# Patient Record
Sex: Male | Born: 1950 | Race: White | Hispanic: No | Smoking: Former smoker
Health system: Southern US, Community
[De-identification: ages and names within clinical notes are randomized; demographics above are authoritative.]

## PROBLEM LIST (undated history)

## (undated) DIAGNOSIS — R471 Dysarthria and anarthria: Secondary | ICD-10-CM

## (undated) DIAGNOSIS — M549 Dorsalgia, unspecified: Secondary | ICD-10-CM

## (undated) DIAGNOSIS — R55 Syncope and collapse: Secondary | ICD-10-CM

## (undated) DIAGNOSIS — R0602 Shortness of breath: Secondary | ICD-10-CM

## (undated) DIAGNOSIS — M5137 Other intervertebral disc degeneration, lumbosacral region: Secondary | ICD-10-CM

## (undated) DIAGNOSIS — G4761 Periodic limb movement disorder: Secondary | ICD-10-CM

## (undated) DIAGNOSIS — F1010 Alcohol abuse, uncomplicated: Secondary | ICD-10-CM

## (undated) DIAGNOSIS — F17200 Nicotine dependence, unspecified, uncomplicated: Secondary | ICD-10-CM

## (undated) DIAGNOSIS — IMO0002 Reserved for concepts with insufficient information to code with codable children: Secondary | ICD-10-CM

## (undated) DIAGNOSIS — M51379 Other intervertebral disc degeneration, lumbosacral region without mention of lumbar back pain or lower extremity pain: Secondary | ICD-10-CM

## (undated) DIAGNOSIS — Z950 Presence of cardiac pacemaker: Secondary | ICD-10-CM

## (undated) DIAGNOSIS — I5042 Chronic combined systolic (congestive) and diastolic (congestive) heart failure: Secondary | ICD-10-CM

## (undated) DIAGNOSIS — F411 Generalized anxiety disorder: Secondary | ICD-10-CM

## (undated) DIAGNOSIS — R270 Ataxia, unspecified: Secondary | ICD-10-CM

## (undated) DIAGNOSIS — L03116 Cellulitis of left lower limb: Secondary | ICD-10-CM

## (undated) DIAGNOSIS — I2510 Atherosclerotic heart disease of native coronary artery without angina pectoris: Secondary | ICD-10-CM

## (undated) DIAGNOSIS — I219 Acute myocardial infarction, unspecified: Secondary | ICD-10-CM

## (undated) DIAGNOSIS — I714 Abdominal aortic aneurysm, without rupture, unspecified: Secondary | ICD-10-CM

## (undated) DIAGNOSIS — E7800 Pure hypercholesterolemia, unspecified: Secondary | ICD-10-CM

## (undated) DIAGNOSIS — J309 Allergic rhinitis, unspecified: Secondary | ICD-10-CM

## (undated) DIAGNOSIS — J449 Chronic obstructive pulmonary disease, unspecified: Secondary | ICD-10-CM

## (undated) DIAGNOSIS — R011 Cardiac murmur, unspecified: Secondary | ICD-10-CM

## (undated) DIAGNOSIS — F319 Bipolar disorder, unspecified: Secondary | ICD-10-CM

## (undated) DIAGNOSIS — Z95810 Presence of automatic (implantable) cardiac defibrillator: Secondary | ICD-10-CM

## (undated) DIAGNOSIS — Z951 Presence of aortocoronary bypass graft: Secondary | ICD-10-CM

## (undated) DIAGNOSIS — I472 Ventricular tachycardia: Secondary | ICD-10-CM

## (undated) DIAGNOSIS — Z8719 Personal history of other diseases of the digestive system: Secondary | ICD-10-CM

## (undated) DIAGNOSIS — R569 Unspecified convulsions: Secondary | ICD-10-CM

## (undated) DIAGNOSIS — G8929 Other chronic pain: Secondary | ICD-10-CM

## (undated) DIAGNOSIS — I5040 Unspecified combined systolic (congestive) and diastolic (congestive) heart failure: Secondary | ICD-10-CM

## (undated) DIAGNOSIS — M5136 Other intervertebral disc degeneration, lumbar region: Secondary | ICD-10-CM

## (undated) DIAGNOSIS — K219 Gastro-esophageal reflux disease without esophagitis: Secondary | ICD-10-CM

## (undated) DIAGNOSIS — I10 Essential (primary) hypertension: Secondary | ICD-10-CM

## (undated) DIAGNOSIS — Z8679 Personal history of other diseases of the circulatory system: Secondary | ICD-10-CM

## (undated) DIAGNOSIS — G4710 Hypersomnia, unspecified: Secondary | ICD-10-CM

## (undated) DIAGNOSIS — R42 Dizziness and giddiness: Secondary | ICD-10-CM

## (undated) DIAGNOSIS — F1410 Cocaine abuse, uncomplicated: Secondary | ICD-10-CM

## (undated) DIAGNOSIS — I639 Cerebral infarction, unspecified: Secondary | ICD-10-CM

## (undated) DIAGNOSIS — M51369 Other intervertebral disc degeneration, lumbar region without mention of lumbar back pain or lower extremity pain: Secondary | ICD-10-CM

## (undated) DIAGNOSIS — I252 Old myocardial infarction: Secondary | ICD-10-CM

## (undated) HISTORY — DX: Atherosclerotic heart disease of native coronary artery without angina pectoris: I25.10

## (undated) HISTORY — DX: Nicotine dependence, unspecified, uncomplicated: F17.200

## (undated) HISTORY — DX: Dorsalgia, unspecified: M54.9

## (undated) HISTORY — DX: Personal history of other diseases of the digestive system: Z87.19

## (undated) HISTORY — DX: Ataxia, unspecified: R27.0

## (undated) HISTORY — DX: Other intervertebral disc degeneration, lumbar region without mention of lumbar back pain or lower extremity pain: M51.369

## (undated) HISTORY — DX: Essential (primary) hypertension: I10

## (undated) HISTORY — DX: Other intervertebral disc degeneration, lumbar region: M51.36

## (undated) HISTORY — DX: Unspecified convulsions: R56.9

## (undated) HISTORY — DX: Generalized anxiety disorder: F41.1

## (undated) HISTORY — DX: Acute myocardial infarction, unspecified: I21.9

## (undated) HISTORY — DX: Cerebral infarction, unspecified: I63.9

## (undated) HISTORY — PX: BI-VENTRICULAR IMPLANTABLE CARDIOVERTER DEFIBRILLATOR UPGRADE: SHX5749

## (undated) HISTORY — DX: Old myocardial infarction: I25.2

## (undated) HISTORY — DX: Allergic rhinitis, unspecified: J30.9

## (undated) HISTORY — DX: Cocaine abuse, uncomplicated: F14.10

## (undated) HISTORY — DX: Pure hypercholesterolemia, unspecified: E78.00

## (undated) HISTORY — DX: Periodic limb movement disorder: G47.61

## (undated) HISTORY — DX: Dizziness and giddiness: R42

## (undated) HISTORY — DX: Other intervertebral disc degeneration, lumbosacral region: M51.37

## (undated) HISTORY — PX: CERVICAL FUSION: SHX112

## (undated) HISTORY — DX: Ventricular tachycardia: I47.2

## (undated) HISTORY — DX: Alcohol abuse, uncomplicated: F10.10

## (undated) HISTORY — DX: Syncope and collapse: R55

## (undated) HISTORY — PX: LUMBAR FUSION: SHX111

## (undated) HISTORY — DX: Other intervertebral disc degeneration, lumbosacral region without mention of lumbar back pain or lower extremity pain: M51.379

## (undated) HISTORY — DX: Abdominal aortic aneurysm, without rupture: I71.4

## (undated) HISTORY — DX: Reserved for concepts with insufficient information to code with codable children: IMO0002

## (undated) HISTORY — PX: BACK SURGERY: SHX140

## (undated) HISTORY — PX: CARDIAC CATHETERIZATION: SHX172

## (undated) HISTORY — DX: Abdominal aortic aneurysm, without rupture, unspecified: I71.40

## (undated) HISTORY — DX: Presence of automatic (implantable) cardiac defibrillator: Z95.810

## (undated) HISTORY — DX: Hypersomnia, unspecified: G47.10

## (undated) HISTORY — DX: Presence of aortocoronary bypass graft: Z95.1

## (undated) HISTORY — DX: Bipolar disorder, unspecified: F31.9

## (undated) HISTORY — DX: Dysarthria and anarthria: R47.1

## (undated) HISTORY — DX: Other chronic pain: G89.29

## (undated) HISTORY — DX: Personal history of other diseases of the circulatory system: Z86.79

---

## 1994-10-11 DIAGNOSIS — Z951 Presence of aortocoronary bypass graft: Secondary | ICD-10-CM

## 1994-10-11 HISTORY — DX: Presence of aortocoronary bypass graft: Z95.1

## 2002-09-15 ENCOUNTER — Emergency Department (HOSPITAL_COMMUNITY): Admission: EM | Admit: 2002-09-15 | Discharge: 2002-09-15 | Payer: Self-pay | Admitting: Emergency Medicine

## 2003-08-02 ENCOUNTER — Encounter: Payer: Self-pay | Admitting: Emergency Medicine

## 2003-08-02 ENCOUNTER — Emergency Department (HOSPITAL_COMMUNITY): Admission: EM | Admit: 2003-08-02 | Discharge: 2003-08-02 | Payer: Self-pay | Admitting: Emergency Medicine

## 2005-10-11 HISTORY — PX: CORONARY ARTERY BYPASS GRAFT: SHX141

## 2006-02-25 ENCOUNTER — Encounter: Payer: Self-pay | Admitting: Vascular Surgery

## 2006-02-25 ENCOUNTER — Emergency Department (HOSPITAL_COMMUNITY): Admission: EM | Admit: 2006-02-25 | Discharge: 2006-02-25 | Payer: Self-pay | Admitting: Emergency Medicine

## 2006-03-01 ENCOUNTER — Emergency Department (HOSPITAL_COMMUNITY): Admission: EM | Admit: 2006-03-01 | Discharge: 2006-03-02 | Payer: Self-pay | Admitting: Emergency Medicine

## 2006-05-13 ENCOUNTER — Ambulatory Visit: Payer: Self-pay | Admitting: *Deleted

## 2006-05-13 ENCOUNTER — Inpatient Hospital Stay (HOSPITAL_COMMUNITY): Admission: EM | Admit: 2006-05-13 | Discharge: 2006-05-18 | Payer: Self-pay | Admitting: Emergency Medicine

## 2006-05-14 HISTORY — PX: CARDIAC CATHETERIZATION: SHX172

## 2006-05-16 ENCOUNTER — Encounter: Payer: Self-pay | Admitting: Cardiology

## 2006-05-17 ENCOUNTER — Ambulatory Visit: Payer: Self-pay | Admitting: Internal Medicine

## 2006-05-23 ENCOUNTER — Ambulatory Visit: Payer: Self-pay | Admitting: Internal Medicine

## 2006-06-07 ENCOUNTER — Ambulatory Visit: Payer: Self-pay | Admitting: Cardiology

## 2006-06-10 ENCOUNTER — Ambulatory Visit: Payer: Self-pay | Admitting: Cardiology

## 2006-06-10 ENCOUNTER — Encounter (HOSPITAL_COMMUNITY): Admission: RE | Admit: 2006-06-10 | Discharge: 2006-06-22 | Payer: Self-pay | Admitting: Cardiology

## 2006-06-13 ENCOUNTER — Emergency Department (HOSPITAL_COMMUNITY): Admission: EM | Admit: 2006-06-13 | Discharge: 2006-06-13 | Payer: Self-pay | Admitting: Emergency Medicine

## 2006-06-16 ENCOUNTER — Ambulatory Visit: Payer: Self-pay | Admitting: Cardiology

## 2006-06-16 ENCOUNTER — Ambulatory Visit: Payer: Self-pay | Admitting: Internal Medicine

## 2006-06-20 ENCOUNTER — Ambulatory Visit: Payer: Self-pay | Admitting: Internal Medicine

## 2006-06-20 ENCOUNTER — Inpatient Hospital Stay (HOSPITAL_COMMUNITY): Admission: EM | Admit: 2006-06-20 | Discharge: 2006-06-22 | Payer: Self-pay | Admitting: Emergency Medicine

## 2006-06-23 ENCOUNTER — Ambulatory Visit: Payer: Self-pay | Admitting: *Deleted

## 2006-06-30 ENCOUNTER — Ambulatory Visit: Payer: Self-pay | Admitting: Family Medicine

## 2006-07-12 ENCOUNTER — Ambulatory Visit: Payer: Self-pay | Admitting: Cardiology

## 2006-11-08 ENCOUNTER — Ambulatory Visit: Payer: Self-pay | Admitting: Family Medicine

## 2006-12-09 ENCOUNTER — Ambulatory Visit: Payer: Self-pay | Admitting: *Deleted

## 2007-01-06 ENCOUNTER — Ambulatory Visit: Payer: Self-pay | Admitting: Family Medicine

## 2007-03-15 ENCOUNTER — Ambulatory Visit: Payer: Self-pay | Admitting: Family Medicine

## 2007-03-15 LAB — CONVERTED CEMR LAB: PSA: 0.41 ng/mL

## 2007-03-16 ENCOUNTER — Encounter (INDEPENDENT_AMBULATORY_CARE_PROVIDER_SITE_OTHER): Payer: Self-pay | Admitting: Family Medicine

## 2007-03-16 LAB — CONVERTED CEMR LAB: RBC count: 5.29 10*6/uL

## 2007-03-27 ENCOUNTER — Ambulatory Visit (HOSPITAL_COMMUNITY): Admission: RE | Admit: 2007-03-27 | Discharge: 2007-03-27 | Payer: Self-pay | Admitting: Family Medicine

## 2007-05-03 ENCOUNTER — Emergency Department (HOSPITAL_COMMUNITY): Admission: EM | Admit: 2007-05-03 | Discharge: 2007-05-03 | Payer: Self-pay | Admitting: Family Medicine

## 2007-05-08 ENCOUNTER — Emergency Department (HOSPITAL_COMMUNITY): Admission: EM | Admit: 2007-05-08 | Discharge: 2007-05-08 | Payer: Self-pay | Admitting: Emergency Medicine

## 2007-05-09 ENCOUNTER — Ambulatory Visit: Payer: Self-pay | Admitting: Family Medicine

## 2007-06-08 ENCOUNTER — Encounter: Payer: Self-pay | Admitting: Family Medicine

## 2007-06-08 DIAGNOSIS — M51379 Other intervertebral disc degeneration, lumbosacral region without mention of lumbar back pain or lower extremity pain: Secondary | ICD-10-CM | POA: Insufficient documentation

## 2007-06-08 DIAGNOSIS — M5137 Other intervertebral disc degeneration, lumbosacral region: Secondary | ICD-10-CM

## 2007-06-08 DIAGNOSIS — I2510 Atherosclerotic heart disease of native coronary artery without angina pectoris: Secondary | ICD-10-CM

## 2007-06-08 DIAGNOSIS — I10 Essential (primary) hypertension: Secondary | ICD-10-CM

## 2007-06-08 DIAGNOSIS — Z8719 Personal history of other diseases of the digestive system: Secondary | ICD-10-CM

## 2007-06-08 DIAGNOSIS — I252 Old myocardial infarction: Secondary | ICD-10-CM | POA: Insufficient documentation

## 2007-06-08 HISTORY — DX: Essential (primary) hypertension: I10

## 2007-06-08 HISTORY — DX: Old myocardial infarction: I25.2

## 2007-06-08 HISTORY — DX: Atherosclerotic heart disease of native coronary artery without angina pectoris: I25.10

## 2007-06-09 ENCOUNTER — Ambulatory Visit: Payer: Self-pay | Admitting: Family Medicine

## 2007-06-15 DIAGNOSIS — F17200 Nicotine dependence, unspecified, uncomplicated: Secondary | ICD-10-CM | POA: Insufficient documentation

## 2007-06-28 ENCOUNTER — Encounter (INDEPENDENT_AMBULATORY_CARE_PROVIDER_SITE_OTHER): Payer: Self-pay | Admitting: *Deleted

## 2007-07-20 ENCOUNTER — Emergency Department (HOSPITAL_COMMUNITY): Admission: EM | Admit: 2007-07-20 | Discharge: 2007-07-20 | Payer: Self-pay | Admitting: Emergency Medicine

## 2007-08-04 ENCOUNTER — Ambulatory Visit: Payer: Self-pay | Admitting: Family Medicine

## 2007-09-04 ENCOUNTER — Ambulatory Visit: Payer: Self-pay | Admitting: Family Medicine

## 2007-09-29 ENCOUNTER — Ambulatory Visit: Payer: Self-pay | Admitting: Internal Medicine

## 2007-10-18 ENCOUNTER — Inpatient Hospital Stay (HOSPITAL_COMMUNITY): Admission: EM | Admit: 2007-10-18 | Discharge: 2007-10-21 | Payer: Self-pay | Admitting: Emergency Medicine

## 2007-10-18 ENCOUNTER — Ambulatory Visit: Payer: Self-pay | Admitting: Cardiology

## 2007-10-23 ENCOUNTER — Ambulatory Visit: Payer: Self-pay | Admitting: Internal Medicine

## 2007-10-23 ENCOUNTER — Ambulatory Visit (HOSPITAL_COMMUNITY): Admission: RE | Admit: 2007-10-23 | Discharge: 2007-10-24 | Payer: Self-pay | Admitting: Internal Medicine

## 2007-10-23 HISTORY — PX: CARDIAC DEFIBRILLATOR PLACEMENT: SHX171

## 2007-11-06 ENCOUNTER — Inpatient Hospital Stay (HOSPITAL_COMMUNITY): Admission: EM | Admit: 2007-11-06 | Discharge: 2007-11-08 | Payer: Self-pay | Admitting: Emergency Medicine

## 2007-11-06 ENCOUNTER — Ambulatory Visit: Payer: Self-pay | Admitting: Cardiology

## 2007-11-07 ENCOUNTER — Encounter: Payer: Self-pay | Admitting: Cardiology

## 2007-11-08 ENCOUNTER — Encounter: Payer: Self-pay | Admitting: Internal Medicine

## 2007-11-17 ENCOUNTER — Ambulatory Visit: Payer: Self-pay | Admitting: Cardiology

## 2007-11-23 ENCOUNTER — Ambulatory Visit: Payer: Self-pay | Admitting: Family Medicine

## 2007-12-07 ENCOUNTER — Inpatient Hospital Stay (HOSPITAL_COMMUNITY): Admission: EM | Admit: 2007-12-07 | Discharge: 2007-12-10 | Payer: Self-pay | Admitting: Emergency Medicine

## 2007-12-07 ENCOUNTER — Ambulatory Visit: Payer: Self-pay | Admitting: Cardiology

## 2007-12-13 ENCOUNTER — Ambulatory Visit: Payer: Self-pay | Admitting: Cardiology

## 2007-12-13 LAB — CONVERTED CEMR LAB
BUN: 15 mg/dL (ref 6–23)
Calcium: 9.4 mg/dL (ref 8.4–10.5)
Eosinophils Absolute: 0.4 10*3/uL (ref 0.0–0.6)
GFR calc Af Amer: 90 mL/min
GFR calc non Af Amer: 74 mL/min
Lymphocytes Relative: 29.1 % (ref 12.0–46.0)
MCV: 91.6 fL (ref 78.0–100.0)
Monocytes Relative: 6.1 % (ref 3.0–11.0)
Neutro Abs: 5.2 10*3/uL (ref 1.4–7.7)
Platelets: 232 10*3/uL (ref 150–400)
RBC: 5.08 M/uL (ref 4.22–5.81)

## 2007-12-15 ENCOUNTER — Ambulatory Visit: Payer: Self-pay | Admitting: Family Medicine

## 2008-01-11 ENCOUNTER — Ambulatory Visit: Payer: Self-pay | Admitting: Family Medicine

## 2008-01-22 ENCOUNTER — Ambulatory Visit: Payer: Self-pay | Admitting: Cardiology

## 2008-01-22 LAB — CONVERTED CEMR LAB
ALT: 14 units/L (ref 0–53)
BUN: 4 mg/dL — ABNORMAL LOW (ref 6–23)
Bilirubin, Direct: 0.1 mg/dL (ref 0.0–0.3)
CO2: 28 meq/L (ref 19–32)
Calcium: 9.2 mg/dL (ref 8.4–10.5)
Cholesterol: 171 mg/dL (ref 0–200)
Creatinine, Ser: 0.9 mg/dL (ref 0.4–1.5)
Glucose, Bld: 78 mg/dL (ref 70–99)
HDL: 35.2 mg/dL — ABNORMAL LOW (ref >39.0)
Total Protein: 6.1 g/dL (ref 6.0–8.3)
Triglycerides: 62 mg/dL (ref 0–149)

## 2008-02-01 ENCOUNTER — Ambulatory Visit: Payer: Self-pay | Admitting: Internal Medicine

## 2008-02-09 ENCOUNTER — Encounter (INDEPENDENT_AMBULATORY_CARE_PROVIDER_SITE_OTHER): Payer: Self-pay | Admitting: Family Medicine

## 2008-02-09 ENCOUNTER — Ambulatory Visit: Payer: Self-pay | Admitting: Internal Medicine

## 2008-02-09 LAB — CONVERTED CEMR LAB
Benzodiazepines.: NEGATIVE
Creatinine,U: 100.8 mg/dL
Opiate Screen, Urine: NEGATIVE
Phencyclidine (PCP): NEGATIVE
Propoxyphene: NEGATIVE

## 2008-02-15 ENCOUNTER — Ambulatory Visit: Payer: Self-pay | Admitting: Pulmonary Disease

## 2008-02-15 DIAGNOSIS — G4710 Hypersomnia, unspecified: Secondary | ICD-10-CM | POA: Insufficient documentation

## 2008-02-15 DIAGNOSIS — R55 Syncope and collapse: Secondary | ICD-10-CM

## 2008-02-22 ENCOUNTER — Ambulatory Visit: Payer: Self-pay | Admitting: Pulmonary Disease

## 2008-02-22 ENCOUNTER — Ambulatory Visit (HOSPITAL_BASED_OUTPATIENT_CLINIC_OR_DEPARTMENT_OTHER): Admission: RE | Admit: 2008-02-22 | Discharge: 2008-02-22 | Payer: Self-pay | Admitting: Pulmonary Disease

## 2008-02-28 ENCOUNTER — Ambulatory Visit: Payer: Self-pay | Admitting: Pulmonary Disease

## 2008-02-28 DIAGNOSIS — G4761 Periodic limb movement disorder: Secondary | ICD-10-CM

## 2008-02-28 LAB — CONVERTED CEMR LAB
AST: 18 units/L (ref 0–37)
Alkaline Phosphatase: 96 units/L (ref 39–117)
Bilirubin, Direct: 0.1 mg/dL (ref 0.0–0.3)
Chloride: 106 meq/L (ref 96–112)
Ferritin: 29.2 ng/mL (ref 22.0–322.0)
Folate: 5.9 ng/mL
GFR calc non Af Amer: 83 mL/min
Hemoglobin: 14.9 g/dL (ref 13.0–17.0)
Lymphocytes Relative: 17.4 % (ref 12.0–46.0)
Monocytes Relative: 3.9 % (ref 3.0–12.0)
Neutro Abs: 6.5 10*3/uL (ref 1.4–7.7)
Potassium: 3.4 meq/L — ABNORMAL LOW (ref 3.5–5.1)
RDW: 12.4 % (ref 11.5–14.6)
Sodium: 141 meq/L (ref 135–145)
TSH: 0.7 microintl units/mL (ref 0.35–5.50)
Total Bilirubin: 0.6 mg/dL (ref 0.3–1.2)
Vitamin B-12: 145 pg/mL — ABNORMAL LOW (ref 211–911)

## 2008-03-08 ENCOUNTER — Ambulatory Visit: Payer: Self-pay | Admitting: Family Medicine

## 2008-03-08 LAB — CONVERTED CEMR LAB
Marijuana Metabolite: NEGATIVE
Methadone: NEGATIVE
Opiate Screen, Urine: NEGATIVE
Propoxyphene: NEGATIVE

## 2008-03-19 ENCOUNTER — Ambulatory Visit: Payer: Self-pay | Admitting: Internal Medicine

## 2008-03-25 ENCOUNTER — Ambulatory Visit: Payer: Self-pay | Admitting: Cardiology

## 2008-03-25 ENCOUNTER — Inpatient Hospital Stay (HOSPITAL_COMMUNITY): Admission: EM | Admit: 2008-03-25 | Discharge: 2008-03-27 | Payer: Self-pay | Admitting: Emergency Medicine

## 2008-03-26 ENCOUNTER — Encounter: Payer: Self-pay | Admitting: Cardiology

## 2008-03-26 ENCOUNTER — Ambulatory Visit: Payer: Self-pay | Admitting: Vascular Surgery

## 2008-04-03 ENCOUNTER — Encounter (INDEPENDENT_AMBULATORY_CARE_PROVIDER_SITE_OTHER): Payer: Self-pay | Admitting: Family Medicine

## 2008-04-03 ENCOUNTER — Ambulatory Visit: Payer: Self-pay | Admitting: Internal Medicine

## 2008-04-03 LAB — CONVERTED CEMR LAB
Barbiturate Quant, Ur: NEGATIVE
Cocaine Metabolites: NEGATIVE
Creatinine,U: 137.3 mg/dL
Marijuana Metabolite: NEGATIVE
Opiate Screen, Urine: NEGATIVE
Propoxyphene: NEGATIVE

## 2008-05-02 ENCOUNTER — Ambulatory Visit: Payer: Self-pay | Admitting: Cardiology

## 2008-05-07 ENCOUNTER — Encounter (INDEPENDENT_AMBULATORY_CARE_PROVIDER_SITE_OTHER): Payer: Self-pay | Admitting: Family Medicine

## 2008-05-07 ENCOUNTER — Ambulatory Visit: Payer: Self-pay | Admitting: Internal Medicine

## 2008-05-07 LAB — CONVERTED CEMR LAB
Amphetamine Screen, Ur: NEGATIVE
Barbiturate Quant, Ur: NEGATIVE
Methadone: NEGATIVE
Opiate Screen, Urine: NEGATIVE

## 2008-06-14 ENCOUNTER — Emergency Department (HOSPITAL_COMMUNITY): Admission: EM | Admit: 2008-06-14 | Discharge: 2008-06-14 | Payer: Self-pay | Admitting: Emergency Medicine

## 2008-08-22 ENCOUNTER — Emergency Department (HOSPITAL_COMMUNITY): Admission: EM | Admit: 2008-08-22 | Discharge: 2008-08-22 | Payer: Self-pay | Admitting: Emergency Medicine

## 2008-08-23 ENCOUNTER — Ambulatory Visit: Payer: Self-pay | Admitting: Family Medicine

## 2008-08-23 LAB — CONVERTED CEMR LAB
Barbiturate Quant, Ur: NEGATIVE
Benzodiazepines.: NEGATIVE
Marijuana Metabolite: NEGATIVE
Methadone: NEGATIVE
Propoxyphene: NEGATIVE

## 2008-08-29 ENCOUNTER — Ambulatory Visit: Payer: Self-pay | Admitting: Cardiology

## 2008-08-29 LAB — CONVERTED CEMR LAB
Basophils Absolute: 0 10*3/uL (ref 0.0–0.1)
Calcium: 9.3 mg/dL (ref 8.4–10.5)
Eosinophils Absolute: 0.4 10*3/uL (ref 0.0–0.7)
GFR calc Af Amer: 100 mL/min
GFR calc non Af Amer: 82 mL/min
HCT: 43.5 % (ref 39.0–52.0)
INR: 0.9 (ref 0.8–1.0)
MCHC: 34.1 g/dL (ref 30.0–36.0)
MCV: 92.7 fL (ref 78.0–100.0)
Monocytes Absolute: 0.5 10*3/uL (ref 0.1–1.0)
Neutro Abs: 6.5 10*3/uL (ref 1.4–7.7)
Platelets: 236 10*3/uL (ref 150–400)
Potassium: 3.6 meq/L (ref 3.5–5.1)
RDW: 12.5 % (ref 11.5–14.6)
Sodium: 138 meq/L (ref 135–145)

## 2008-09-03 ENCOUNTER — Ambulatory Visit (HOSPITAL_COMMUNITY): Admission: RE | Admit: 2008-09-03 | Discharge: 2008-09-03 | Payer: Self-pay | Admitting: Cardiology

## 2008-09-03 ENCOUNTER — Ambulatory Visit: Payer: Self-pay | Admitting: Cardiology

## 2008-09-03 HISTORY — PX: CARDIAC CATHETERIZATION: SHX172

## 2008-10-21 ENCOUNTER — Ambulatory Visit: Payer: Self-pay | Admitting: Internal Medicine

## 2008-10-21 ENCOUNTER — Inpatient Hospital Stay (HOSPITAL_COMMUNITY): Admission: EM | Admit: 2008-10-21 | Discharge: 2008-10-23 | Payer: Self-pay | Admitting: Emergency Medicine

## 2008-10-25 ENCOUNTER — Ambulatory Visit: Payer: Self-pay | Admitting: Family Medicine

## 2009-01-12 ENCOUNTER — Emergency Department (HOSPITAL_COMMUNITY): Admission: EM | Admit: 2009-01-12 | Discharge: 2009-01-13 | Payer: Self-pay | Admitting: Emergency Medicine

## 2009-01-19 ENCOUNTER — Emergency Department (HOSPITAL_COMMUNITY): Admission: EM | Admit: 2009-01-19 | Discharge: 2009-01-20 | Payer: Self-pay | Admitting: *Deleted

## 2009-01-24 ENCOUNTER — Ambulatory Visit: Payer: Self-pay | Admitting: Family Medicine

## 2009-01-24 LAB — CONVERTED CEMR LAB
Cocaine Metabolites: POSITIVE — AB
Creatinine,U: 132.2 mg/dL
Phencyclidine (PCP): NEGATIVE
Propoxyphene: NEGATIVE

## 2009-02-04 ENCOUNTER — Inpatient Hospital Stay (HOSPITAL_COMMUNITY): Admission: EM | Admit: 2009-02-04 | Discharge: 2009-02-06 | Payer: Self-pay | Admitting: *Deleted

## 2009-02-04 ENCOUNTER — Encounter: Payer: Self-pay | Admitting: Cardiology

## 2009-02-04 ENCOUNTER — Ambulatory Visit: Payer: Self-pay | Admitting: Cardiology

## 2009-02-05 ENCOUNTER — Encounter: Payer: Self-pay | Admitting: Cardiology

## 2009-03-01 ENCOUNTER — Emergency Department (HOSPITAL_COMMUNITY): Admission: EM | Admit: 2009-03-01 | Discharge: 2009-03-01 | Payer: Self-pay | Admitting: Emergency Medicine

## 2009-03-04 ENCOUNTER — Ambulatory Visit: Payer: Self-pay | Admitting: Internal Medicine

## 2009-03-06 DIAGNOSIS — E7800 Pure hypercholesterolemia, unspecified: Secondary | ICD-10-CM

## 2009-03-06 DIAGNOSIS — M549 Dorsalgia, unspecified: Secondary | ICD-10-CM

## 2009-03-06 DIAGNOSIS — F319 Bipolar disorder, unspecified: Secondary | ICD-10-CM | POA: Insufficient documentation

## 2009-03-06 HISTORY — DX: Pure hypercholesterolemia, unspecified: E78.00

## 2009-03-19 ENCOUNTER — Ambulatory Visit: Payer: Self-pay | Admitting: Internal Medicine

## 2009-03-19 ENCOUNTER — Emergency Department (HOSPITAL_COMMUNITY): Admission: EM | Admit: 2009-03-19 | Discharge: 2009-03-19 | Payer: Self-pay | Admitting: Emergency Medicine

## 2009-04-01 ENCOUNTER — Emergency Department (HOSPITAL_COMMUNITY): Admission: EM | Admit: 2009-04-01 | Discharge: 2009-04-01 | Payer: Self-pay | Admitting: Emergency Medicine

## 2009-04-28 ENCOUNTER — Emergency Department (HOSPITAL_COMMUNITY): Admission: EM | Admit: 2009-04-28 | Discharge: 2009-04-28 | Payer: Self-pay | Admitting: Emergency Medicine

## 2009-04-30 ENCOUNTER — Encounter (INDEPENDENT_AMBULATORY_CARE_PROVIDER_SITE_OTHER): Payer: Self-pay | Admitting: *Deleted

## 2009-05-28 ENCOUNTER — Ambulatory Visit: Payer: Self-pay | Admitting: Cardiology

## 2009-05-28 ENCOUNTER — Inpatient Hospital Stay (HOSPITAL_COMMUNITY): Admission: EM | Admit: 2009-05-28 | Discharge: 2009-05-29 | Payer: Self-pay | Admitting: Emergency Medicine

## 2009-08-01 ENCOUNTER — Encounter (INDEPENDENT_AMBULATORY_CARE_PROVIDER_SITE_OTHER): Payer: Self-pay | Admitting: *Deleted

## 2009-08-12 ENCOUNTER — Encounter (INDEPENDENT_AMBULATORY_CARE_PROVIDER_SITE_OTHER): Payer: Self-pay | Admitting: *Deleted

## 2009-08-17 ENCOUNTER — Encounter: Payer: Self-pay | Admitting: Cardiology

## 2009-09-24 IMAGING — CT CT HEAD W/O CM
1 series · 16 of 30 positions shown, 20 images · IV contrast (agent unspecified)
Comparison: CT head dated 10/19/07, MRI brain dated 10/21/07.

CLINICAL DATA: Code stroke.  Left-sided pain. Slurred speech. 
HEAD CT WITHOUT CONTRAST:
TECHNIQUE: Contiguous axial images were obtained from the base of the skull through the vertex according to standard protocol without contrast.

[Series 2: brain · axial · 0.47mm/px · z∈[+97,+243]mm · 16 of 32 slices shown, 20 images]
[im 2/32  brain]
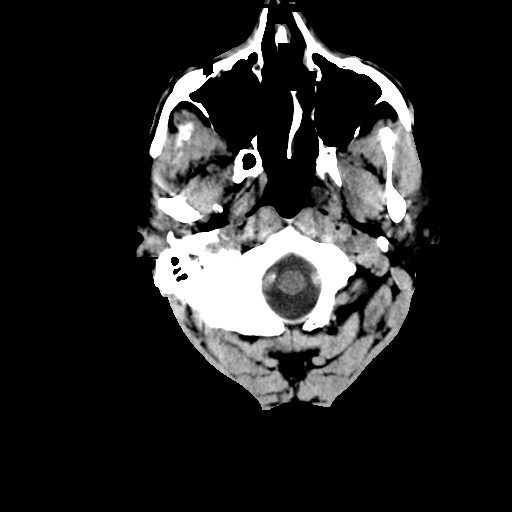
[im 2/32  bone]
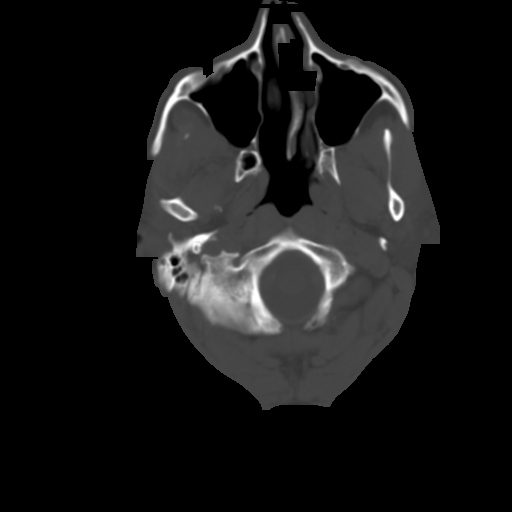
[im 4/32  brain]
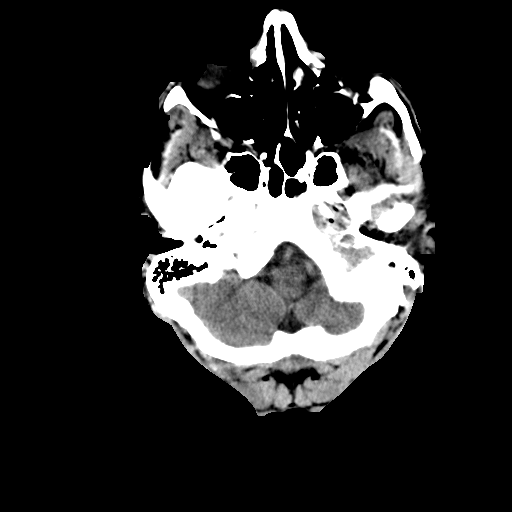
[im 6/32  brain]
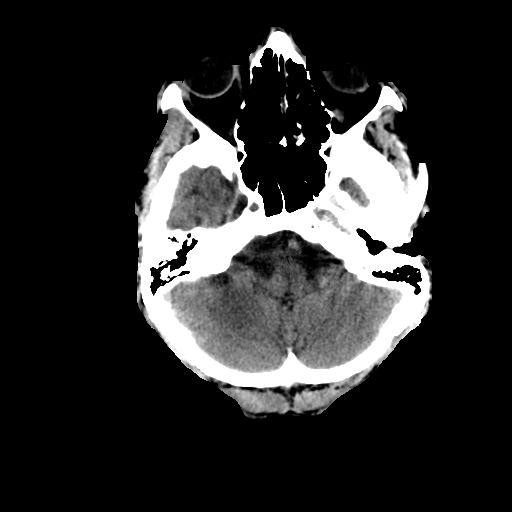
[im 8/32  brain]
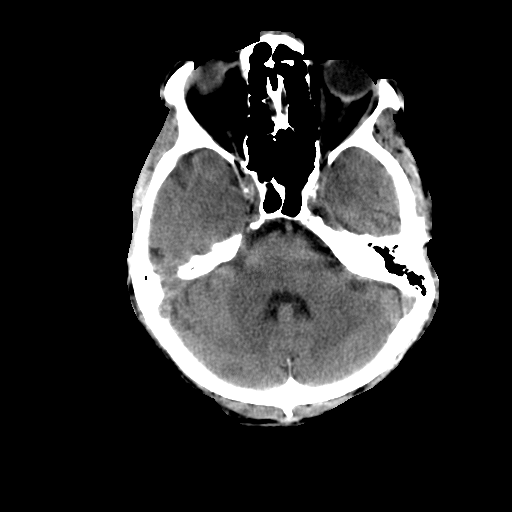
[im 9/32  brain]
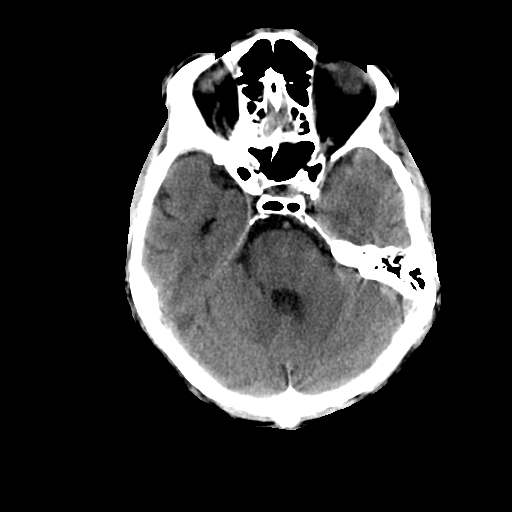
[im 9/32  bone]
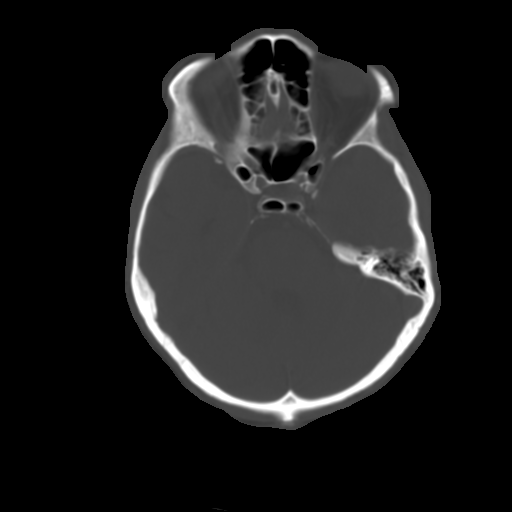
[im 11/32  brain]
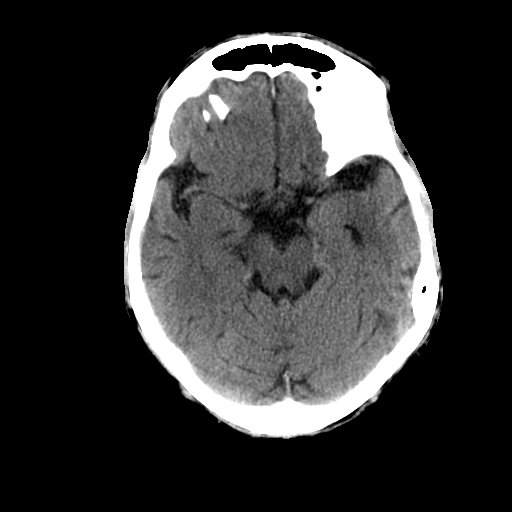
[im 13/32  brain]
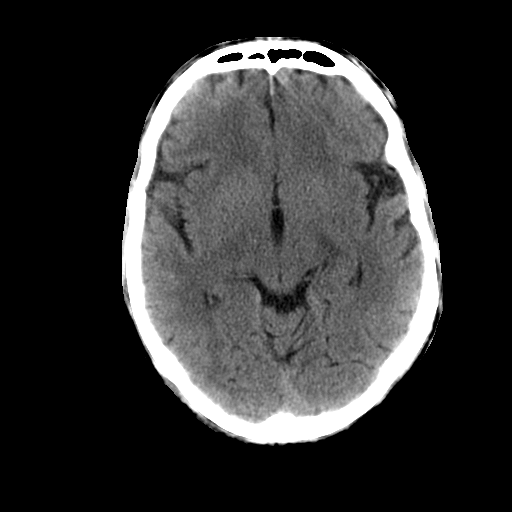
[im 15/32  brain]
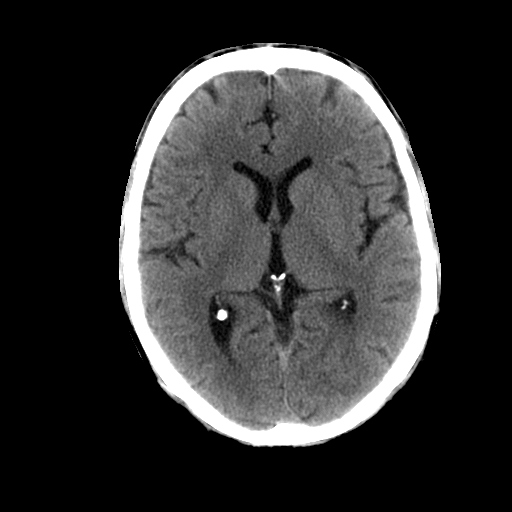
[im 17/32  brain]
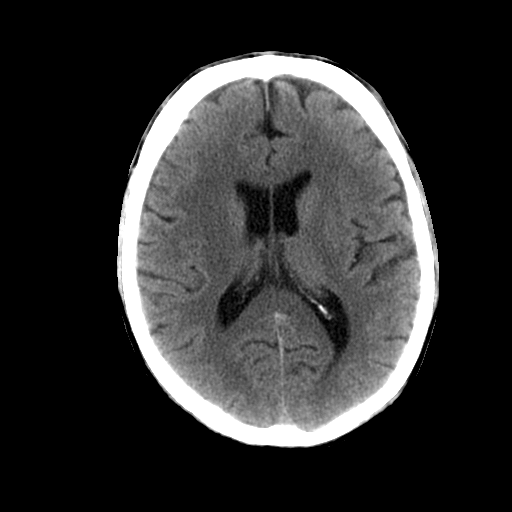
[im 17/32  bone]
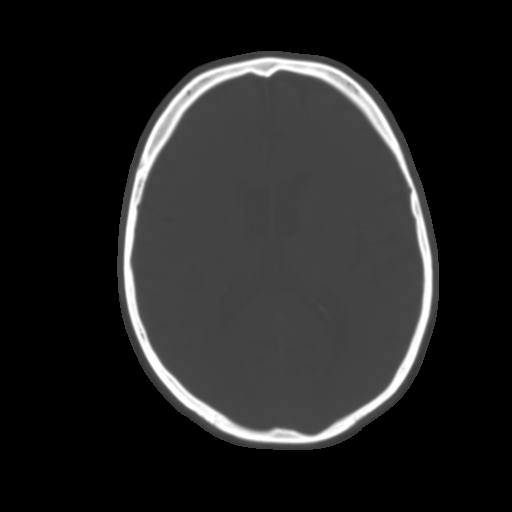
[im 19/32  brain]
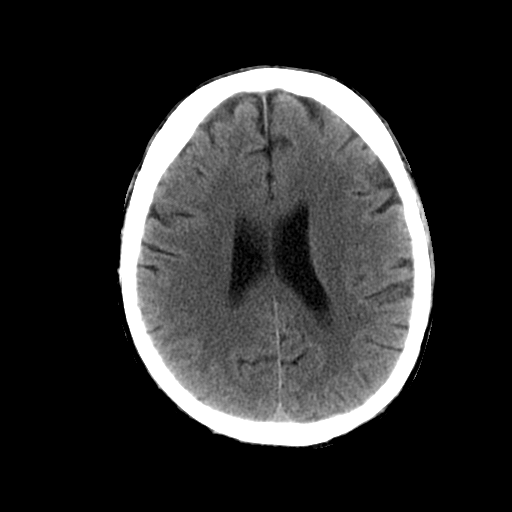
[im 21/32  brain]
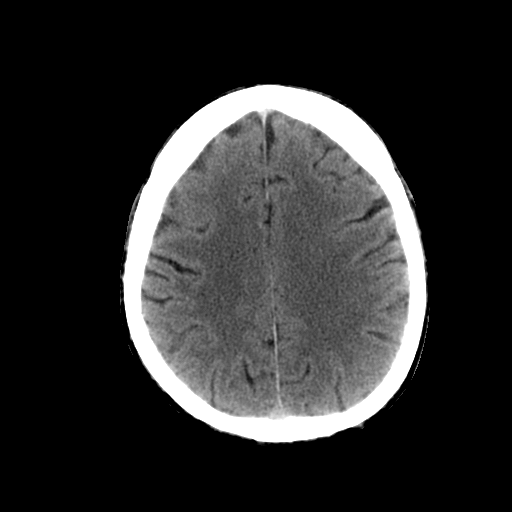
[im 23/32  brain]
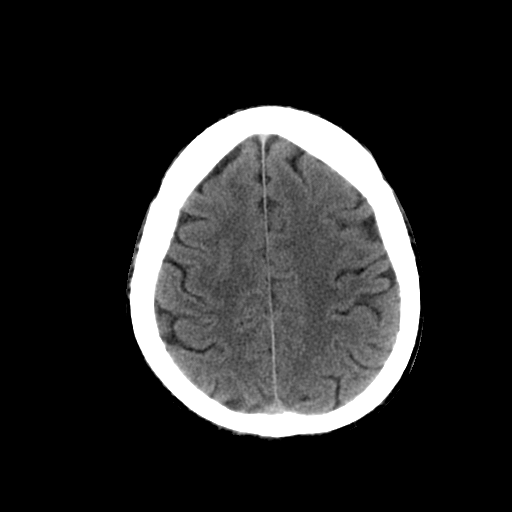
[im 24/32  brain]
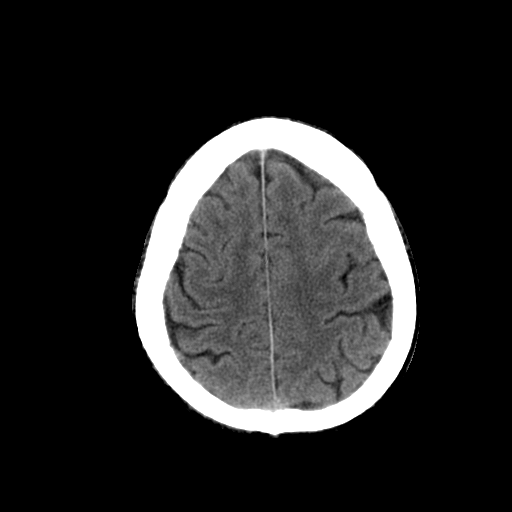
[im 24/32  bone]
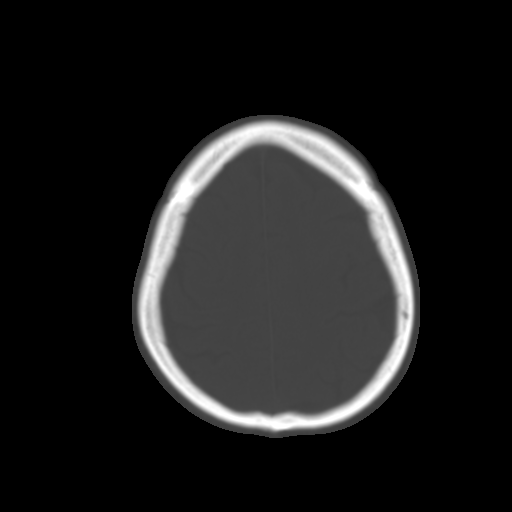
[im 26/32  brain]
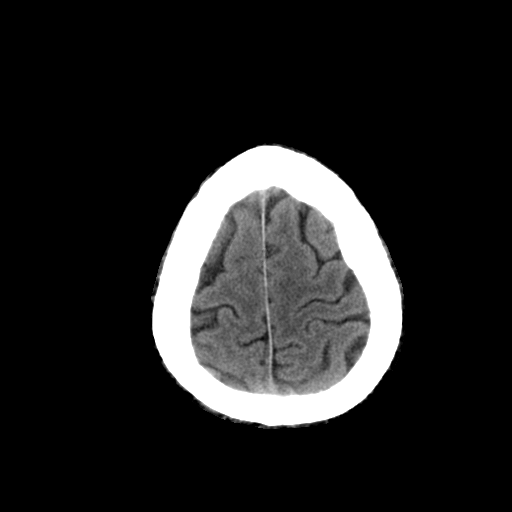
[im 28/32  brain]
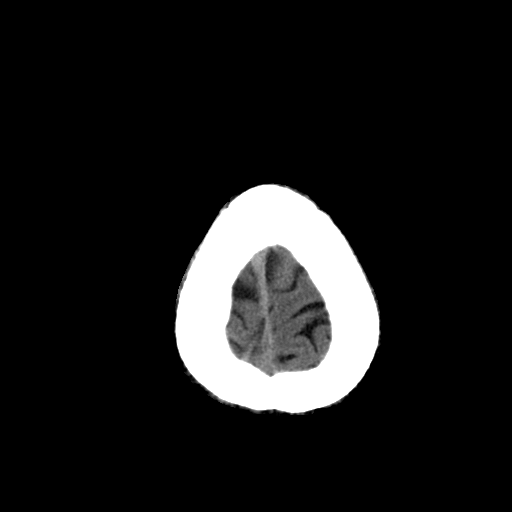
[im 30/32  brain]
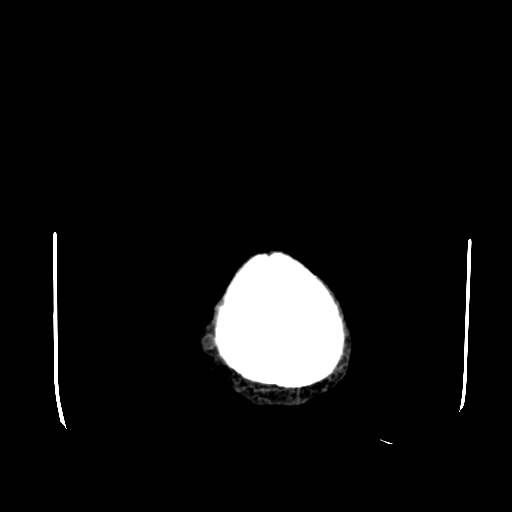

[16 of 30 positions shown; findings below may reference images not displayed]

FINDINGS: There is significant streak artifact at the skull base. Hypodensities in the mid brain and pons are felt to be due to this streak artifact/beam hardening. 
No extra-axial fluid collection or intraparenchymal hemorrhage.   The ventricles are normal in volume.  No midline shift or mass effect.  The basilar cisterns are patent. 
Gray/white differentiation is maintained.     
The paranasal sinuses and mastoid air cells are clear.  The orbits appear normal.
IMPRESSION: 1.   No clear CT evidence of acute infarction.  
2.   Low attenuation within the midbrain and pons is felt to be due artifact from skull base.   
3.  No evidence of hemorrhage.

## 2009-09-24 IMAGING — CR DG CHEST 1V PORT
1 series · 1 of 1 positions shown · non-contrast
Comparison: none

CLINICAL DATA: Code stroke, dizziness, shortness of breath.  
 PORTABLE CHEST - 1 VIEW ? 11/06/07:

[view not recorded]
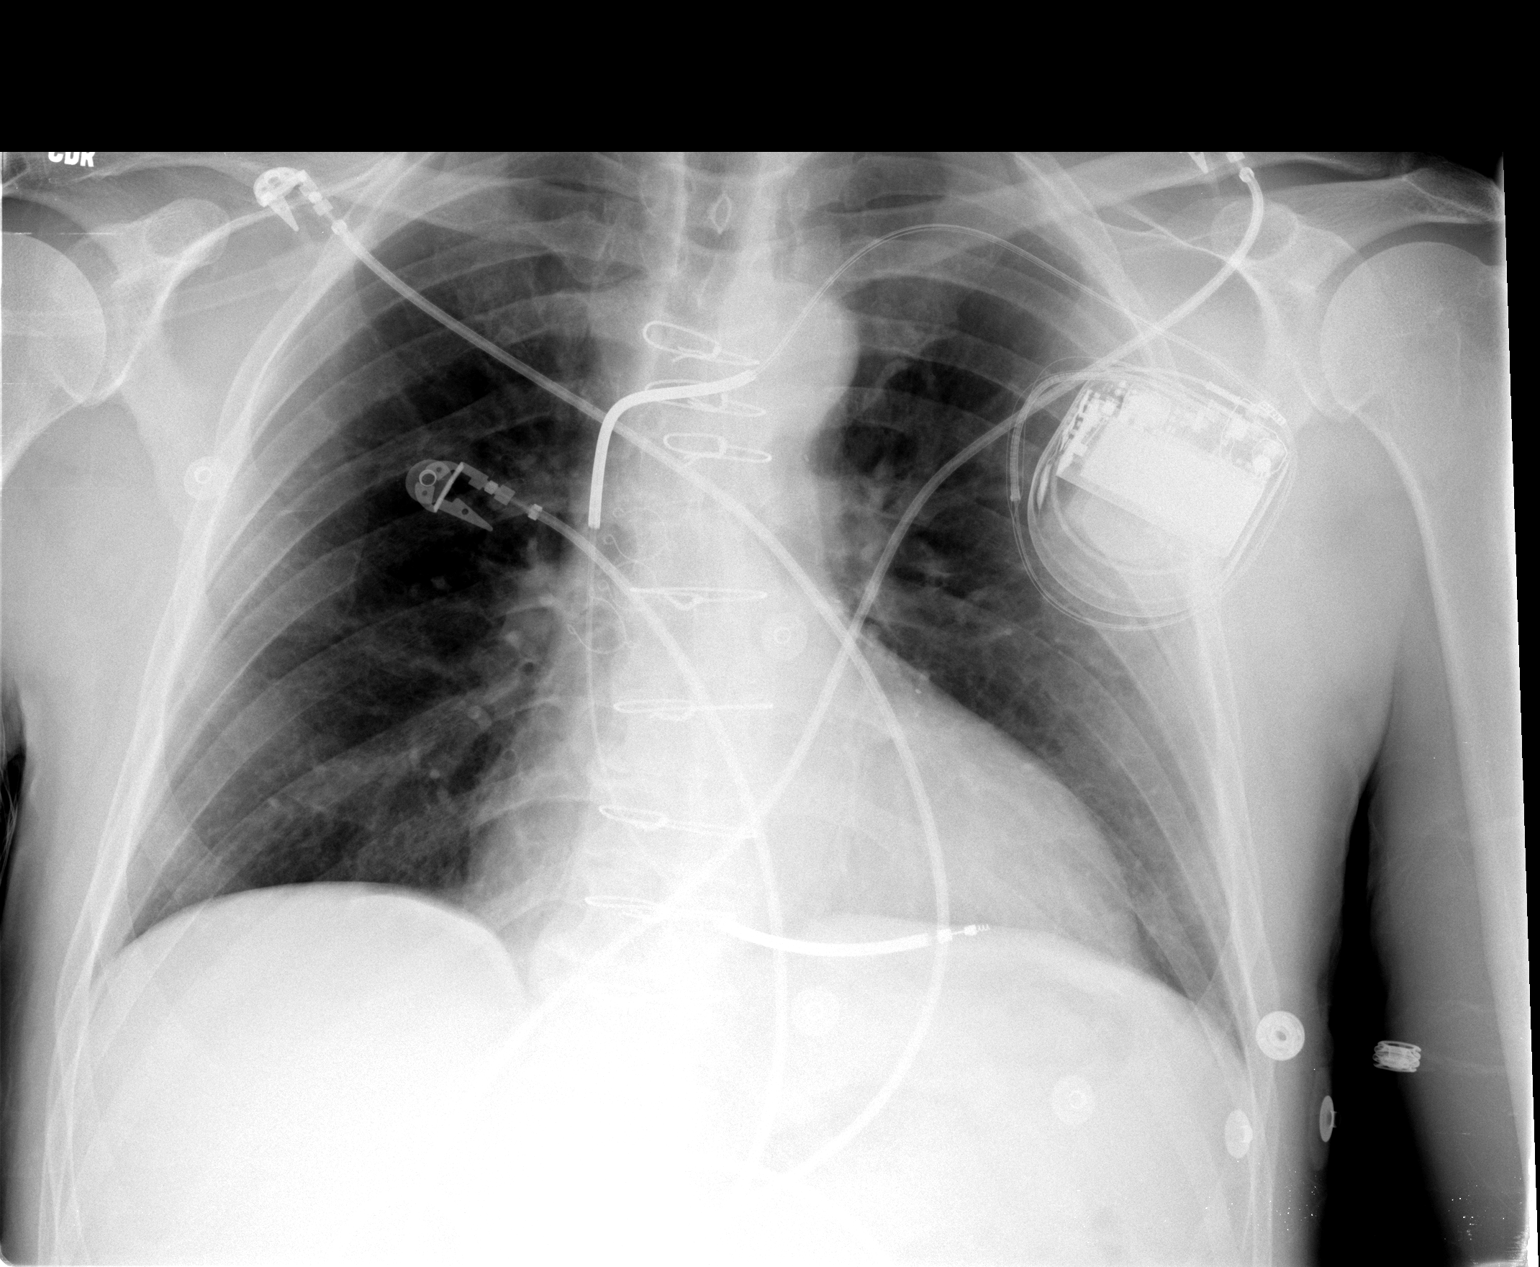

[1 of 1 positions shown; findings below may reference images not displayed]

FINDINGS: There is a single lead pacemaker overlying the left hemithorax.  Normal cardiac silhouette.  The costophrenic angles are clear.  No evidence of focal infiltrate.  Normal pulmonary vasculature.  No evidence of pneumothorax.
IMPRESSION: No acute cardiopulmonary process.

## 2010-05-22 ENCOUNTER — Encounter (INDEPENDENT_AMBULATORY_CARE_PROVIDER_SITE_OTHER): Payer: Self-pay | Admitting: *Deleted

## 2010-06-01 ENCOUNTER — Encounter: Payer: Self-pay | Admitting: Internal Medicine

## 2010-07-24 ENCOUNTER — Inpatient Hospital Stay (HOSPITAL_COMMUNITY): Admission: EM | Admit: 2010-07-24 | Discharge: 2010-07-26 | Payer: Self-pay | Admitting: Emergency Medicine

## 2010-08-07 ENCOUNTER — Telehealth: Payer: Self-pay | Admitting: Cardiology

## 2010-08-08 ENCOUNTER — Emergency Department (HOSPITAL_COMMUNITY): Admission: EM | Admit: 2010-08-08 | Discharge: 2010-08-08 | Payer: Self-pay | Admitting: Emergency Medicine

## 2010-10-09 ENCOUNTER — Encounter
Admission: RE | Admit: 2010-10-09 | Discharge: 2010-10-09 | Payer: Self-pay | Source: Home / Self Care | Attending: Orthopedic Surgery | Admitting: Orthopedic Surgery

## 2010-10-22 ENCOUNTER — Telehealth (INDEPENDENT_AMBULATORY_CARE_PROVIDER_SITE_OTHER): Payer: Self-pay | Admitting: *Deleted

## 2010-11-10 NOTE — Medication Information (Signed)
Summary: Zetia  Zetia   Imported By: Marylou Mccoy 06/12/2010 14:58:38  _____________________________________________________________________  External Attachment:    Type:   Image     Comment:   External Document

## 2010-11-10 NOTE — Progress Notes (Signed)
Summary: rn from [facility] cannot reach pt   Phone Note From Other Clinic   Caller: Nurse/cindy Request: Talk with Nurse, Talk with Provider Summary of Call: cindy tucker rn from [facility] has not been able to reach the pt. cindy has left several messages pt has not return the call. cindy# [phone]  Initial call taken by: Roe Coombs,  August 07, 2010 11:36 AM  Follow-up for Phone Call        I spoke with Arline Asp and she has spoken with the pt a few times about home care but cannot reach him now.  She said she thinks the pt blocked [facility] number.  Arline Asp said if the pt still needs home health care then we could contact them.  The pt has an upcoming appt on 08/12/10 with Dr Riley Kill. Follow-up by: Julieta Gutting, RN, BSN,  August 07, 2010 2:58 PM

## 2010-11-10 NOTE — Cardiovascular Report (Signed)
Summary: Certified Letter Delivered  EKG Report   Imported By: Debby Freiberg 08/11/2010 11:21:08  _____________________________________________________________________  External Attachment:    Type:   Image     Comment:   External Document

## 2010-11-10 NOTE — Letter (Signed)
Summary: Device-Delinquent Check  [facility], Main Office  [address]   [place], [place] [address]   Phone: [phone]  Fax: [phone]     May 22, 2010 MRN: [id number]   [name] [address] [place], [place]  [address]   Dear [name],  According to our records, you have not had your implanted device checked in the recommended period of time.  We are unable to determine appropriate device function without checking your device on a regular basis.  Please call our office to schedule an appointment, with Dr. Ladona Ridgel,  as soon as possible.  If you are having your device checked by another physician, please call us so that we may update our records.  Thank you, Altha Harm, LPN  May 22, 2010 3:25 PM   [facility]  certified mail

## 2010-11-11 ENCOUNTER — Emergency Department (HOSPITAL_COMMUNITY)
Admission: EM | Admit: 2010-11-11 | Discharge: 2010-11-12 | Disposition: A | Discharge status: Home / Self Care | Payer: Medicaid Other | Attending: Emergency Medicine | Admitting: Emergency Medicine

## 2010-11-11 ENCOUNTER — Emergency Department (HOSPITAL_COMMUNITY): Payer: Medicaid Other

## 2010-11-11 DIAGNOSIS — I2510 Atherosclerotic heart disease of native coronary artery without angina pectoris: Secondary | ICD-10-CM | POA: Insufficient documentation

## 2010-11-11 DIAGNOSIS — G40909 Epilepsy, unspecified, not intractable, without status epilepticus: Secondary | ICD-10-CM | POA: Insufficient documentation

## 2010-11-11 DIAGNOSIS — Z950 Presence of cardiac pacemaker: Secondary | ICD-10-CM | POA: Insufficient documentation

## 2010-11-11 DIAGNOSIS — IMO0002 Reserved for concepts with insufficient information to code with codable children: Secondary | ICD-10-CM | POA: Insufficient documentation

## 2010-11-11 DIAGNOSIS — M79609 Pain in unspecified limb: Secondary | ICD-10-CM | POA: Insufficient documentation

## 2010-11-11 DIAGNOSIS — F319 Bipolar disorder, unspecified: Secondary | ICD-10-CM | POA: Insufficient documentation

## 2010-11-11 DIAGNOSIS — E789 Disorder of lipoprotein metabolism, unspecified: Secondary | ICD-10-CM | POA: Insufficient documentation

## 2010-11-11 DIAGNOSIS — Z951 Presence of aortocoronary bypass graft: Secondary | ICD-10-CM | POA: Insufficient documentation

## 2010-11-11 DIAGNOSIS — G8929 Other chronic pain: Secondary | ICD-10-CM | POA: Insufficient documentation

## 2010-11-11 DIAGNOSIS — W010XXA Fall on same level from slipping, tripping and stumbling without subsequent striking against object, initial encounter: Secondary | ICD-10-CM | POA: Insufficient documentation

## 2010-11-11 DIAGNOSIS — Y929 Unspecified place or not applicable: Secondary | ICD-10-CM | POA: Insufficient documentation

## 2010-11-11 DIAGNOSIS — M545 Low back pain, unspecified: Secondary | ICD-10-CM | POA: Insufficient documentation

## 2010-11-11 DIAGNOSIS — M542 Cervicalgia: Secondary | ICD-10-CM | POA: Insufficient documentation

## 2010-11-11 DIAGNOSIS — M25559 Pain in unspecified hip: Secondary | ICD-10-CM | POA: Insufficient documentation

## 2010-11-11 DIAGNOSIS — I252 Old myocardial infarction: Secondary | ICD-10-CM | POA: Insufficient documentation

## 2010-11-11 DIAGNOSIS — J4489 Other specified chronic obstructive pulmonary disease: Secondary | ICD-10-CM | POA: Insufficient documentation

## 2010-11-11 DIAGNOSIS — J449 Chronic obstructive pulmonary disease, unspecified: Secondary | ICD-10-CM | POA: Insufficient documentation

## 2010-11-11 DIAGNOSIS — IMO0001 Reserved for inherently not codable concepts without codable children: Secondary | ICD-10-CM | POA: Insufficient documentation

## 2010-11-11 DIAGNOSIS — S79919A Unspecified injury of unspecified hip, initial encounter: Secondary | ICD-10-CM | POA: Insufficient documentation

## 2010-11-11 DIAGNOSIS — M25569 Pain in unspecified knee: Secondary | ICD-10-CM | POA: Insufficient documentation

## 2010-11-12 ENCOUNTER — Ambulatory Visit: Payer: Medicaid Other

## 2010-11-12 ENCOUNTER — Ambulatory Visit: Payer: Self-pay | Admitting: Physical Medicine & Rehabilitation

## 2010-11-12 ENCOUNTER — Ambulatory Visit: Admit: 2010-11-12 | Payer: Self-pay | Admitting: Physical Medicine & Rehabilitation

## 2010-11-12 NOTE — Progress Notes (Signed)
   [facility] Cardiac faxed to [facility] attn Astrid Divine @ [phone] [facility]  October 22, 2010 12:09 PM

## 2010-11-23 ENCOUNTER — Encounter (HOSPITAL_COMMUNITY)
Admission: RE | Admit: 2010-11-23 | Discharge: 2010-11-23 | Disposition: A | Discharge status: Home / Self Care | Payer: Medicaid Other | Source: Ambulatory Visit | Attending: Orthopedic Surgery | Admitting: Orthopedic Surgery

## 2010-11-23 ENCOUNTER — Other Ambulatory Visit (HOSPITAL_COMMUNITY): Payer: Self-pay | Admitting: Orthopedic Surgery

## 2010-11-23 ENCOUNTER — Ambulatory Visit (HOSPITAL_COMMUNITY)
Admission: RE | Admit: 2010-11-23 | Discharge: 2010-11-23 | Disposition: A | Discharge status: Home / Self Care | Payer: Medicaid Other | Source: Ambulatory Visit | Attending: Orthopedic Surgery | Admitting: Orthopedic Surgery

## 2010-11-23 DIAGNOSIS — J4489 Other specified chronic obstructive pulmonary disease: Secondary | ICD-10-CM | POA: Insufficient documentation

## 2010-11-23 DIAGNOSIS — G959 Disease of spinal cord, unspecified: Secondary | ICD-10-CM

## 2010-11-23 DIAGNOSIS — I10 Essential (primary) hypertension: Secondary | ICD-10-CM | POA: Insufficient documentation

## 2010-11-23 DIAGNOSIS — J449 Chronic obstructive pulmonary disease, unspecified: Secondary | ICD-10-CM | POA: Insufficient documentation

## 2010-11-23 DIAGNOSIS — Z01818 Encounter for other preprocedural examination: Secondary | ICD-10-CM | POA: Insufficient documentation

## 2010-11-23 DIAGNOSIS — M4712 Other spondylosis with myelopathy, cervical region: Secondary | ICD-10-CM | POA: Insufficient documentation

## 2010-11-23 DIAGNOSIS — I2510 Atherosclerotic heart disease of native coronary artery without angina pectoris: Secondary | ICD-10-CM

## 2010-11-23 DIAGNOSIS — Z01810 Encounter for preprocedural cardiovascular examination: Secondary | ICD-10-CM

## 2010-11-23 LAB — TYPE AND SCREEN: ABO/RH(D): O POS

## 2010-11-23 LAB — URINALYSIS, ROUTINE W REFLEX MICROSCOPIC
Ketones, ur: NEGATIVE mg/dL
Nitrite: NEGATIVE
Specific Gravity, Urine: 1.023 (ref 1.005–1.030)
pH: 6.5 (ref 5.0–8.0)

## 2010-11-23 LAB — COMPREHENSIVE METABOLIC PANEL
Albumin: 4 g/dL (ref 3.5–5.2)
Alkaline Phosphatase: 83 U/L (ref 39–117)
BUN: 8 mg/dL (ref 6–23)
Potassium: 3.6 mEq/L (ref 3.5–5.1)
Total Protein: 6.3 g/dL (ref 6.0–8.3)

## 2010-11-23 LAB — CBC
HCT: 46.2 % (ref 39.0–52.0)
Hemoglobin: 16.3 g/dL (ref 13.0–17.0)
MCV: 89 fL (ref 78.0–100.0)
Platelets: 231 10*3/uL (ref 150–400)
RBC: 5.19 MIL/uL (ref 4.22–5.81)
WBC: 10.6 10*3/uL — ABNORMAL HIGH (ref 4.0–10.5)

## 2010-11-23 LAB — APTT: aPTT: 26 seconds (ref 24–37)

## 2010-11-23 LAB — DIFFERENTIAL
Lymphocytes Relative: 15 % (ref 12–46)
Lymphs Abs: 1.6 10*3/uL (ref 0.7–4.0)
Neutro Abs: 8.2 10*3/uL — ABNORMAL HIGH (ref 1.7–7.7)
Neutrophils Relative %: 78 % — ABNORMAL HIGH (ref 43–77)

## 2010-11-23 LAB — SURGICAL PCR SCREEN: MRSA, PCR: NEGATIVE

## 2010-11-23 LAB — ABO/RH: ABO/RH(D): O POS

## 2010-11-25 ENCOUNTER — Inpatient Hospital Stay (HOSPITAL_COMMUNITY): Payer: Medicaid Other

## 2010-11-25 ENCOUNTER — Inpatient Hospital Stay (HOSPITAL_COMMUNITY)
Admission: RE | Admit: 2010-11-25 | Discharge: 2010-11-27 | DRG: 473 | Disposition: A | Discharge status: Home / Self Care | Payer: Medicaid Other | Source: Ambulatory Visit | Attending: Orthopedic Surgery | Admitting: Orthopedic Surgery

## 2010-11-25 DIAGNOSIS — Z8673 Personal history of transient ischemic attack (TIA), and cerebral infarction without residual deficits: Secondary | ICD-10-CM

## 2010-11-25 DIAGNOSIS — J449 Chronic obstructive pulmonary disease, unspecified: Secondary | ICD-10-CM | POA: Diagnosis present

## 2010-11-25 DIAGNOSIS — I10 Essential (primary) hypertension: Secondary | ICD-10-CM | POA: Diagnosis present

## 2010-11-25 DIAGNOSIS — F17200 Nicotine dependence, unspecified, uncomplicated: Secondary | ICD-10-CM | POA: Diagnosis present

## 2010-11-25 DIAGNOSIS — G8929 Other chronic pain: Secondary | ICD-10-CM | POA: Diagnosis present

## 2010-11-25 DIAGNOSIS — Z951 Presence of aortocoronary bypass graft: Secondary | ICD-10-CM

## 2010-11-25 DIAGNOSIS — J4489 Other specified chronic obstructive pulmonary disease: Secondary | ICD-10-CM | POA: Diagnosis present

## 2010-11-25 DIAGNOSIS — I2510 Atherosclerotic heart disease of native coronary artery without angina pectoris: Secondary | ICD-10-CM | POA: Diagnosis present

## 2010-11-25 DIAGNOSIS — M4712 Other spondylosis with myelopathy, cervical region: Principal | ICD-10-CM | POA: Diagnosis present

## 2010-11-25 DIAGNOSIS — E785 Hyperlipidemia, unspecified: Secondary | ICD-10-CM | POA: Diagnosis present

## 2010-11-26 HISTORY — PX: LUMBAR LAMINECTOMY/DECOMPRESSION MICRODISCECTOMY: SHX5026

## 2010-11-26 LAB — POCT I-STAT 4, (NA,K, GLUC, HGB,HCT): Sodium: 141 mEq/L (ref 135–145)

## 2010-11-26 LAB — BASIC METABOLIC PANEL
BUN: 9 mg/dL (ref 6–23)
Creatinine, Ser: 0.94 mg/dL (ref 0.4–1.5)
GFR calc non Af Amer: >60 mL/min (ref >60)
Potassium: 3.2 mEq/L — ABNORMAL LOW (ref 3.5–5.1)

## 2010-11-30 ENCOUNTER — Telehealth: Payer: Self-pay | Admitting: Cardiology

## 2010-12-02 ENCOUNTER — Telehealth: Payer: Self-pay | Admitting: Cardiology

## 2010-12-03 NOTE — Op Note (Signed)
NAMESAVIAN, MAZON             ACCOUNT NO.:  [id number]  MEDICAL RECORD NO.:  [id number]           PATIENT TYPE:  LOCATION:                                 FACILITY:  PHYSICIAN:  Estill Bamberg, MD      DATE OF BIRTH:  [birth year]  DATE OF PROCEDURE:  11/25/2010 DATE OF DISCHARGE:                              OPERATIVE REPORT   PREOPERATIVE DIAGNOSIS:  Myelopathy.  POSTOPERATIVE DIAGNOSIS:  Myelopathy.  PROCEDURE: 1. Laminectomy with decompression of the spinal cord C3-4, C4-5, C5-6,     C6-7. 2. Posterior laminotomy with partial facetectomy and foraminotomy,     bilateral, C6-7, C7-T1. 3. Posterior cervical fusion C3-4, C4-5, C5-6, C6-7, C7-T1. 4. Insertion of posterior segmental instrumentation C3-T1. 5. Use of local autograft. 6. Intraoperative bone marrow aspiration from the C7-T1 vertebral     bodies. 7. Intraoperative use of fluoroscopy. 8. Cranial tong application and removal.  SURGEON:  Estill Bamberg, MD  ASSISTANT:  Shirl Harris, [facility]  ANESTHESIA:  General endotracheal anesthesia.  COMPLICATIONS:  None.  ESTIMATED BLOOD LOSS:  400 mL with 100 mL of Cell Saver transfused.  DISPOSITION:  Stable.  INSTRUMENTATION USED:  Mountaineer posterior instrumentation.  INDICATIONS FOR PROCEDURE:  Briefly, [name] is a 60 year old male who I saw in my office with multiple complaints.  Of particular note, the patient did complain of profound deterioration in his balance over the course of last 3 months prior to my seeing him.  He did feel that his fine motor skills were also significantly deteriorating.  I therefore did go forward with obtaining a CT myelogram of the patient's cervical spine.  The CT myelogram was consistent with spinal cord compression at multiple levels throughout his cervical spine extending across C3-4, C4-5, C5-6, and C6-7.  I therefore had discussion with the patient regarding going forward with a posterior cervical decompression and  fusion.  Also of note, the patient was noted to have a grade 1 spondylolisthesis at the C7-T1 level and therefore the recommended procedure was decompression and fusion extending from C3-T1.  The patient did fully understand the risks and benefits of the procedure. Also of particular note, the patient did understand that the goal of the procedure was to prevent ongoing deterioration in his balance and not necessarily to reverse his current state of myelopathy.  OPERATIVE DETAILS:  On November 25, 2010, the patient was brought to surgery and general endotracheal anesthesia was administered.  A Mayfield head holder was placed in the usual fashion.  Also of note, neurologic monitoring was used throughout the entire procedure.  Motor evoked potentials were obtained prior to flipping the patient prone. The patient was then flipped prone on an OR table with a Mayfield extension.  The head was then placed in a gentle degree of flexion to help optimize my exposure.  The head was secured in place with a Mayfield head holder.  The neck was then prepped and draped in the usual sterile fashion.  Ancef was given and SCDs were placed.  A time-out procedure was performed.  I then made an incision from approximately the spinous process of C2 to approximately  spinous process of T1.  I did subperiosteally expose the lamina of C3-C4, C5-C6, C7-T1.  I did get a lateral intraoperative radiograph which did confirm the appropriate levels.  After subperiosteally exposing the lamina outlined above in the transverse process of T1, I did place lateral mass screws at C3, C4, and C5 bilaterally.  To do this, I gained access to the lateral mass using 1.7-mm bur.  A drill of 14-mm in length was then used.  I did aim the drill at approximately 30 degrees cephalad and 30 degrees laterally.  I was able to identify that no cortical violation had occurred by using a ball-tip probe.  The holes were sealed with bone wax.  I  then performed bilateral laminotomies, partial facetectomy, and foraminotomies at C6-7 and C7-T1.  This was done to decompress the nerve roots and to help locate the C7 and T1 pedicles.  I then gained access to the C7-T1 pedicles using a 1.7-mm bur followed by gearshift probe.  I then tapped the C7 and T1 screws using a 3.5-mm tap.  Of note, the lateral mass screws were also tapped using a 3.5-mm tap.  Of note, on the right side when I did gain access to the vertebral body through the pedicles at C7 and T1, I did obtain bone marrow aspirate from those vertebral bodies. This was mixed with a 5 mL aliquot of Vitoss BA and placed onto the back table.  At this point, I did use a 1.7-mm bur in order to decorticate the facet joints at the C3-4, C4-5, C5-6, C6-7, and C7-T1 levels.  I did this in order to help optimize the fusion success.  At this point, I copiously irrigated the wound.  Of note, I did copiously irrigate the wound approximately every 45 minutes.  Retractors were relaxed at about every 45 minutes as well.  At this point, prior to instrumenting the spine, I went forward with the central decompression aspect of the procedure.  I did use a 3-mm bur and I did perform a laminectomy with a trough that measured approximately 15 mm.  This was done across the C4, C5, and C6-7 levels.  Once troughs were created bilaterally, I did have an assistant holding upward retraction of the lamina using towel clips. I then delicately used a 2-mm Kerrison in addition to curved curettes in order to safely remove the lamina.  I then performed motor evoked potentials and the motor evoked potentials were consistent with baseline.  At this point, I did decorticate the posterior elements of C3, C4, C5, C6, and C7 including the transverse process of C7.  The autograft obtained from the decompression was mixed with the Vitoss BA and bone marrow aspirate combination.  This was then again placed onto the back  table.  I then placed 3.5 x 14 mm screws into the lateral masses of C3, C4, and C5.  I then placed a 4 x 22 mm screw at C7 and a 4 x 26 mm screw at T1.  I then packed the bone marrow aspirate, Vitoss, autograft combination across the posterior elements.  They were previously decorticated.  I then bent a 3.5-mm rod into the appropriate degree of lordosis.  These were placed into the screws and caps were placed.  A final tightening procedure was performed.  I made sure to extend the neck prior to placing the rods in order to ensure that the patient was in the optimal degree of lordosis.  After a final tightening was performed,  I obtained a lateral intraoperative radiograph which did confirm that the appropriate levels were instrumented.  The hardware did not appear to be adequate.  Again, motor evoked potentials were performed and they were consistent with baseline after every testing. Of note, prior to placing the autograft Vitoss mixture, I did copiously irrigate the wound again with 1 liter of normal saline.  I then closed the fascia using #1 Vicryl.  The subcutaneous layer was closed using 2-0 Vicryl and the skin was closed using 3-0 Monocryl.  The patient was then rolled supine and the Mayfield head holder was removed.  A hard collar was then placed.  The patient was awoken from general endotracheal anesthesia and transferred to recovery in stable condition.  Of note, all instrument counts were correct at the termination of the procedure.  Of note, Shirl Harris was my assistant throughout the procedure and aided in essential suctioning and retraction that was needed throughout the procedure.     Estill Bamberg, MD     MD/MEDQ  D:  11/25/2010  T:  11/26/2010  Job:  [id number]  Electronically Signed by Estill Bamberg  on 12/03/2010 02:35:08 PM

## 2010-12-08 NOTE — Progress Notes (Addendum)
Summary: question about plavix   Phone Note Call from Patient Call back at [phone]   Caller: Daughter/Brandy Summary of Call: has a questions regarding plavix Initial call taken by: Omer Jack,  December 02, 2010 1:09 PM  Follow-up for Phone Call        I called the number provided and it says to enter 10 digit number to leave a message. Cannot leave message at this number.  This pt also needs to be seen by Dr Riley Kill for CP per previous phone note.  Left message at home number for pt and Brandy to call back. Julieta Gutting, RN, BSN  December 02, 2010 1:25 PM  Pt daughter Gearldine Bienenstock returninmg call Judie Grieve  December 02, 2010 3:33 PM  I spoke with Gearldine Bienenstock and she was very rude.  I made her aware that I had left a message about having the pt come into the office today as an add-on for Dr Riley Kill to be evaluated for CP.  She said the pt was not having CP anymore and that the pt's girlfriend had not been giving the pt his medication. Brandy then said she called today to ask if her dad could restart his Plavix after having neck surgery last week. I told her that the surgeon should have given the pt instructions on when it would be safe to restart this medication.  She said that they called the surgeon and they told her to call the Cardiologist.  I told her that Dr Riley Kill would not give instructions about the safety of resuming Plavix after neck surgery and that the surgeon should give this instruction when it is felt safe to resume plavix.  Brandy then said that our office took to long to call her back so she gave the pt his plavix already and then hung up on me.    Follow-up by: Julieta Gutting, RN, BSN,  December 02, 2010 4:28 PM     Appended Document: question about plavix We discussed at that time.  See prior office visits, and no show visits.  TS

## 2010-12-08 NOTE — Progress Notes (Signed)
Summary: chest pain sob   Phone Note Call from Patient   Caller: Daughter/brandy Reason for Call: Talk to Nurse Summary of Call: pt is having chest pain and sob for three days.  Initial call taken by: Roe Coombs,  November 30, 2010 11:53 AM  Follow-up for Phone Call        pt had recent surgery on his neck (Wed).  Daughter states he has been having chest pressure with SOB for 3 days.  chest pain in center of chest with  radiation down left arm, worse with activity, no n/v.  Also complaints that his defibrillator is hurting and there is a burning feeling there.  Pt has not used any SL Ntg.  Daughter also states that pt is having pain at surgical site in his neck.  Instructed daughter to have pt use his SL Ntg and if pain is not better he will need to report to the ED.  Daughter requested an appt with Dr Riley Kill for the pt and the 1st available is 01/13/2011 at 3:30pm  appt scheduled.  Will forward to Lauren, RN and Dr Riley Kill for their review and knowledge.  Daughter would like a sooner appt if possible. Follow-up by: Charolotte Capuchin, RN,  November 30, 2010 12:20 PM     Appended Document: chest pain sob Reviewed with Dr Riley Kill on 12/01/10 and left a message for the pt to call back to arrange appt on 12/02/10 with Dr Riley Kill.

## 2010-12-18 NOTE — Consult Note (Signed)
[name], [name]             ACCOUNT NO.:  [id number]  MEDICAL RECORD NO.:  [id number]           PATIENT TYPE:  O  LOCATION:  XRAY                         FACILITY:  [facility]  PHYSICIAN:  Pricilla Riffle, MD, FACCDATE OF BIRTH:  [birth year]  DATE OF CONSULTATION:  11/23/2010 DATE OF DISCHARGE:  11/23/2010                                CONSULTATION   PRIMARY CARDIOLOGIST:  Maisie Fus D. Riley Kill, MD, [facility].  PRIMARY CARE PHYSICIAN:  Dr. Wyline Mood, [facility].  REASON FOR CONSULTATION:  Preoperative clearance for posterior cervical decompression and fusion of C3 through T1.  HISTORY OF PRESENT ILLNESS:  This is a 60 year old gentleman with history of coronary artery disease status post coronary artery bypass grafting, hypertension, hyperlipidemia, and chronic pain, who was seen at [facility] for his preoperative labs and EKG.  Upon evaluation, the patient stated that he had an episode of chest pain approximately 10 days ago.  This was retrosternal.  Pain was nonradiating and no associated symptoms were noticed.  The patient states he is not really ambulatory at this time and can stumble to the bathroom around his home without evidence of chest pain.  The most recent episode lasted several minutes.  He does have nitroglycerin without resolution.  Pain did resolve on its own after several minutes.  The patient states his prior episodes of chest pain has been approximately 4-5 months ago.  He has been in his usual state of health in between.  The patient is very anxious to complete the surgery as he is in much pain.  EKG was obtained that appears unchanged from prior tracings.  The patient's chest pain is intermittent and has not changed in frequency or severity over the past 6 months.  There is a questionable cath from [facility].  We are currently awaiting records as the patient's last cath at [facility] was in 2009.  At this time, the patient had 3/4 patent graft.  The  patient is currently hemodynamically stable.  Cardiology was asked to see the patient for preoperative clearance.  The patient currently denies any chest pain, shortness of breath, nausea, or vomiting.  He does have continued back pain.  No nausea, vomiting or diarrhea.  PAST MEDICAL HISTORY: 1. Coronary artery disease status post coronary artery bypass grafting     x4 in 2006.     a.     Internal mammary to LAD, widely patent, SVG to diagonal with      slightly pain, circumflex demonstrated a 40% lesion on the      proximal midportion.  The SVG to the OM is occluded, this is      known.  SVG to PDA is intact with mild tapered narrowing at its      insertion, functionally normal.  RCA is totally occluded.  These      results are as per cardiac catheterization in 2009. 2. Hypertension. 3. Hyperlipidemia. 4. COPD. 5. History of ventricular tachycardia, status post Medtronic ICD.     a.     Interrogated, October 2011, showing normal device function.      No arrhythmias noted. 6. Chronic pain.  7. Degenerative disk disease. 8. Ongoing tobacco abuse. 9. Bipolar disorder. 10.History of polysubstance abuse, tobacco, cocaine, alcohol,     marijuana. 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 12.Seizure disorder. 13.Status post echocardiogram in 2009 showing an ejection fraction of     55% with hypokinesis of the basal inferior-posterior wall.  SOCIAL HISTORY:  The patient lives in [place] with his daughter.  He is currently unemployed.  The patient was incarcerated in the last year and is now on house arrest.  He has a 45-pack-year smoking history, continues both between 2 cigarettes and 1 pack a day.  History of alcohol abuse.  History of cocaine and marijuana abuse.  FAMILY HISTORY:  His mother passed away at the age of 32 from myocardial infarction.  His father passed away at the age of 94 from  myocardial infarction.  ALLERGIES: 1. CODEINE. 2. PROCARDIA.  OUTPATIENT MEDICATIONS: 1. Cetirizine 10 mg daily. 2. Gabapentin 100 mg daily. 3. Diovan 320 mg daily. 4. Simvastatin 20 mg daily. 5. Lisinopril 20 mg daily. 6. Ventolin HFA as needed. 7. Spiriva 18 mcg inhaled daily. 8. QVAR 2 puffs daily. 9. Plavix 75 mg daily. 10.Phenytoin 100 mg daily. 11.Oxycodone and acetaminophen 7.5/325 mg as needed. 12.Omeprazole 20 mg daily. 13.Nitroglycerin sublingual 1 tablet under the tongue at the onset of     chest pain as needed. 14.Fluticasone nightly. 15.Diazepam 10 mg twice daily. 16.Aspirin 325 mg daily.  REVIEW OF SYSTEMS:  All pertinent positives and negatives as stated in HPI.  All other systems have been reviewed and are negative.  PHYSICAL EXAM:  VITAL SIGNS:  Temperature 98.6, pulse 69, respirations 18, blood pressure 166/91. GENERAL:  This is a well-developed, well-nourished middle-aged gentleman.  He is in no acute distress. HEENT:  Normal. NECK:  Supple without bruit or JVD. HEART:  Regular rate and rhythm without murmur, rub, or gallop noted. PMI is normal.  Pulses 2+ equal bilaterally. LUNGS:  Decreased air flow throughout.  No wheezes, rales, or rhonchi noted. ABDOMEN:  Soft, nontender, positive bowel sounds x4.  Negative hepatosplenomegaly. EXTREMITIES:  No clubbing, cyanosis, or edema.  There is an ankle band noted on the left side. MUSCULOSKELETAL:  No joint deformities or effusions. NEURO:  Alert and oriented x3.  Cranial nerves II through XII grossly intact.  Chest x-ray showing hyperexpansion without acute changes.  EKG showing normal sinus rhythm at a rate of 64 beats per minute.  There is a wide bundle-branch block.  There are slight ST elevations in lead III, and aVF that appear older and unchanged from prior tracing in 2011.  There is ST depressions noted in V3 through V6 as well as lead I and aVL, these are also unchanged from prior  tracings.  Labs:  WBC is 10.6, hemoglobin is 16.3, hematocrit 46.2, platelets 231. Sodium 142, potassium 3.6, chloride 104, bicarb 29, BUN 8, creatinine 0.95, INR 0.94.  ASSESSMENT/PLAN:  This is a 60 year old gentleman with history of coronary artery disease, status post coronary artery bypass grafting, hypertension, hyperlipidemia, and continued tobacco abuse, who is being evaluated for preoperative clearance.  With the patient's history, we are not currently convinced that this is unstable symptoms.  He appears stable.  The patient does state that he had a catheterization at [facility] in the fall of 2010, therefore, we will review this catheter report.  If no significant changes from previous catheterization completed at [facility] in 2009 with known one graft occlusion, and  LVEF normal and unchanged.  We feel he would be at low risk for major cardiac events. No okay to proceed without further testing.  We will continue to follow up with the patient.     Leonette Monarch, PA-C   ______________________________ Pricilla Riffle, MD, [facility]    NB/MEDQ  D:  11/23/2010  T:  11/24/2010  Job:  [id number]  Electronically Signed by Alen Blew P.A. on 11/27/2010 01:33:35 PM Electronically Signed by Dietrich Pates MD FACC on 12/17/2010 02:14:12 PM

## 2010-12-23 LAB — CARDIAC PANEL(CRET KIN+CKTOT+MB+TROPI)
CK, MB: 2.4 ng/mL (ref 0.3–4.0)
Relative Index: INVALID (ref 0.0–2.5)
Total CK: 88 U/L (ref 7–232)
Troponin I: 0.01 ng/mL (ref 0.00–0.06)

## 2010-12-23 LAB — URINALYSIS, ROUTINE W REFLEX MICROSCOPIC
Bilirubin Urine: NEGATIVE
Ketones, ur: NEGATIVE mg/dL
Nitrite: NEGATIVE
Protein, ur: NEGATIVE mg/dL

## 2010-12-23 LAB — DIFFERENTIAL
Basophils Absolute: 0 10*3/uL (ref 0.0–0.1)
Basophils Absolute: 0.1 10*3/uL (ref 0.0–0.1)
Basophils Relative: 0 % (ref 0–1)
Basophils Relative: 0 % (ref 0–1)
Basophils Relative: 0 % (ref 0–1)
Eosinophils Absolute: 0.1 10*3/uL (ref 0.0–0.7)
Eosinophils Absolute: 0.3 10*3/uL (ref 0.0–0.7)
Eosinophils Relative: 1 % (ref 0–5)
Lymphocytes Relative: 14 % (ref 12–46)
Lymphs Abs: 1.5 10*3/uL (ref 0.7–4.0)
Monocytes Absolute: 0.6 10*3/uL (ref 0.1–1.0)
Monocytes Absolute: 0.8 10*3/uL (ref 0.1–1.0)
Monocytes Absolute: 0.6 10*3/uL (ref 0.1–1.0)
Monocytes Relative: 6 % (ref 3–12)
Monocytes Relative: 5 % (ref 3–12)
Neutro Abs: 6.3 10*3/uL (ref 1.7–7.7)
Neutro Abs: 11.3 10*3/uL — ABNORMAL HIGH (ref 1.7–7.7)
Neutrophils Relative %: 63 % (ref 43–77)
Neutrophils Relative %: 79 % — ABNORMAL HIGH (ref 43–77)
Neutrophils Relative %: 80 % — ABNORMAL HIGH (ref 43–77)

## 2010-12-23 LAB — CBC
HCT: 40.1 % (ref 39.0–52.0)
HCT: 41.8 % (ref 39.0–52.0)
HCT: 43.3 % (ref 39.0–52.0)
Hemoglobin: 13.1 g/dL (ref 13.0–17.0)
MCH: 30.9 pg (ref 26.0–34.0)
MCHC: 32.2 g/dL (ref 30.0–36.0)
MCHC: 32.5 g/dL (ref 30.0–36.0)
MCHC: 33.7 g/dL (ref 30.0–36.0)
MCV: 91.5 fL (ref 78.0–100.0)
Platelets: 240 10*3/uL (ref 150–400)
RDW: 12.7 % (ref 11.5–15.5)
RDW: 12.9 % (ref 11.5–15.5)
RDW: 13.2 % (ref 11.5–15.5)
RDW: 12.6 % (ref 11.5–15.5)
WBC: 10.4 10*3/uL (ref 4.0–10.5)

## 2010-12-23 LAB — COMPREHENSIVE METABOLIC PANEL
ALT: 23 U/L (ref 0–53)
ALT: 32 U/L (ref 0–53)
AST: 23 U/L (ref 0–37)
Albumin: 3.7 g/dL (ref 3.5–5.2)
Albumin: 3.7 g/dL (ref 3.5–5.2)
Alkaline Phosphatase: 93 U/L (ref 39–117)
Alkaline Phosphatase: 95 U/L (ref 39–117)
BUN: 6 mg/dL (ref 6–23)
Calcium: 9.1 mg/dL (ref 8.4–10.5)
Calcium: 9.4 mg/dL (ref 8.4–10.5)
Creatinine, Ser: 1.15 mg/dL (ref 0.4–1.5)
Creatinine, Ser: 1.06 mg/dL (ref 0.4–1.5)
GFR calc Af Amer: >60 mL/min (ref >60)
Glucose, Bld: 112 mg/dL — ABNORMAL HIGH (ref 70–99)
Glucose, Bld: 94 mg/dL (ref 70–99)
Glucose, Bld: 91 mg/dL (ref 70–99)
Potassium: 3.6 mEq/L (ref 3.5–5.1)
Potassium: 4.1 mEq/L (ref 3.5–5.1)
Sodium: 137 mEq/L (ref 135–145)
Sodium: 140 mEq/L (ref 135–145)
Total Protein: 6.5 g/dL (ref 6.0–8.3)
Total Protein: 6.5 g/dL (ref 6.0–8.3)
Total Protein: 7 g/dL (ref 6.0–8.3)

## 2010-12-23 LAB — TSH: TSH: 0.734 u[IU]/mL (ref 0.350–4.500)

## 2010-12-23 LAB — HEPARIN LEVEL (UNFRACTIONATED): Heparin Unfractionated: 0.19 IU/mL — ABNORMAL LOW (ref 0.30–0.70)

## 2010-12-23 LAB — LIPID PANEL
LDL Cholesterol: 71 mg/dL (ref 0–99)
Total CHOL/HDL Ratio: 4.3 RATIO
Triglycerides: 81 mg/dL (ref <150)
VLDL: 16 mg/dL (ref 0–40)

## 2010-12-23 LAB — RAPID URINE DRUG SCREEN, HOSP PERFORMED
Amphetamines: NOT DETECTED
Benzodiazepines: POSITIVE — AB
Cocaine: NOT DETECTED
Opiates: NOT DETECTED
Tetrahydrocannabinol: NOT DETECTED
Tetrahydrocannabinol: NOT DETECTED

## 2010-12-23 LAB — CK TOTAL AND CKMB (NOT AT ARMC): CK, MB: 2.6 ng/mL (ref 0.3–4.0)

## 2010-12-23 LAB — ETHANOL: Alcohol, Ethyl (B): <5 mg/dL (ref 0–10)

## 2010-12-23 LAB — POCT CARDIAC MARKERS
CKMB, poc: 1.3 ng/mL (ref 1.0–8.0)
Myoglobin, poc: 111 ng/mL (ref 12–200)
Troponin i, poc: <0.05 ng/mL (ref 0.00–0.09)

## 2011-01-02 ENCOUNTER — Encounter: Payer: Self-pay | Admitting: Cardiology

## 2011-01-13 ENCOUNTER — Ambulatory Visit: Payer: Medicaid Other | Admitting: Cardiology

## 2011-01-16 LAB — CK TOTAL AND CKMB (NOT AT ARMC)
CK, MB: 2.9 ng/mL (ref 0.3–4.0)
Relative Index: 1.6 (ref 0.0–2.5)
Relative Index: 1.7 (ref 0.0–2.5)
Total CK: 157 U/L (ref 7–232)
Total CK: 173 U/L (ref 7–232)

## 2011-01-16 LAB — TROPONIN I
Troponin I: 0.03 ng/mL (ref 0.00–0.06)
Troponin I: 0.03 ng/mL (ref 0.00–0.06)

## 2011-01-16 LAB — URINALYSIS, ROUTINE W REFLEX MICROSCOPIC
Bilirubin Urine: NEGATIVE
Glucose, UA: NEGATIVE mg/dL
Hgb urine dipstick: NEGATIVE
Specific Gravity, Urine: 1.008 (ref 1.005–1.030)

## 2011-01-16 LAB — POCT I-STAT, CHEM 8
Calcium, Ion: 1.16 mmol/L (ref 1.12–1.32)
HCT: 44 % (ref 39.0–52.0)
TCO2: 22 mmol/L (ref 0–100)

## 2011-01-16 LAB — POCT CARDIAC MARKERS

## 2011-01-16 LAB — CARDIAC PANEL(CRET KIN+CKTOT+MB+TROPI)
CK, MB: 2.8 ng/mL (ref 0.3–4.0)
Relative Index: 2.2 (ref 0.0–2.5)
Troponin I: 0.03 ng/mL (ref 0.00–0.06)

## 2011-01-16 LAB — DIFFERENTIAL
Basophils Absolute: 0 10*3/uL (ref 0.0–0.1)
Eosinophils Absolute: 0 10*3/uL (ref 0.0–0.7)
Eosinophils Relative: 0 % (ref 0–5)

## 2011-01-16 LAB — CBC
Platelets: 243 10*3/uL (ref 150–400)
RDW: 13.7 % (ref 11.5–15.5)

## 2011-01-18 LAB — POCT I-STAT, CHEM 8
BUN: 11 mg/dL (ref 6–23)
Calcium, Ion: 1.23 mmol/L (ref 1.12–1.32)
Chloride: 107 mEq/L (ref 96–112)
Creatinine, Ser: 1.1 mg/dL (ref 0.4–1.5)
Glucose, Bld: 93 mg/dL (ref 70–99)
TCO2: 26 mmol/L (ref 0–100)

## 2011-01-18 LAB — CBC
Hemoglobin: 14.4 g/dL (ref 13.0–17.0)
MCHC: 34.1 g/dL (ref 30.0–36.0)
MCV: 91.8 fL (ref 78.0–100.0)
RDW: 13.8 % (ref 11.5–15.5)

## 2011-01-18 LAB — DIFFERENTIAL
Basophils Absolute: 0 10*3/uL (ref 0.0–0.1)
Basophils Relative: 1 % (ref 0–1)
Eosinophils Absolute: 0.2 10*3/uL (ref 0.0–0.7)
Eosinophils Relative: 2 % (ref 0–5)
Monocytes Absolute: 0.4 10*3/uL (ref 0.1–1.0)
Neutro Abs: 4.2 10*3/uL (ref 1.7–7.7)

## 2011-01-18 LAB — POCT CARDIAC MARKERS: Troponin i, poc: <0.05 ng/mL (ref 0.00–0.09)

## 2011-01-18 LAB — APTT: aPTT: 32 seconds (ref 24–37)

## 2011-01-20 LAB — DIFFERENTIAL
Basophils Relative: 1 % (ref 0–1)
Basophils Relative: 0 % (ref 0–1)
Eosinophils Absolute: 0.3 10*3/uL (ref 0.0–0.7)
Lymphs Abs: 2.8 10*3/uL (ref 0.7–4.0)
Monocytes Absolute: 0.5 10*3/uL (ref 0.1–1.0)
Monocytes Absolute: 0.5 10*3/uL (ref 0.1–1.0)
Monocytes Relative: 5 % (ref 3–12)
Monocytes Relative: 5 % (ref 3–12)
Neutro Abs: 7.5 10*3/uL (ref 1.7–7.7)
Neutrophils Relative %: 61 % (ref 43–77)

## 2011-01-20 LAB — CBC
Hemoglobin: 15.3 g/dL (ref 13.0–17.0)
Hemoglobin: 15.3 g/dL (ref 13.0–17.0)
MCHC: 34.3 g/dL (ref 30.0–36.0)
MCHC: 35 g/dL (ref 30.0–36.0)
MCV: 91.1 fL (ref 78.0–100.0)
RBC: 4.91 MIL/uL (ref 4.22–5.81)
RBC: 4.81 MIL/uL (ref 4.22–5.81)
WBC: 9.6 10*3/uL (ref 4.0–10.5)

## 2011-01-20 LAB — BASIC METABOLIC PANEL
CO2: 29 mEq/L (ref 19–32)
CO2: 28 mEq/L (ref 19–32)
Calcium: 9.3 mg/dL (ref 8.4–10.5)
Chloride: 103 mEq/L (ref 96–112)
Chloride: 105 mEq/L (ref 96–112)
GFR calc Af Amer: >60 mL/min (ref >60)
GFR calc Af Amer: 58 mL/min — ABNORMAL LOW (ref >60)
Potassium: 3.9 mEq/L (ref 3.5–5.1)
Potassium: 3.8 mEq/L (ref 3.5–5.1)
Sodium: 138 mEq/L (ref 135–145)
Sodium: 140 mEq/L (ref 135–145)

## 2011-01-20 LAB — POCT CARDIAC MARKERS
CKMB, poc: 2 ng/mL (ref 1.0–8.0)
CKMB, poc: <1 ng/mL — ABNORMAL LOW (ref 1.0–8.0)
Myoglobin, poc: 54.8 ng/mL (ref 12–200)
Troponin i, poc: <0.05 ng/mL (ref 0.00–0.09)
Troponin i, poc: <0.05 ng/mL (ref 0.00–0.09)

## 2011-01-20 LAB — CARDIAC PANEL(CRET KIN+CKTOT+MB+TROPI)
CK, MB: 2.4 ng/mL (ref 0.3–4.0)
Relative Index: 1.9 (ref 0.0–2.5)
Total CK: 125 U/L (ref 7–232)
Troponin I: 0.01 ng/mL (ref 0.00–0.06)

## 2011-01-20 LAB — RAPID URINE DRUG SCREEN, HOSP PERFORMED
Amphetamines: NOT DETECTED
Barbiturates: NOT DETECTED
Benzodiazepines: POSITIVE — AB
Cocaine: NOT DETECTED
Cocaine: NOT DETECTED
Opiates: POSITIVE — AB
Tetrahydrocannabinol: NOT DETECTED

## 2011-01-20 LAB — PROTIME-INR: INR: 1 (ref 0.00–1.49)

## 2011-01-20 LAB — D-DIMER, QUANTITATIVE: D-Dimer, Quant: 0.35 ug/mL-FEU (ref 0.00–0.48)

## 2011-01-20 LAB — HEMOGLOBIN A1C: Hgb A1c MFr Bld: 5.6 % (ref 4.6–6.1)

## 2011-01-20 LAB — CK TOTAL AND CKMB (NOT AT ARMC): Total CK: 67 U/L (ref 7–232)

## 2011-01-25 LAB — POCT I-STAT, CHEM 8
BUN: 7 mg/dL (ref 6–23)
Calcium, Ion: 1.28 mmol/L (ref 1.12–1.32)
Chloride: 100 mEq/L (ref 96–112)
Creatinine, Ser: 1 mg/dL (ref 0.4–1.5)
Glucose, Bld: 88 mg/dL (ref 70–99)
HCT: 48 % (ref 39.0–52.0)
Hemoglobin: 16.3 g/dL (ref 13.0–17.0)
Potassium: 3.1 meq/L — ABNORMAL LOW (ref 3.5–5.1)
Sodium: 142 mEq/L (ref 135–145)
TCO2: 30 mmol/L (ref 0–100)

## 2011-01-25 LAB — DIFFERENTIAL
Lymphocytes Relative: 28 % (ref 12–46)
Lymphs Abs: 3.3 10*3/uL (ref 0.7–4.0)
Monocytes Absolute: 0.6 10*3/uL (ref 0.1–1.0)
Monocytes Relative: 5 % (ref 3–12)
Neutro Abs: 7.8 10*3/uL — ABNORMAL HIGH (ref 1.7–7.7)

## 2011-01-25 LAB — RAPID URINE DRUG SCREEN, HOSP PERFORMED
Amphetamines: NOT DETECTED
Barbiturates: NOT DETECTED
Benzodiazepines: POSITIVE — AB
Cocaine: NOT DETECTED
Opiates: NOT DETECTED
Tetrahydrocannabinol: NOT DETECTED

## 2011-01-25 LAB — CARDIAC PANEL(CRET KIN+CKTOT+MB+TROPI)
CK, MB: 1.6 ng/mL (ref 0.3–4.0)
Relative Index: INVALID (ref 0.0–2.5)
Relative Index: INVALID (ref 0.0–2.5)
Troponin I: 0.02 ng/mL (ref 0.00–0.06)

## 2011-01-25 LAB — COMPREHENSIVE METABOLIC PANEL
ALT: 15 U/L (ref 0–53)
AST: 14 U/L (ref 0–37)
Albumin: 3.4 g/dL — ABNORMAL LOW (ref 3.5–5.2)
CO2: 27 mEq/L (ref 19–32)
Calcium: 9.5 mg/dL (ref 8.4–10.5)
GFR calc Af Amer: >60 mL/min (ref >60)
GFR calc non Af Amer: >60 mL/min (ref >60)
Sodium: 137 mEq/L (ref 135–145)

## 2011-01-25 LAB — CK TOTAL AND CKMB (NOT AT ARMC)
CK, MB: 1.9 ng/mL (ref 0.3–4.0)
Relative Index: INVALID (ref 0.0–2.5)
Total CK: 54 U/L (ref 7–232)

## 2011-01-25 LAB — CBC
Hemoglobin: 14.9 g/dL (ref 13.0–17.0)
MCHC: 33.1 g/dL (ref 30.0–36.0)
RBC: 4.86 MIL/uL (ref 4.22–5.81)

## 2011-01-25 LAB — APTT: aPTT: 29 seconds (ref 24–37)

## 2011-01-25 LAB — POCT CARDIAC MARKERS
CKMB, poc: <1 ng/mL — ABNORMAL LOW (ref 1.0–8.0)
Myoglobin, poc: 38.2 ng/mL (ref 12–200)
Troponin i, poc: <0.05 ng/mL (ref 0.00–0.09)

## 2011-01-25 LAB — TROPONIN I: Troponin I: 0.02 ng/mL (ref 0.00–0.06)

## 2011-02-23 NOTE — Letter (Signed)
December 13, 2007    Geroge Baseman. Vicenta Dunning, [facility]  [address]  [place], [place] [address]   RE:  KHAMARI, SHEEHAN  MRN:  [id number]  /  DOB:  04-06-1951   Dear Ms. Rogers:   [name] is a patient that I have seen in the past.  He  underwent coronary artery bypass graft surgery previously.  When he was  recently hospitalized, I was out of town, but he was basically admitted  to our group, underwent a cardiac catheterization and electrophysiologic  study, and subsequently had implantation of an implantable  defibrillator.  Mr. Doubek has subsequently been readmitted for need  for repositioning of the defibrillator lead by Dr. Ladona Ridgel after  inappropriate firing of the device.  He subsequently has stabilized.   Mr. Stockham does have underlying heart disease.  He had a cardiac  catheterization study done by my colleague, Dr. Juanda Chance.  The left main  was free of significant disease.  There was competing flow in the distal  LAD and diagonal branch.  There was a long area of segmental narrowing  of about 80% and 90% narrowing at the ostium of the large diagonal  branch.  The internal mammary to the LAD was patent and functioned well.  The saphenous vein graft to the diagonal was patent and functioned well.  The circumflex vessel demonstrated 30% narrowing of the mid circumflex  and 30% narrowing in the distal circumflex, and the graft to this vessel  was occluded probably due to competitive flow.  The right coronary  artery was completely occluded at its midportion and the free radial  graft to the posterior descending was patent and functioned well.  The  ventriculogram demonstrated akinesis of the inferobasal and inferior  wall with an estimated ejection fraction in the range of 50%.  The  distal aortogram showed patent renal arteries and no significant  aortoiliac obstruction.   The patient is currently being followed by neurology.  We are likely  to  recommend a sleep study in his case.  He also has issues well documented  in his chart which lead me to believe that he would have difficulty  working in the future.   I hope this information is helpful in this case.    Sincerely,      Arturo Morton. Riley Kill, MD, [facility]  Electronically Signed    TDS/MedQ  DD: 12/14/2007  DT: 12/14/2007  Job #: [id number]

## 2011-02-23 NOTE — Discharge Summary (Signed)
[name], [name]             ACCOUNT NO.:  [id number]   MEDICAL RECORD NO.:  [id number]          PATIENT TYPE:  INP   LOCATION:  4702                         FACILITY:  [facility]   PHYSICIAN:  Madolyn Frieze. Jens Som, MD, FACCDATE OF BIRTH:  1951-09-16   DATE OF ADMISSION:  02/04/2009  DATE OF DISCHARGE:  02/06/2009                               DISCHARGE SUMMARY   PRIMARY CARDIOLOGIST:  Maisie Fus D. Riley Kill, MD, [facility]   PRIMARY CARE PHYSICIAN:  Maurice March, MD, at [facility]   PROCEDURES PERFORMED DURING HOSPITALIZATION:  Cardiac catheterization,  completed on February 04, 2009.  A:  Patent left internal mammary artery to left anterior descending,  patent saphenous vein graft to diagonal, patent saphenous vein graft to  posterior descending artery, occluded saphenous vein graft to obtuse  marginal with patent native circumflex.  Continue to observe medical  management.   FINAL DISCHARGE DIAGNOSES:  1. Hypertensive urgency.  2. Chest pain.  3. Coronary artery disease.  A:  Myocardial infarction in 1986 and 2006.  B.  Coronary artery bypass grafting.  Left internal mammary artery to  left anterior descending, saphenous vein graft to diagonal, vein graft  to obtuse marginal and radial artery to posterior descending artery.  D.  Catheterization in 2009, revealing patent grafts with ejection  fraction of 60%.  E.  Catheterization this admission revealing patent grafts.  1. Status post Medtronic implantable cardioverter-defibrillator,      secondary ventricular tachycardia.  2. Severe degenerative disk disease with chronic pain.  3. Ongoing tobacco abuse.  4. Bipolar disorder.   HOSPITAL COURSE:  This is a 60 year old Caucasian male with known  history as stated above who was sitting on his couch at 3:00 a.m.,  talking to his daughter in the phone and he had severe episode of chest  pain.  He came to the emergency room, where he was found to have a blood  pressure of 240/140.   The patient admits to the medical noncompliance at  times, states that he does not always remember things.  The patient was  given fentanyl, chewable aspirin, and labetalol 20 mg IV.  The patient  continued to have chest discomfort and was admitted to ICU.  The patient  did have a prior history of drug abuse using cocaine and marijuana,  urine drug screen was negative.  The patient was monitored in the ICU  and had follow up cardiac catheterization the following day per Dr.  Shawnie Pons.  The patient in the interim did have a CT abdomen,  pelvis, and chest to rule out aortic dissection.  This was negative.  The patient had better blood pressure control on nitroglycerin drip and  reinstitution of p.o. medications as he was taking at home.   The patient improved much better after taking p.o. medicines during  hospitalization.  His blood pressure was running 149/90 and heart rate  58.  He was followed again overnight, to monitor blood pressure after  catheterization.  The patient did not have any complications post  catheterization and had no bleed, bruit, or hematoma at the groin site  of the  catheterization.   On day of discharge, the patient was seen and examined by Dr. Olga Millers.  He was very anxious to go home, but he wanted an antianxiety  medications.  These were not provided for the patient as he will need to  see his primary care physician for that.  He is currently on Valium 10  mg b.i.d., and he states that it only makes him sleepy.  He does have a  follow up appointment with Behavior Health, secondary to stresses at  home and we will defer to them concerning any additional medications.  The patient was seen and examined by Dr. Olga Millers and found to be  stable.  Lisinopril was added to his medication regimen 10 mg daily, and  he has been advised on continued to have better compliance with  medications.  He will follow up with a BMET in 1 week and follow with  Dr.  Riley Kill on a previously scheduled appointment.   DISCHARGE LABORATORIES:  Sodium 138, potassium 3.9, chloride 103, CO2 of  29, BUN 7, creatinine 1.0, and glucose 112.  Hemoglobin 15.3, hematocrit  44.8, white blood cells 9.6, and platelets 242.  D-dimer 0.35 and  troponin negative x3.  CT of the abdomen, pelvis, and chest was negative  for aortic dissection.  Short segment occlusion to the left subclavian  probably related to pacemaker.  Eccentric partially calcified plaque.  Bilateral common iliac arteries without stenosis.   DISCHARGE VITAL SIGNS:  Blood pressure 145/95, heart rate 56,  respirations 18, temperature 97.4, and O2 sat 98% on room air.   DISCHARGE MEDICATIONS:  1. Aspirin 325 daily.  2. Lipitor 80 mg daily.  3. Omeprazole 20 mg daily.  4. Lopressor 50 mg twice a day.  5. Zetia 10 mg daily.  6. Klor-Con 20 mEq daily.  7. Hydrochlorothiazide 25 mg daily.  8. Percocet 7.5 mg q.4-6 h. p.r.n. pain.  9. Valium 10 mg twice a day.  10.Lisinopril 10 mg daily (new prescription provided as this is a new      medication).   ALLERGIES:  1. CONTRAST MEDIA.  2. CODEINE.  3. PROCARDIA.   FOLLOWUP PLANS AND APPOINTMENT:  1. The patient will follow up in [facility] for a BMET to be      drawn on Feb 13, 2009.  2. The patient is scheduled to see Dr. Riley Kill on a previously      scheduled appointment, March 11, 2009, at 1:45.  3. The patient is to follow up with Dr. Audria Nine, primary care      physician for continued medical management.  4. The patient has a follow up appointment next week with Behavior      Health for assistance with anxiety and      stress.  5. The patient has been advised on medical compliance.   Time spent with the patient to include physician time 40 minutes.      Bettey Mare. Lyman Bishop, NP      Madolyn Frieze. Jens Som, MD, [facility]  Electronically Signed    KML/MEDQ  D:  02/06/2009  T:  02/07/2009  Job:  [id number]   cc:   Maurice March, M.D.

## 2011-02-23 NOTE — Assessment & Plan Note (Signed)
[facility]                            CARDIOLOGY OFFICE NOTE   NAME:Callander, PETROS AHART                    MRN:          [id number]  DATE:12/13/2007                            DOB:          01-07-1951    Mr. Duff is seen today in followup.  To basically summarize, he has  recently been admitted to the hospital.  He had inappropriate firing of  his defibrillator, which the patient counts at approximately 9 times.  He subsequently had repositioning of his defibrillator lead by Dr.  Ladona Ridgel.  The biggest issue is that he has been really quite scared since  that time.  He had testing at the time of discharge by Dr. Ladona Ridgel, and  he returned today for followup.  Gunnar Fusi interrogated his device and  thought that the R wave was satisfactory.  After speaking with Dr.  Ladona Ridgel, they were reasonably pleased with those results.  However, the  patient says he has not been sleeping well and he has had difficulty  eating.  He has been quite nervous that the defibrillator might fire  again.  The patient had positive cardiac enzymes with CK-MB of 14.5  going down to 7.8 and a troponin of 0.83, 0.98 and 0.59.  His TSH was  normal.  None of this was thought to be highly significant.  A follow-up  chest x-ray compared to February 26 demonstrated resolution of  congestive heart failure, no pleural fluid or pulmonary edema and no  airspace opacities identified.  His suture lines have been healing  relatively well.  Fortunately, the patient says he stopped smoking about  4 weeks ago.   His ejection fraction is noted be about 55% with known hypokinesis of  the basal inferoposterior wall.   PHYSICAL EXAMINATION:  Today on examination, he looks reasonably well.  The blood pressure is 118/90, the pulse is 68.  The recent incision from reimplantation of his lead appears to be  healing well.  There is no erythema and it is covered by Steri-Strips.  There is no extremity edema.   There is no rub on examination.  The lung fields are clear.  ABDOMEN:  Soft with no evidence of rebound or tenderness, despite his  recent nausea and difficulty eating.  EXTREMITIES:  Reveal no edema.   ELECTROCARDIOGRAM:  Demonstrates sinus bradycardia with right bundle  branch block and inferior block.   TRANSCRANIAL DOPPLERS:  Suggest the possibility of apneic episodes with  high-amplitude shifts in blood flow from apneic to active respirations,  although we cannot find an official report from the hospital and there  is a question of obstructive sleep apnea.   IMPRESSION:  1. Known coronary artery disease with prior coronary revascularization      surgery.  2. History of syncope, status post implantation of implantable      defibrillator.  3. Recent inappropriate firing to the loss of threshold with      reimplantation by Dr. Ladona Ridgel.  4. History of tobacco use.  5. Known atheroma of the aorta.  6. History of hypertension.  7. Hypercholesterolemia, on lipid-lowering therapy.  RECOMMENDATIONS:  1. We will initiate a sleep study for the patient.  2. Followup with Dr. Lewayne Bunting will be mandated.  3. Continued reassurance at the present time.     Arturo Morton. Riley Kill, MD, [facility]  Electronically Signed    TDS/MedQ  DD: 12/14/2007  DT: 12/14/2007  Job #: [id number]

## 2011-02-23 NOTE — Consult Note (Signed)
NAMERAYMOUND, KATICH             ACCOUNT NO.:  [id number]   MEDICAL RECORD NO.:  [id number]          PATIENT TYPE:  INP   LOCATION:  1824                         FACILITY:  [facility]   PHYSICIAN:  Melvyn Novas, M.D.  DATE OF BIRTH:  [birth year]   DATE OF CONSULTATION:  11/06/2007  DATE OF DISCHARGE:                                 CONSULTATION   STROKE CONSULT   [name] is to be evaluated by the stroke service for  possible TIA versus stroke.  The patient was just on October 20, 2007  seen by my colleague, Dr. Franchot Gallo, in consultation.   The patient is known to neurology, for the syncope versus pre-syncopal  symptoms he usually has complained.  [name] is  an elderly 60-  year-old Caucasian male with a past medical history of hypertension,  hyperlipidemia and multi substance abuse, who looks older than his  numeric age by at least a decade.  The patient had just a loop monitor  implanted and is a now complaining about pain in the left chest,  shoulder radiating into his armpit.  There seems quite a bit of hematoma  to have accumulated at the incision site.  He states that loop recorder  was necessary to evaluate for cardiac syncope.  Today, the patient  states he suffers in the morning from dizziness.  Dizziness seems to be  mostly occurring when he has a postural change, either sits up or stands  up, and he felt that the vision on the left side was blurred.  He could  not see well and drifted to the left as he walked.  His daughter  witnessed him running into a wall on the left, colliding, and said that  afterwards she noticed that her father was not able to speak clearly,  trying to differentiate if he does have dysarthria or aphagia at this  point, as now all symptoms have resolved.  The patient states he could  just not get the words out, but they were in mind.  At this time, he  still says that his left extremity upper extremity feels somewhat  heavier. He has no longer any dysesthesias, and he is no longer dizzy.  He denied any vertigo, any nausea or vomiting, any headaches.  His pain  is restricted to the left chest and to the lower back.  He says he has  been on disability for degenerative disk disease and coronary artery  disease.  His daughters are at the bedside.  The patient is an active  smoker, but he denies any recent drug abuse.   CURRENT MEDICATIONS:  Lopressor, Valium, omeprazole, Lipitor, aspirin,  nitroglycerin, hydrochlorothiazide, Percocet as needed, Enalapril.   EXAMINATION:  VITAL SIGNS:  Blood pressure is 168/86.  EKG was just  obtained and interpreted as showing a possible old MI.  Heart rate is 82  and regular, respiratory rate is 20.  LUNGS:  Clear to auscultation, as the patient is sitting at rest.  I did  not hear a cardiac murmur.  No carotid bruits.  Temperature is 97.8,  except for the swelling  of the hematoma at the incision site.  There is  no other focal injury, bruising.  There is no rash.  The patient states  that he is always tender paraspinal at the lower back.  There is no  epigastric pain, and there is no pain in the extremities, no headache.   FAMILY HISTORY AND SOCIAL HISTORY:  The patient is on disability.  The  patient is alert, anxious, occasionally seems to stutter, but he does  not have aphagia or dysarthria.  He follows all motor commands.  Cranial  nerve examination shows a full visual field to the right and monocularly  impaired left-sided peripheral vision.  Again no facial droop, no  pupillary asymmetry.  Tongue and uvula midline.  There is no sensory  loss over the face, and there is no nystagmus, equal shoulder shrug but  painful over left shoulder is seen.  The patient's motor examination  shows also an equal grip.  Deep tendon reflexes are 1+, downgoing toes  bilaterally.  He can extend all extremities antigravity for over 5  seconds, possibly had a pronator drift when first  evaluated on the left,  which has meanwhile resolved.  He did some past-pointing for me on the  left and had a finger-nose dysmetria on the left.  Sensory is now intact  to primary modalities.  Gait is so far deferred.  CT was negative.   ASSESSMENT:  This could be a transient ischemic attack, but it could  also just has been another near-syncopal event.  The patient had  recently undergone an MRI of the brain before, implanting the loop  control recorder, which actually showed no abnormalities.  Also, not a  reported previous existing stroke.  The MRI was pristine, except for  white matter disease.   PLAN:  At this time, I would add __________  monitoring in the TCD, to  see that we are truly not dealing with repeated TIAs. A tox screen  should be obtained, given that the patient is on various  antihypertensive, presents with a systolic blood pressure of 168.  He is  also in pain and appears to be under the influence of some medication or  substance, as I examine him, so the tox screen is necessary.  A CT scan  obtained here in the ER was negative as I stated.  I do not think we  need to repeat an imaging study.  The Doppler studies should help [place], to  establish a risk-benefit ratio.  This patient also should probably  receive some kind of counseling about pain management and pain  medication management.  The patient was scheduled to follow up in the  office.  Dr. Dennie Fetters  dictated her consult on October 20, 2007.  She also  made a special note that the patient has a strong history of coronary  artery disease with degenerative disk disease that even affected a  younger daughter already.  At this time, I believe that this patient has  not suffered a stroke but is having trouble with blood pressure related  to postural pre-syncope, 780.2.      Melvyn Novas, M.D.  Electronically Signed     CD/MEDQ  D:  11/06/2007  T:  11/06/2007  Job:  [id number]

## 2011-02-23 NOTE — Assessment & Plan Note (Signed)
[facility]                         ELECTROPHYSIOLOGY OFFICE NOTE   NAME:Sidor, JSHON IBE                    MRN:          [id number]  DATE:02/01/2008                            DOB:          1951/05/07    Mr. Moure returns today for follow-up. He is a very pleasant middle-  aged man with syncope and ischemic cardiomyopathy and ventricular  tachycardia status post ICD insertion.  His procedure was initially  complicated by a pocket hematoma which resolved spontaneously and he had  microscopic dislodgement with very very small R-waves resulting in  inadvertant ICD firing.  He returns today for follow-up.  He has been  stable since.  He underwent defibrillator lead revision back in  February.   PHYSICAL EXAMINATION:  GENERAL:  He is a pleasant, well-appearing,  middle-aged man in no distress.  Blood pressure was 142/84, the pulse 65  and regular, the respirations were 18 and the weight was 165 pounds.  NECK:  Revealed no jugular distention.  LUNGS:  Clear bilaterally to auscultation.  No wheezes, rales or rhonchi  are present.  CARDIOVASCULAR:  Regular rate and right with normal S1 and S2.  EXTREMITIES:  Demonstrated no edema.  The ICD insertion site was healed  nicely with no residual drainage or hematoma or tenderness.   MEDICATIONS:  1. Aspirin 325 a day.  2. Lipitor 80 a day.  3. Protonix 40 a day.  4. HCTZ 25 a day.  5. Metoprolol 25 twice daily.  6. Enalapril 2.5 daily.  7. Potassium 20 mEq daily.  8. Zetia 10 mg daily.   On interrogation of his defibrillator, he has got a Medtronic Maximo.  The R-waves were 4, the impedance 384, the threshold 1 volt 0.3, the  battery voltage 3.2 volts.   IMPRESSION:  1. Ischemic cardiomyopathy.  2. Ventricular tachycardia.  3. Syncope thought secondary to #2.  4. Status post ICD insertion.   DISCUSSION:  Overall Mr. Deller is stable and his defibrillator is  working satisfactorily.  The  R-waves were down but not changed from his  implant. Will see him back in the office in January 2010.     Doylene Canning. Ladona Ridgel, MD  Electronically Signed    GWT/MedQ  DD: 02/01/2008  DT: 02/01/2008  Job #: [id number]

## 2011-02-23 NOTE — H&P (Signed)
[name], [name] NO.:  [id number]   MEDICAL RECORD NO.:  [id number]          PATIENT TYPE:  INP   LOCATION:  1824                         FACILITY:  [facility]   PHYSICIAN:  Jonelle Sidle, MD DATE OF BIRTH:  Sep 29, 1951   DATE OF ADMISSION:  11/06/2007  DATE OF DISCHARGE:                              HISTORY & PHYSICAL   PHYSICIANS:  Primary cardiologist Dr. Shawnie Pons,  electrophysiologist Dr. Lewayne Bunting.   PRIMARY CARE PHYSICIAN:  Dr. Dow Adolph.   REASON FOR ADMISSION:  Recent episode of lightheadedness and dysarthria  and also complaints of device pocket pain.   HISTORY OF PRESENT ILLNESS:  [name] is a pleasant 60 year old  gentleman with a history of hypertension, previous stroke approximately  six years ago, and coronary artery disease status post myocardial  infarction in 1986 and 2006 with previous coronary artery bypass  grafting.  He underwent recent coronary angiography on the 9th of  January revealing ejection fraction of 50% with patent radial to the  posterior descending, patent LIMA to the left anterior descending,  patent vein graft to the diagonal, and an occluded vein graft to the  circumflex.  This was felt to be stable compared to angiography in 2007.  The patient was managed medically.  His presentation at that time was  with an episode of syncope.  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   The patient is now brought back to the emergency department by his  daughter.  He states that around 7:45 this morning he began to drool  from the left side of his mouth uncontrollably.  He was eventually able  to get this to stop, and felt poorly.  He states that when he ambulated  he felt very lightheaded, and actually listed to the left running  into  the wall at one time.  He never had frank episode of syncope.  He denied  any chest pain or symptoms of palpitations.  He also had no device  discharge.  He ultimately was taken to the emergency department.  He has  actually been seen by the neurology service so far.  Basic cranial CT  scan was noncontributory for acute infarct or bleed.  It is felt that he  possibly had a TIA event.  The patient at this time is essentially back  to baseline.  He had some trouble with dysarthria initially, although  this is no longer the case, and he reports no longer feeling dizzy.  He  has not had any frank vertigo symptoms.  Rhythm and blood pressure have  been stable.  He was noted to have a temperature of 100.3 degrees at  presentation.   In addition to these complaints the patient states that he has had  increasing pain at his device pocket site particularly inferior to the  pocket and towards his left axilla.  The area has been indurated,  although it has not drained.  Steri-Strips remain in place.  ALLERGIES:  Reported as PROCARDIA, CODEINE, and INTRAVENOUS CONTRAST.   PAST MEDICAL HISTORY:  Is as described above.  Please see also the  recent History and Physical and Discharge Summary.   PRESENT MEDICATIONS AT HOME:  1. Enteric-coated aspirin 325 mg p.o. daily.  2. Valium 10 mg p.o. t.i.d. p.r.n.  3. Percocet 5/325 mg 1-2 tablets p.o. q.4-6 h.  4. Protonix 40 mg p.o. daily.  5. Lipitor 80 mg p.o. q.h.s.  6. Metoprolol 50 mg p.o. b.i.d.  7. Hydrochlorothiazide 25 mg p.o. daily.  8. Nitroglycerin 0.4 mg one q.5 minutes x3 p.r.n.   SOCIAL HISTORY:  The patient is disabled.  Has a history of substance  use including marijuana.  Stopped smoking cigarettes approximately 12  months ago.   FAMILY HISTORY:  Significant for cardiovascular disease in both parents.   REVIEW OF SYSTEMS:  Is described above.  In addition the patient denies  having any frank fevers or chills.  He has chronic  back pain.  Otherwise, systems are negative.   PHYSICAL EXAMINATION:  VITAL SIGNS:  Most recent temperature 98.3  degrees, heart rate 64, blood pressure 167/103 down to 140/90,  respirations 22, oxygen saturation 97% on room air.  GENERAL:  This is a normally nourished appearing male in no acute  distress, alert and oriented x3.  Has some hesitancy/deliberateness to  his speech, although words are appropriate.  [name] comments that  this is similar to what was noted at the patient's last presentation.  No frank dysarthria.  HEENT:  Conjunctivae was normal, oropharynx clear.  NECK:  Supple, no elevated jugular venous pressure. No bruits .  LUNGS:  Generally clear, diminished breath sounds, no wheezing.  CHEST:  Shows induration at the left upper thoracic device pocket site.  Steri-Strips are in place.  There is erythema/ecchymoses, some  fluctuance noted on palpation, tenderness at the edges of the device and  inferiorly, although there is no frank induration in this area.  No  purulence noted.  CARDIAC:  Reveals regular rate and rhythm, no loud murmur or S3 gallop.  ABDOMEN:  Soft, nontender, normoactive bowel sounds, no obvious  hepatomegaly.  EXTREMITIES:  Exhibit no significant pitting edema, pulses are 2+.  SKIN:  Warm and dry.  NEUROPSYCHIATRIC:  Affect appropriate.  Patient moves all extremities.   LABORATORY DATA:  INR 1.  WBC 15.7, hemoglobin 14.1, hematocrit 41.7,  platelets 228.  Other labs pending.   Chest x-ray pending.   IMPRESSION:  1. Defibrillator pocket induration associated with discomfort.  The      patient apparently had a hematoma following procedure, and it may      well be that he has a collection of serosanguineous fluid in the      area without frank infection.  He does, however, have a      leukocytosis, and had a low grade elevation in temperature at      presentation.  He has no frank purulence at the site.  2. Recent spell as outlined above.   Rule out transient ischemic      attack.  The patient had no frank device discharge or other      symptoms of palpitations.  I doubt arrhythmia, although the device      could be interrogated formally.  3. History of known cardiovascular disease with an ejection fraction      of 50% and some graft disease documented at recent angiography,      although not markedly progressed  in comparison to 2007 and felt to      be best managed medically.  Of note the patient did have inducible      ventricular tachycardia and ventricular fibrillation with resultant      defibrillator placement.  4. History of chronic back pain.   PLAN:  The patient will be admitted to telemetry unit.  I discussed the  case with Dr. Ladona Ridgel who will be seeing the patient in the hospital as  well.  At this point we anticipate checking blood and urine cultures and  giving 1 g of vancomycin IV.  Neurology has already seen the patient in  consultation, and will be following along for further assessment of the  patient's spells.  Agitated saline echocardiogram has been requested,  and we will arrange this in addition to carotid Dopplers and  transcranial Dopplers.  Further plans to follow per Dr. Ladona Ridgel.      Jonelle Sidle, MD  Electronically Signed     SGM/MEDQ  D:  11/06/2007  T:  11/06/2007  Job:  [id number]   cc:   Arturo Morton. Riley Kill, MD, [facility]  Doylene Canning. Ladona Ridgel, MD  Maurice March, M.D.
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
## 2011-02-23 NOTE — Op Note (Signed)
[name], [name]             ACCOUNT NO.:  [id number]   MEDICAL RECORD NO.:  [id number]          PATIENT TYPE:  OIB   LOCATION:  4706                         FACILITY:  [facility]   PHYSICIAN:  Doylene Canning. Ladona Ridgel, MD    DATE OF BIRTH:  October 14, 1950   DATE OF PROCEDURE:  10/23/2007  DATE OF DISCHARGE:                               OPERATIVE REPORT   PROCEDURE PERFORMED:  Electrophysiologic study followed by insertion of  an implantable cardioverter defibrillator (ICD).   INDICATIONS:  Coronary artery disease status post prior inferior  myocardial infarction with LV dysfunction, a history of sudden syncope  with inducible VT, VF and EP study.   The patient is a very pleasant 60 year old man who is status post  myocardial infarction approximately 10 years ago.  He has prior bypass  surgery.  He underwent catheterization demonstrating patent grafts and 3-  vessel disease.  He is now referred for invasive EP study.  If inducible  VT present,  ICD implantation.   PROCEDURE:  After informed consent was obtained, the patient was taken  to the diagnostic EP laboratory in a fasting state.  A usual preparation  and draping, intravenous fentanyl and midazolam were given for sedation.  A 5-French quadripolar catheter was inserted percutaneously into the  right femoral vein abdomen and advanced to the right ventricle.  A 5-  French quadripolar catheter was inserted percutaneously in the right  femoral vein and advanced to the His bundle region.  A 5-French  quadripolar catheter was inserted percutaneously in the right femoral  vein and advanced to the right atrium.  After measurement of the basic  intervals, rapid ventricular pacing was carried out from the RV apex at  a pacing cycle length of 600 msec and stepwise decreased down to 400  msec where VA Wenckebach was observed.  During rapid ventricular pacing,  the atrial activation sequence was midline and decremental.   Next programmed  ventricular stimulation was carried out from the RV apex  at the base with a cycle length of 600 msec.  The S1-S2 interval was  stepwise decreased from 500 msec, then 250 msec where ventricular  refractoriness was observed.  During programmed ventricular stimulation,  the atrial activation was midline and decremental.  Next S1-S2, S1, S2,  S3, and S1, S2, S3, S4 stimuli were delivered from the RV, both from the  RV apex as well as the RV outflow tract.  S1-S2, S2-S3, and S3-S4  intervals were stepwise decreased down to ventricular refractoriness.  At an S1, S2, S3, S4 coupling interval 400/220/210/260 there was  inducible VT.  This immediately degenerated into VF requiring external  defibrillation as it was hemodynamically unstable.   At this point, rapid atrial pacing was carried out demonstrating an AV  Wenckebach cycle length of 290 msec.  Programmed atrial stimulation was  carried out at a base drive cycle length of 161 msec from the right  atrium with an S1-S2 interval stepwise decreased down to 170 msec where  the AV node ERP was observed.  During programmed atrial stimulation  there were no AH jumps and no  echo beats noted.  At this point the  catheters were removed, and the patient was prepped for ICD  implantation.   After the usual preparation and draping, intravenous fentanyl and  midazolam  was given for some more sedation.  Lidocaine 30 cc was  infiltrated in the left infraclavicular region.  A 7-cm incision was  carried out over this region.  Electrocautery was utilized to dissect  down to the fascial plane.  The left subclavian vein was sharply  punctured and the Medtronic Sprint Quattro secure model [phone] active  fixation defibrillation lead, serial number [id number] being advanced  into the right ventricle.  Mapping in the right ventricle was carried  out.  At the final site, the R-waves measured 11 mV and there was a  large injury current noted.  The pace impedance was  750 ohms and the  threshold was 0.2 volts at 0.5 msec.  The 10 volt pacing did not  stimulate the diaphragm.   With these satisfactory parameters, the lead was secured to the  subpectoralis fascia with a __________ soaked silk suture.  The  __________ sleeve was also secured with silk suture.  Electrocautery was  utilized to make a subcutaneous pocket.  Kanamycin irrigation was  utilized to irrigate the pocket.  Electrocautery was utilized to assure  hemostasis.  The Medtronic Maximo Model 7232 single-chamber  defibrillator serial number [id number] was connected to the  defibrillation lead and placed back in the subcutaneous pocket.  The  generator was secured with silk suture.  Additional kanamycin was then  utilized to irrigate the pocket and defibrillation threshold testing  carried out.   After the patient was more deeply sedated with fentanyl and Versed, VF  was induced with a T-wave shock and a 15-joule shock was delivered which  terminated VF and restored sinus rhythm.  With this 20-joule safety  parameter, no additional defibrillation threshold testing was carried  out and the incision was closed with a layer of 2-0 Vicryl followed by a  layer of 3-0 Vicryl.  Benzoin was painted on the skin, Steri-Strips were  applied, and compression dressing was placed.  The patient was returned  to his room in satisfactory condition.   COMPLICATIONS:  There were no immediate complications.   RESULTS:  This demonstrates successful implantation of a Medtronic  single-chamber defibrillator with satisfactory defibrillation threshold  margins.  This was carried out after the patient underwent invasive EP  study demonstrating inducible ventricular tachycardia which quickly  degenerated into VF.      Doylene Canning. Ladona Ridgel, MD  Electronically Signed     GWT/MEDQ  D:  10/23/2007  T:  10/24/2007  Job:  [id number]   cc:   Everardo Beals. Juanda Chance, MD, [facility]  Maurice March, M.D.  Arturo Morton. Riley Kill, MD,  [facility]

## 2011-02-23 NOTE — Assessment & Plan Note (Signed)
[facility]                            CARDIOLOGY OFFICE NOTE   NAME:[name], [name]                    MRN:          [id number]  DATE:11/17/2007                            DOB:          1950-12-22    [name] is in for follow-up.  In general he is stable.  The last  time I had seen him, it was 2007.  He does continue to smoke, and did  quit.  He was recently admitted to the hospital, and underwent re-  admission.  He had a syncopal spell without warning.  He then underwent  cardiac catheterization by Dr. Juanda Chance.  At cath, he had severe native  vessel disease with an 80% stenosis in the proximal mid LAD, 90%  stenosis the first large diagonal, 30% in the mid and distal circumflex,  and total occlusion of the right.  He had patent radial graft to the  posterior descending branch of the right coronary, patent left internal  mammary to the LAD, patent vein graft to the diagonal, and occluded vein  graft to the circumflex.  There was inferobasal and mid inferior wall  akinesis with an estimated ejection fraction of 50%.  He was  subsequently seen by Dr. Lewayne Bunting and underwent electrophysiologic  testing.  He then underwent implantation of Medtronic single chamber  defibrillator with satisfactory defibrillation thresholds.  He was also  seen then ultimately with what was felt to be a pocket hematoma.  He had  lightheadedness and dysarthria.  He was subsequently seen by Dr.  [name] in consultation.  The exact etiology of his findings were  unclear at that time.  Apparently has had a Doppler study.  He says he  still feels occasionally dizzy but is improved.   EXAM:  The blood pressure was 139/85, both supine and standing.  The  patient was somewhat animated.  The pulse 53.  The lung fields are clear.  The median sternotomy is well-healed.  The cardiac rhythm is regular.   Electrocardiogram demonstrates sinus bradycardia.  There is a right  bundle branch block and inferior infarct of indeterminate age.   The patient is scheduled to be seen in follow-up by neurology.  He needs  to remain well hydrated.  He is on multidrug regimen that includes beta-  blockers, Atacand, hydrochlorothiazide only, and Protonix.  We will get  laboratory studies on him in a few weeks.  We will await his follow-up  with Dr. [name].     Arturo Morton. Riley Kill, MD, [facility]  Electronically Signed    TDS/MedQ  DD: 12/03/2007  DT: 12/03/2007  Job #: [id number]

## 2011-02-23 NOTE — H&P (Signed)
[name], SPAGNOLO             ACCOUNT NO.:  [id number]   MEDICAL RECORD NO.:  [id number]          PATIENT TYPE:  INP   LOCATION:  2908                         FACILITY:  [facility]   PHYSICIAN:  Arturo Morton. Riley Kill, MD, FACCDATE OF BIRTH:  1951-09-21   DATE OF ADMISSION:  12/07/2007  DATE OF DISCHARGE:                              HISTORY & PHYSICAL   PRIMARY CARDIOLOGIST:  Arturo Morton. Riley Kill, MD, [place].   ELECTROPHYSIOLOGY:  Doylene Canning. Ladona Ridgel, M.D.   PRIMARY CARE PHYSICIAN:  Was Dr. Audria Nine at [facility].  The patient  still goes to [facility]; I am not sure if Dr. Audria Nine is still  there.   [name] is a 60 year old Caucasian gentleman with known history of  coronary artery disease, status post CABG in [place] in 2006.  Most  recent catheterization here in January showed patent grafts at that time  with recommendations to continue medical therapy.  Catheterization was  done secondary to history of syncope.  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  Discharged home in stable condition.  Today while at home  had multiple episodes of ICD firing per patient's report.  He states he  had just been doing some light housework and had been complaining of  increased fatigue, dizziness and feeling achy for the last few days.  Thought he was getting the flu.  He was walking up 3 steps today when  his ICD fired.  He states it knocked him to the ground.  EMS was called.  Initially the patient was transported and deemed a code STEMI; however,  after review of old EKGs, previous inferior infarct with right bundle  branch block was old.  Code STEMI was cancelled.  The patient placed in  the emergency department, currently stable.  [name] also complains  of chest discomfort last night.  He states he was restless.  He  describes it as a sharp pressure in his chest, worse when he takes a  deep breath.  Currently  complaining of chest pain that is reproducible  with palpation, significant soreness in his chest around his ICD site.   ALLERGIES:  PROCARDIA, CODEINE, IVP DYE.   MEDICATIONS:  Aspirin 325, Valium 10 b.i.d., Percocet 5/325 one to two  p.o. q.6 h,  Prilosec 20 mg daily, Lipitor 80 mg q.h.s., metoprolol 50  mg b.i.d.,  HCTZ 25 mg daily.  The patient states he takes enalapril,  which was prescribed by [facility], unclear of dose at this time.   PAST MEDICAL HISTORY:  1. Includes coronary artery disease, status post CABG in [place],      [place] in 2006 with a left internal mammary artery to the      LAD, saphenous vein graft to the diagonal, saphenous vein graft to      the right coronary artery, saphenous vein graft to the circumflex      (saphenous vein graft to the circumflex totaled at cath in 2006, no      new change).  2. Hypertension.  3. Hyperlipidemia.  4. Ongoing tobacco use.  Note the patient states he quit recently but      started back.  5. History of cocaine use and marijuana.  6. Remote history of ETOH abuse years ago.  7. History of bipolar disorder.  8. Degenerative joint disease with chronic back pain.  9. History of syncope, felt to be secondary to V Tach, status post      Medtronic Maximo implantation.  10.A 2-D echocardiogram January of 2009 showed an EF of 55% with      hypokinesis of the basal inferior posterior wall.   SOCIAL HISTORY:  The patient lives in [place] with his son.  He is  disabled with a history of cocaine and marijuana use but denies any in  recent years.  Previously had a negative drug screen in January.  Denies  any ETOH use in about 20 years.  Has adult children.  Tobacco use:  Recently resumed smoking.  No exercise.  Stressed following a heart-  healthy diet.   FAMILY HISTORY:  Mother deceased secondary to complications of CAD.  Father deceased secondary to complications of CAD.  Unknown sibling  history.   REVIEW OF  SYSTEMS:  Positive for fever, chills, chest pain, dizziness,  cough, numbness in top of head for the last few days, generalized  weakness, achy feeling, generalized myalgia, arthralgia and pain in  back, nausea today and heat intolerance, which is not new.  All other  systems reviewed and negative.   PHYSICAL EXAMINATION:  VITAL SIGNS:  The patient is afebrile.  Heart  rate 72, respirations 20, blood pressure initially 151/100, currently  126/80.  GENERAL:  In no acute distress.  A 60 year old Caucasian gentleman,  appearing much older than stated age.  HEENT:  Normal.  NECK:  Supple without lymphadenopathy.  No bruits.  No JVD.  CARDIOVASCULAR:  Exam reveals S1, S2.  Very tender to palpation in chest  area.  LUNGS:  Clear to auscultation.  SKIN:  Warm and dry.  Multiple tattoos on bilateral upper extremities.  ABDOMEN:  Soft, nontender.  Positive bowel sounds.  EXTREMITIES:  Lower extremities without clubbing, cyanosis or edema.  NEUROLOGIC:  Alert and oriented x3.   Chest x-ray showing mild CHF, right lower lobe atelectasis, infiltrate.  EKG sinus rhythm with a right bundle branch block, old inferior infarct.  Rate of 80.   Lab work:  H&H 15.3 and 40.  Sodium 137, potassium 4.2, BUN 11,  creatinine 1.5, glucose 113.  Note these are I-STAT labs.  CMET is  pending.  Point of care:  First set is negative.  PT 13.6, INR 1.0.   IMPRESSION:  1. Chest pain, reproducible with palpation.  2. Questionable implantable cardioverter defibrillator firing.   The plan is to admit the patient to the CCU, have device interrogated,.  check a CMET, continue home medications and have EP evaluate in the a.m.  Dr. Bonnee Quin in to assess the patient, agrees with plan of care.      [name], ACNP      Arturo Morton. Riley Kill, MD, [facility]  Electronically Signed    MB/MEDQ  D:  12/07/2007  T:  12/08/2007  Job:  [id number]

## 2011-02-23 NOTE — H&P (Signed)
[name], [name]             ACCOUNT NO.:  [id number]   MEDICAL RECORD NO.:  [id number]          PATIENT TYPE:  OBV   LOCATION:  3733                         FACILITY:  [facility]   PHYSICIAN:  Madolyn Frieze. Jens Som, MD, FACCDATE OF BIRTH:  May 10, 1951   DATE OF ADMISSION:  05/28/2009  DATE OF DISCHARGE:                              HISTORY & PHYSICAL   PRIMARY CARDIOLOGIST:  Arturo Morton. Riley Kill, MD, [facility]   PRIMARY CARE PHYSICIAN:  Wyline Mood in [place].   CHIEF COMPLAINT:  Chest pain.   HISTORY OF PRESENT ILLNESS:  [name] is a 60 year old male with a  long history of chest pain.  He also has a history of coronary artery  disease.  He was last cathed in April 2010 and medical therapy was  recommended.   [name] has had intermittent episodes of exertional chest pain,  relieved with sublingual nitroglycerin x1.  Last p.m., he had onset of  substernal chest pain.  It was his usual chest pain.  His symptoms  worsened and he tried a sublingual nitroglycerin last night with some  relief, but not complete relief.  He did not get much sleep.  His  symptoms lasted all night.  Today, they worsened and he tried sublingual  nitroglycerin x3 with no relief.  He describes the symptoms as a severe  pressure.  It radiates to the back of his neck and both shoulder blades.  He has some nausea and shortness of breath, but no diaphoresis.  It is  worse with movement, with deep inspiration, and position change.  It has  been continuous since last p.m.  In the emergency room, he has received  4 mg of morphine and is on IV nitroglycerin drip.  He still complains of  severe pain.  He states the pain is the same as prior to his bypass  surgery and also the same as prior to his last heart catheterization.   PAST MEDICAL HISTORY:  1. Status post cardiac catheterization on February 04, 2009, showing LIMA      to LAD patent, SVG to diagonal patent, SVG to PDA patent,      circumflex patent, SVG to  OM occluded.  2. Preserved left ventricular function with an EF of 55% by      echocardiogram in January 2009.  3. Hypertension.  4. History of MI in 1986 and 2006.  5. Status post Medtronic ICD secondary to VT.  6. Degenerative disk disease with chronic back pain.  7. Ongoing tobacco abuse.  8. Bipolar disorder.  9. Remote history of cocaine EtOH and THC abuse.  10.Hyperlipidemia.  11.History of syncope.  12.History of GI bleed and gastroesophageal reflux disease.   SURGICAL HISTORY:  He is status post cardiac catheterizations as well as  bypass surgery and ICD.   SOCIAL HISTORY:  He lives in [place] with his fiancee.  He is disabled.  He has approximately 40 pack-year history of ongoing tobacco use and  states that he is not currently using alcohol or drugs.   FAMILY HISTORY:  His mother died of an MI at 102.  His father  died at age  11 from an MI.  His sister died in her 39s of a GI bleed.   ALLERGIES:  CODEINE and PROCARDIA.   CURRENT MEDICATIONS:  1. Benicar HCT 25/12.5 daily.  2. Cetirizine 10 mg a day.  3. Chantix, taken intermittently.  4. Flexeril 10 mg nightly.  5. Valium 10 mg t.i.d.  6. Fluticasone nasal spray nightly.  7. Klor-Con 20 mEq daily.  8. Lipitor 80 mg a day.  9. Metoprolol 50 mg b.i.d.  10.Nitroglycerin spray.  11.Omeprazole 20 mg a day.  12.Oxycodone 7.5/325 q.6 h. p.r.n.  13.Proventil HFA p.r.n.  14.Zetia 10 mg a day.  15.Tramadol 50 mg t.i.d. p.r.n.   REVIEW OF SYSTEMS:  His legs have been itching for multiple flea bites.  He has chronic arthralgias and back pain.  He has anxiety and occasional  reflux symptoms, but denies melena or dark stools.  Full 14-point review  of systems is otherwise negative except as stated in the HPI.   PHYSICAL EXAMINATION:  VITAL SIGNS:  Temperature is 98.4, blood pressure  initially 161/112, pulse 97, respiratory rate 22, O2 saturation 98% on 2  L.  GENERAL:  He is a well-developed, well-nourished white male  who appears  anxious, but not in acute distress.  HEENT:  Normal.  NECK:  There is no lymphadenopathy, thyromegaly, or bruits noted, but he  does have some JVD.  CV:  His heart is regular in rate and rhythm with an S1 and S2.  No  significant murmur, rub, or gallop is noted.  Distal pulses are intact  in all four extremities.  LUNGS:  He has a few rales in the bases that decrease with cough, but  otherwise no wheezes or rhonchi or crackles are heard.  SKIN:  No rashes are noted, and the flea bites noted on his lower  extremities do not appear to be acutely infected.  ABDOMEN:  Soft and nontender with active bowel sounds.  EXTREMITIES:  There is no cyanosis, clubbing, or edema noted.  MUSCULOSKELETAL:  There is no joint deformity or effusions and no spine  or CVA tenderness is noted, but his chest wall is tender to palpation.  NEUROLOGIC:  He is alert and oriented.  Cranial nerves II-XII grossly  intact.   Chest x-ray:  No acute disease.   EKG:  Sinus rhythm with PVCs rate 101 and a right bundle-branch block is  seen, which is old.   LABORATORY VALUES:  Hemoglobin 14, hematocrit 41.8, WBCs 10.1, platelets  243.  Sodium 143, potassium 3.8, chloride 110, BUN 13, creatinine 1.5,  glucose 112.  Point-of-care markers negative x1.  Urinalysis negative.   IMPRESSION:  [name] was seen today by Dr. Jens Som.  He is a 40-  year-old male with a past medical history of coronary artery disease  including bypass surgery, hypertension, bipolar disorder, and chronic  pain who has chest pain.  His last catheterization was in April 2010 at  which time he had a patent left internal mammary artery graft to left  anterior descending, patent saphenous vein graft to diagonal, and patent  saphenous vein graft to posterior descending artery.  There was an  occluded saphenous vein graft to obtuse marginal, but the circumflex  itself was patent.  Medical therapy was recommended.  He developed chest   pain at approximately 8 p.m. last night, described as a heaviness that  radiates to his neck.  It has been continuous and increased with certain  movements.  He  has had some nausea, shortness of breath, and  diaphoresis.  His EKG shows sinus rhythm with a right bundle-branch  block and premature ventricular contractions as well as an old inferior  myocardial infarction.  His initial cardiac enzymes are negative.  His  chest pain is reproducible with palpation.  His chest pain is most  likely musculoskeletal.  We will rule out myocardial infarction with  serial cardiac enzymes.  He will be continued on his home medication  including aspirin, beta-blocker, ACE, and statin.  He will be continued  on his home pain medications and antianxiety drugs as well.  If his  cardiac enzymes are negative, no further cardiac workup is indicated  because of her recent catheterization with no critical distal disease.  He is encouraged to discontinue tobacco and will be restarted on Chantix  at his request.      Theodore Demark, PA-C      Madolyn Frieze. Jens Som, MD, [facility]  Electronically Signed    RB/MEDQ  D:  05/28/2009  T:  05/29/2009  Job:  [id number]

## 2011-02-23 NOTE — Cardiovascular Report (Signed)
NAMEGARI, HARTSELL             ACCOUNT NO.:  [id number]   MEDICAL RECORD NO.:  [id number]          PATIENT TYPE:  INP   LOCATION:  2908                         FACILITY:  [facility]   PHYSICIAN:  Everardo Beals. Juanda Chance, MD, FACCDATE OF BIRTH:  [birth year]   DATE OF PROCEDURE:  10/20/2007  DATE OF DISCHARGE:                            CARDIAC CATHETERIZATION   CLINICAL HISTORY:  Mr. Bergen is 60 years old and had coronary bypass  surgery in [place], [place], done two years ago.  He was last  studied by Dr. Riley Kill in 2007, at which time he had occluded vein graft  to the circumflex artery.  He was recently admitted with an episode of  syncope and chest pain.  His troponins were normal and his  electrocardiogram showed right bundle branch block and an old inferior  infarction.  He had a prior history of a myocardial infarction in 1986  and 2006.  He was scheduled for evaluation angiography yesterday but  because of contrast allergy and lack of premedication, this was deferred  until today.  He was premedicated with steroids.   PROCEDURE:  Left heart catheterization was performed percutaneously via  the right femoral arteries and arterial sheath and 6-French preformed  coronary catheters.  A front wall arterial puncture was and Omnipaque  contrast was used.  A LIMA catheter was used for injection of the LIMA  graft.  The right femoral artery was closed with Angio-Seal at the end  of the procedure.  The patient tolerated the procedure well and left the  laboratory in satisfactory condition.   RESULTS:  Left main coronary artery.  The left main coronary artery was  free of significant disease.   Left anterior descending artery:  The left anterior descending artery  gave rise to three septal perforators and a diagonal branch and there  was competing flow in the diagonal branch and distal LAD.  There was a  long area of segmental narrowing in the proximal to mid LAD of 80%.  There was  a 90% lesion at the ostium of the large diagonal branch.   Circumflex artery:  The circumflex artery gave rise to a marginal  branch, an atrial branch and two posterolateral branches.  There was 30%  narrowing in the mid circumflex artery.  There was 30% narrowing in the  distal circumflex artery just before the two posterolateral branches.   Right coronary artery:  The right coronary artery was completely  occluded at its midportion.   The free radial graft to the posterior descending branch of the right  groin was patent and functioned well.   The LIMA graft to LAD was patent and functioned well.   The saphenous vein graft to the diagonal branch of the LAD was patent  and functioned well.   The saphenous vein graft to the circumflex artery was completely  occluded at its origin.   Left ventriculogram:  The left ventriculogram performed in the RAO  projection showed akinesis of the inferobasal and mid inferior wall.  Apex in the anterolateral wall moved well.  The estimated fraction was  50%.  Distal aortogram:  A distal aortogram was performed which showed patent  renal arteries and no significant aortoiliac obstruction.   The aortic pressure was 146/92 with a mean of 113.  The left ventricular  pressure was 146/17.   CONCLUSION:  1. Coronary artery status post coronary bypass graft surgery 2006.  2. Severe native vessel disease with 80% stenosis in the proximal to      mid left anterior descending, 90% stenosis in the first large      diagonal branch of the left anterior descending, 30% narrowing in      the mid and distal circumflex artery and total occlusion of right      coronary artery.  3. Patent radial graft to the posterior descending branch of the right      coronary, patent left internal mammary artery graft to the left      anterior descending, patent vein graft to the diagonal branch of      the left anterior descending, and occluded vein graft to the       circumflex system.  4. Inferobasal and mid inferior wall akinesis with an estimated      ejection fraction of 50%.   RECOMMENDATIONS:  The patient's anatomy has not changed since his  catheterization performed in 2007.  There does not appear to be any  source of ischemia.  Will plan continued secondary risk factor  modification for this.  The etiology of the syncopal episode is not  clear.  He had a CT scan and the results are pending.  I will discuss  with the daughter regarding the history of a syncopal episode and will  decide about further evaluation.      Bruce Elvera Lennox Juanda Chance, MD, [facility]  Electronically Signed     BRB/MEDQ  D:  10/20/2007  T:  10/20/2007  Job:  [id number]   cc:   Maurice March, M.D.  Cardiopulmonary Lab

## 2011-02-23 NOTE — Cardiovascular Report (Signed)
[name], CROSSON             ACCOUNT NO.:  [id number]   MEDICAL RECORD NO.:  [id number]          PATIENT TYPE:  OIB   LOCATION:  2899                         FACILITY:  [facility]   PHYSICIAN:  Arturo Morton. Riley Kill, MD, FACCDATE OF BIRTH:  04/19/1951   DATE OF PROCEDURE:  DATE OF DISCHARGE:  09/03/2008                            CARDIAC CATHETERIZATION   INDICATIONS:  Mr. Cheek is a 59 year old gentleman who presents with  recurrent chest discomfort.  He has had a long history of chest  discomfort that is difficult to define.  He last underwent  catheterization in January 2009.  He has had prior bypass surgery in  2006, but unfortunately he has continued to smoke.  He also has a  defibrillator.   PROCEDURE:  1. Selective coronary arteriography.  2. Saphenous vein graft angiography.  3. Selective left internal mammary angiography.  4. Placement of catheters for angiography.   DESCRIPTION OF PROCEDURE:  The patient was brought to the Cath Lab and  prepped and draped in the usual fashion.  Through an anterior puncture,  the femoral artery was entered.  Femoral artery was fairly stiff,  feeling as though it is calcified.  We were able to enter and a 5-French  sheath was then placed.  We then took views of the left and right  coronary arteries of all saphenous vein grafts, and the internal mammary  vessel and IMA catheter was used for the IMA, and right bypass for the  RCA graft as it was somewhat difficult to get into.  The patient  tolerated procedure well and there were no complications.   HEMODYNAMIC DATA:  The central aortic pressure was 171/104, mean 133.  Intravenous labetalol was given just after this.   ANGIOGRAPHIC DATA:  1. The left main is free of critical disease.  2. The LAD has diffuse 90% narrowing after the septal perforator with      50% narrowing proximal to this.  There is competitive filling of      the vessel distally.  3. Internal mammary to the LAD is  widely patent and functions      normally.  4. The saphenous vein graft to diagonal is widely patent and functions      normally.  5. The circumflex demonstrates some segmental lesions in 40% in the      proximal and midportions.  Distally, there is about 30-40%      narrowing.  It is a large-caliber vessel.  6. The saphenous vein graft to the OM system is occluded as on the      previous study.  7. The right coronary artery is totally occluded in its midportion.  8. Saphenous vein graft to PDA is intact.  There is some mild tapered      narrowing as its insertion, but it appears to function normally.   CONCLUSION:  1. Continued patency of the internal mammary to the LAD.  2. Continued patency of the saphenous vein graft to the diagonal.  3. Continued patency of the saphenous vein graft to PDA.  4. Occluded saphenous vein graft to the circumflex with essentially  widely patent circumflex vessel.   DISPOSITION:  Discontinuation of cigarette smoking and continued medical  therapy will be recommended.  I will see him back in the office in few  weeks.      Arturo Morton. Riley Kill, MD, [facility]  Electronically Signed     TDS/MEDQ  D:  09/03/2008  T:  09/03/2008  Job:  [id number]

## 2011-02-23 NOTE — H&P (Signed)
[name], [name]             ACCOUNT NO.:  [id number]   MEDICAL RECORD NO.:  [id number]          PATIENT TYPE:  INP   LOCATION:  1832                         FACILITY:  [facility]   PHYSICIAN:  Darryl D. Prime, MD    DATE OF BIRTH:  12/20/1950   DATE OF ADMISSION:  10/18/2007  DATE OF DISCHARGE:                              HISTORY & PHYSICAL   The patient is Full Code.  The patient's total visit time is 50 minutes.   CARDIOLOGIST:  Arturo Morton. Riley Kill, MD, [facility]   The patient was the historian.   PRIMARY CARE PHYSICIAN:  Maurice March, MD   CHIEF COMPLAINT:  Passed out and having severe  chest pain x3-4 days.   HISTORY OF PRESENT ILLNESS:  [name] is a 60 year old male with a  history of coronary artery disease status post MI in 4 and in 2006.  He presented with an apparent syncopal episode for his MI he notes in  2006.  He presents with chest pain and passing out today.  The patient  per his old medical record, has had MI in August of 2007, an acute  inferior MI, but had a large maroon stool at that time as well.  The  patient at the time of that hospitalization had a cardiac  catheterization procedure after he was evaluated by GI.  On June 10, 2006  he had a nuclear test that showed a fixed defect in the anterior  wall and an ejection fraction of 42%.  He also had an inferior lateral  wall fixed defect, but no evidence of ischemia.  He had global  hypokinesis.  The patient during the course of his acute inferior MI in  August 2007 during the hospitalization had a cardiac catheterization  procedure which showed an ejection fraction of greater than 60% with  inferior basal wall motion abnormalities, proximal 90% LAD lesion, and  left circumflex 78% lesion, mid portion, and an 80% to 90% lesion in  small obtuse marginal branch.  The right coronary artery was totally  occluded, which was a new finding from his original cath previous to his  bypass surgery.  The  LIMA to the LAD is widely patent.  The vein graft  to the diagonal was patent, and the vein graft to the PDA had a 50%  stenosis at the insertion to the PDA, but no acute culprit lesion found  at that time and he was treated medically.  The patient notes over the  last 3-4 days, however, significant chest pain, 4-5 episodes, severe,  lasting 10-15 minutes and he notes sublingual nitroglycerin initially  has been helping, but has not been helping as much.  The patient notes  he has continued to take his medications and he denies taking any  illicit drugs, for which he has a history of in the past.  The patient  notes today, approximately 1 p.m. while sitting at a computer with his  daughter, printing out pictures from the computer, that he fell out.  He  lost consciousness for a few minutes.  He notes when he came to  he was  on the floor with his daughter checking his blood pressure.  He notes  his blood pressure was 100/60, where it is usually 180 to 190/100 to  110.  The patient notes he forgot his appointment in December 2008.  He  has been recently forgetful.  The patient notes since the passing out  episode he has had mild chest pain.  The patient took his morning meds.  He notes never passed out like this since the time of his heart attack  in 2006.   PAST MEDICAL HISTORY AND PAST SURGICAL HISTORY:  As above, with coronary  artery disease.  He also has a history of degenerative joint disease, L1-  5 on chronic pain mediations, history of severe, uncontrolled  hypertension, history of polysubstance abuse of cocaine and marijuana.  History of bipolar disorder.  History of GI bleed as above.   ALLERGIES:  He is allergic to [facility] which he notes the tongue seems  to become purple.  He is also allergic to CODEINE.   MEDICATIONS:  1. He is on Lopressor 50 mg now twice a day.  2. Valium 10 mg, now twice a day.  3. He is on omeprazole, unsure of the exact dose.  4. Lipitor 80 mg  daily.  5. Aspirin 325 mg daily.  6. Nitroglycerin sublingual as needed.  7. Hydrochlorothiazide 25 mg daily.  8. Percocet 5/325 as needed.   SOCIAL HISTORY:  He notes discontinued tobacco 5 weeks ago.  History of  1 pack per day since the age of 12.  No current alcohol, heavy in the  past.  Discontinued in 1986.  He denies any recent illicit drug use. He  is currently filing for disability.  He used to be a Advice worker.   FAMILY HISTORY:  Positive for grandfather maternally passed away at 81  due to myocardial infarction.  Mother had a myocardial infarction that  took her life as well, and also the father had a myocardial infarction  at the age of 57 and he died subsequent to this.   REVIEW OF SYSTEMS:  Fourteen-point review of systems negative unless  stated above.  He denies any recent black stools or bloody stools.   PHYSICAL EXAMINATION:  VITAL SIGNS:  Temperature is 97 with a pulse of  51, respiratory rate with a blood pressure of 175/100.  Sats are 100% on  2 liters and 90% on room air.  GENERAL:  He is a thin male who looks chronically ill and mildly clammy,  in no acute distress.  HEENT:  Normocephalic, atraumatic.  His pupils are equal round, reactive  to light.  Anicteric sclerae. Oropharynx is dry.  NECK:  Supple.  No lymphadenopathy or thyromegaly.  No carotid bruits.  CARDIOVASCULAR:  Regular rhythm and rate, with no murmurs, rubs, or  gallops.  Normal S1 and S2.  No S3 or S4.  LUNGS:  Clear to auscultation bilaterally.  ABDOMEN:  Soft, nontender, nondistended with no hepatosplenomegaly.  EXTREMITIES:  Show no clubbing, cyanosis, or edema.  MUSCULOSKELETAL:  Shows no joint deformities or effusions.  No CVA  tenderness.  NEUROLOGIC:  He is alert and oriented x4.  Cranial nerves 2-12 grossly  intact.  Strength and sensation is grossly intact.  VASCULAR:  2+ femorals with no bruits.   Chest x-ray is pending.  EKG shows sinus brady at about 51.  Right  bundle  branch block with normal axis, 1st degree AV block to 194.  QRS  is  142.  QT corrected 444 with signs of a significant inferior infarct.  No major change from June 21, 2006 EKG.  Labs showed a white count  of 9.4 with a hemoglobin of 16.2, hematocrit 44.9, with platelets of  234, normal differential, and C9 3.4.  Sodium 140, potassium 4.2,  chloride 105, bicarb of 10, creatinine 1.1, glucose 89.  INR 1.0, PT  13.2.  Cardiac markers, first set at 2059 showed troponin of less than  0.05 and a MB of 1.5.  ABG shows pH of 7.34, CO2 of 52.8.   ASSESSMENT AND PLAN:  This is a patient that has a history of  significant coronary artery disease status post myocardial infarction,  with a history of gastrointestinal bleed who now presents with syncope  and chest pains with concerns for significant acute coronary syndrome  and unstable angina.  His heart rate is relatively low.  He will be  admitted to the CCU on telemetry with serial EKGs and cardiac markers.  Will hold his beta blocker given his bradycardia.  Will check urine drug  screen and Hemoccult stools.  He will be placed on heparin, aspirin, and  the statin, and he will likely need an ejection fraction assessment.  The patient, given his possible unstable angina, will be held n.p.o. for  possibly coronary blood flow assessment in the morning.  Will continue  his benzodiazepines for his anxiety, Percocet for his pain.  Deep venous  thrombosis prophylaxis, he is on heparin; gastrointestinal prophylaxis  with a proton pump inhibitor intravenously given as gastrointestinal  bleed history and that he will now be on heparin.  Will give high dose  nitroglycerin to control his blood pressure.      Darryl D. Prime, MD  Electronically Signed     DDP/MEDQ  D:  10/18/2007  T:  10/19/2007  Job:  [id number]   cc:   Maurice March, M.D.

## 2011-02-23 NOTE — Discharge Summary (Signed)
NAMECOLTON, [name]             ACCOUNT NO.:  [id number]   MEDICAL RECORD NO.:  [id number]          PATIENT TYPE:  INP   LOCATION:  3733                         FACILITY:  [facility]   PHYSICIAN:  Verne Carrow, MDDATE OF BIRTH:  [birth year]   DATE OF ADMISSION:  05/28/2009  DATE OF DISCHARGE:  05/29/2009                               DISCHARGE SUMMARY   PRIMARY CARDIOLOGIST:  Maisie Fus D. Riley Kill, MD, [facility]   PRIMARY CARE Lori-Ann Lindfors:  Junious Dresser, MD   DISCHARGE DIAGNOSIS:  Atypical chest pain.   SECONDARY DIAGNOSES:  1. Coronary artery disease, status post previous coronary artery      bypass grafting with recent catheterization on February 04, 2009,      revealing 3/4 patent grafts.  2. Hypertension.  3. Hyperlipidemia.  4. History of ventricular tachycardia, status post Medtronic automatic      implantable cardioverter-defibrillator.  5. Chronic pain.  6. Degenerative disk disease.  7. Ongoing tobacco abuse.  8. Bipolar disorder.  9. Remote history of cocaine, EtOH, and THC abuse.  10.Hyperlipidemia.  11.History of syncope.  12.History of gastrointestinal bleeding.   ALLERGIES:  CODEINE and PROCARDIA.   PROCEDURES:  None.   HISTORY OF PRESENT ILLNESS:  A 60 year old Caucasian male with the above  problem list.  He has a long history of chest pain with multiple  catheterizations, the last of which took place in April 2010 revealing  3/4 patent grafts with medical therapy recommended.  He has continued to  have intermittent episodes of rest and exertional chest pain, sometimes  relieved with nitroglycerin.  On the evening prior to admission, he had  onset of substernal chest pain, which was similar to his previous  symptoms.  He took nitroglycerin without much relief and then  subsequently did not get much sleep.  He continued to have constant pain  and pressure, and took multiple nitroglycerins on the day of admission  and subsequently presented to the [facility] ED.   There, he received  morphine and IV nitroglycerin, but continued to have severe chest pain.  His ECG showed no acute changes.  Cardiac markers were negative.  He was  admitted for further evaluation.   HOSPITAL COURSE:  The patient ruled out for MI.  He has continued to  have substernal chest pressure, which is reproducible with palpation and  deep breathing.  He has had some relief with oral Valium as well as IV  Ativan.  He feels his pain most likely musculoskeletal and do not plan  to pursue additional cardiac evaluation at this time.  [name] will  be discharged home today in good condition.   DISCHARGE LABORATORIES:  Hemoglobin 15.0, hematocrit 44.0, WBC 10.1, and  platelets 243.  Sodium 143, potassium 3.8, chloride 110, glucose 112,  BUN 13, and creatinine 1.5.  CK 129, MB 2.8, and troponin I 0.03.  Urinalysis negative.   DISPOSITION:  The patient will be discharged to home today in good  condition.   FOLLOWUP PLANS AND APPOINTMENTS:  We have arranged follow up with Dr.  Riley Kill in our [facility] office on October 27, at 8:45 a.m.  He is  asked to follow up with Dr. Junious Dresser, his new primary care  [name].  The patient may benefit from referral to Pain Clinic.   DISCHARGE MEDICATIONS:  1. Chantix as directed.  2. Aspirin 325 mg daily.  3. Benicar HCT 20/12.5 mg daily.  4. Cetirizine 10 mg daily.  5. Cyclobenzaprine 10 mg nightly p.r.n.  6. Diazepam 10 mg t.i.d.  7. Flonase 50 mcg 1-2 sprays each nostril nightly.  8. Lipitor 80 mg nightly.  9. Lopressor 50 mg b.i.d.  10.Nitroglycerin 0.4 mg sublingual p.r.n. chest pain.  11.Omeprazole 20 mg daily.  12.Oxycodone and acetaminophen 7.5/325 q.4-6 h. p.r.n.  13.Potassium chloride 20 mEq b.i.d.  14.Tramadol 50 mg t.i.d.  15.Ventolin inhaler 90 mcg 2 puffs q.a.m.  16.Zetia 10 mg daily.   OUTSTANDING LABORATORIES AND STUDIES:  None.   DURATION OF DISCHARGE/ENCOUNTER:  50 minutes including physician  time.      Nicolasa Ducking, ANP      Verne Carrow, MD  Electronically Signed    CB/MEDQ  D:  05/29/2009  T:  05/29/2009  Job:  [id number]   cc:   Junious Dresser, MD

## 2011-02-23 NOTE — Assessment & Plan Note (Signed)
[facility]                                 ON-CALL NOTE   NAME:Weiher, NAHOM CARFAGNO                    MRN:          [id number]  DATE:12/12/2007                            DOB:          [birth year]    HISTORY:  I received a call from Mr. Drapeau earlier this evening  stating that he was recently discharged secondary to multiple ICD fires  and revision.  After the time of discharge he says he was given a  prescription for Percocet with 60 tablets and zero refills.  He also  notes that he is chronically on Percocet.  When he went to the pharmacy  today to have the prescription filled they noted that his insurance  apparently refused the prescription because it was too early for them to  refill and that a dose was missing on the prescription and therefore our  office was contacted.  The pharmacy, I found out by discussing this with  them, called our office and apparently spoke with Dr. Rosalyn Charters nurse  who after consultation with one of the office physicians determined that  the patient would receive only 10 days of Percocet.  Mr. Huseby  currently has enough Percocet to last him at least 2 if not 3 days.  He  does have followup with Dr. Riley Kill tomorrow.  I recommended that he  have a discussion with Dr. Riley Kill with regards to the number of  Percocet which he should require.  I advised that typically people do  not get Percocet on discharge following ICD placement and that he could  try Tylenol for incisional pain.  He then responds saying that he is on  it chronically for other pain but for some reason is reluctant to  request more Percocet from his primary care Shila Kruczek.  Also of note is  that [facility] reports the previous Percocet prescription was not  filled with them.  The patient will follow up with Dr. Riley Kill tomorrow.      Nicolasa Ducking, ANP  Electronically Signed      Arturo Morton. Riley Kill, MD, [facility]  Electronically Signed   CB/MedQ   DD: 12/12/2007  DT: 12/13/2007  Job #: [id number]

## 2011-02-23 NOTE — Discharge Summary (Signed)
[name], [name]             ACCOUNT NO.:  [id number]   MEDICAL RECORD NO.:  [id number]          PATIENT TYPE:  INP   LOCATION:  4735                         FACILITY[facility]  [facility]   PHYSICIAN:  Arturo Morton. Riley Kill, MD, FACCDATE OF BIRTH:  [birth year]   DATE OF ADMISSION:  10/21/2008  DATE OF DISCHARGE:  10/23/2008                               DISCHARGE SUMMARY   PRIMARY FINAL DISCHARGE DIAGNOSIS:  Chest pain.   SECONDARY DIAGNOSES:  1. Status post aortocoronary bypass surgery in 1996 with internal      mammary artery graft to left anterior descending coronary artery,      saphenous vein graft to diagonal, saphenous vein graft to obtuse      marginal, radial artery to posterior descending artery.  2. Status post cardiac catheterization in November 2009 showing      saphenous vein graft to obtuse marginal totaled which was old,      other grafts patent, and an ejection fraction of 60%.  3. History of ventricular tachycardia, status post Medtronic      implantable cardioverter-defibrillator.  4. History of myocardial infarction in 1986 and 2006.  5. Ongoing tobacco use.  6. Remote history of cocaine, ethyl alcohol, and tetrahydrocannabinol      use.  7. Hypertension.  8. Hyperlipidemia.  9. Family history of coronary artery disease.  10.Degenerative joint disease.  11.Reported history of bipolar disorder.  12.Remote history of syncope and gastrointestinal bleed.  13.Allergy or intolerance to CONTRAST, PROCARDIA, and CODEINE.   TIME AT DISCHARGE:  41 minutes.   HOSPITAL COURSE:  [name] is a 60 year old male with known coronary  artery disease and also frequent atypical chest pain.  He had chest pain  on the day of admission that started at about 5:30 a.m. and did not  respond much to nitroglycerin, but did respond to Dilaudid.  He was  admitted for further evaluation.   His cardiac enzymes were negative for MI.  A urine drug screen was  positive only for benzodiazepines.   His chest x-ray did not show any  acute abnormalities and the head CT without contrast has the final  report pending, but the preliminary report shows no acute abnormality.  His EKG was without significant abnormality as well.   [name] symptoms improved with narcotic therapy.  He was continued  on his home medications and monitored.  By October 23, 2008, his  symptoms had greatly improved and then resolved.  His enzymes had  remained negative and his EKG was without changes.  [name] was  evaluated by Dr. Riley Kill and considered stable for discharge with  outpatient followup arranged.   DISCHARGE INSTRUCTIONS:  His activity level is to be increased  gradually.  He is to stick to a low-sodium heart-healthy diet.  He is to  follow up with Dr. Riley Kill on November 05, 2008, at 11 a.m. and with Dr.  Audria Nine as well.   DISCHARGE MEDICATIONS:  1. Aspirin 325 mg daily.  2. Lipitor 80 mg a day.  3. Omeprazole 20 mg a day.  4. Metoprolol 50 mg b.i.d.  5. Zetia 10  mg a day.  6. Klor-Con 20 mEq daily.  7. Hydrochlorothiazide 25 mg a day.  8. Percocet 7.5 mg as prior to admission.  9. Valium 10 mg b.i.d. as prior to admission.      Theodore Demark, PA-C      Arturo Morton. Riley Kill, MD, [facility]  Electronically Signed    RB/MEDQ  D:  10/23/2008  T:  10/24/2008  Job:  [id number]   cc:   Maurice March, M.D.  [facility]

## 2011-02-23 NOTE — Consult Note (Signed)
NAMECOLVIN, BLATT NO.:  [id number]   MEDICAL RECORD NO.:  [id number]          PATIENT TYPE:  EMS   LOCATION:  MAJO                         FACILITY:  [facility]   PHYSICIAN:  Hillis Range, MD       DATE OF BIRTH:  1951/06/21   DATE OF CONSULTATION:  03/19/2009  DATE OF DISCHARGE:  03/19/2009                                 CONSULTATION   PRIMARY CARDIOLOGIST:  Maisie Fus D. Riley Kill, MD, [facility]   PRIMARY CARE Johanny Segers:  Administrator on  [address].   [name] is a 60 year old Caucasian gentleman, who presents to [facility] Emergency Room today complaining of multiple issues including back  pain, pain around his ICD site, muscle spasms in his left chest and left  arm.  The patient states he ran out of his Percocet 3 weeks ago, and he  thinks the pain around his ICD site is coming from being out of his  Percocet.  He reports lifting hay last week.  He has horses.  He also  complains of some degenerative disk disease.  He denies any shocks from  his ICD.  He denies any chest pain, heaviness or tightness, shortness of  breath, or palpitations.  He states he noticed the pain has gotten worse  since he ran out of his Percocet.  He does have tenderness to palpation  in the left rib cage anterior and posteriorly.  EKG here in the ER shows  no acute findings and a first set of cardiac markers are negative.   PAST MEDICAL HISTORY:  1. Coronary disease status post bypass in 1996.  2. Bipolar disorder.  3. History of polysubstance abuse including tobacco, cocaine, alcohol,      and marijuana.  4. Hypertension.  5. History of V-tach status post Medtronic ICD implantation in      February 2009.  6. Remote history of GI bleed.   SOCIAL HISTORY:  The patient lives here in [place] with his wife.  He  is disabled.  He continues to smoke a pack of cigarettes a day, denies  any EtOH or illicit substance use.  Exercise, he states he does some  work around his house with  his horses but no routine exercise.   FAMILY HISTORY:  Positive for CAD in his mother and CAD in his father,  both deceased.  Positive for CAD in siblings.   REVIEW OF SYSTEMS:  Positive for chest pain in his left rib cage,  positive for musculoskeletal discomfort in his left shoulder, and  positive for musculoskeletal pain in his back.  All other systems  reviewed and negative.   ALLERGIES:  1. IVP DYE.  2. PROCARDIA.  3. CODEINE.   CURRENT MEDICATIONS:  1. Potassium 20 mEq daily.  2. Lisinopril, the patient is unclear of dose.  3. Lopressor 50 mg b.i.d.  4. Valium 10 mg b.i.d.  5. Prilosec 20 mg daily.  6. Lipitor 80 daily.  7. Aspirin 325 daily.  8. Nitroglycerin p.r.n.  9. Hydrochlorothiazide 25 daily.  10.Percocet 7.5/325 q.i.d. p.r.n.   PHYSICAL EXAMINATION:  VITAL SIGNS:  Temperature  98.2, heart rate 62,  respirations 20, blood pressure 152/96, sat 96% on room air.  GENERAL:  In no acute distress.  HEENT:  Unremarkable.  Poor dentition.  NECK:  Supple without lymphadenopathy, bruits, or JVD.  CARDIOVASCULAR:  S1 and S2, regular rate and rhythm.  CHEST:  The patient has a well-healed ICD site to the left upper chest  without signs of hematoma, redness, or warmth.  LUNGS:  Scattered rhonchi otherwise clear to auscultation.  SKIN:  Warm and dry.  ABDOMEN:  Soft, nontender, positive bowel sounds.  LOWER EXTREMITIES:  Without clubbing, cyanosis, or edema.  MUSCULOSKELETAL:  The patient has tenderness to palpation in the left  rib cage and axillary area.  NEUROLOGIC:  Alert and oriented x3.   Chest x-ray showed no acute findings.  EKG sinus rhythm with a right bundle-branch block at a rate of 59,  unchanged from previous.  Point-of-care negative x1.  Hematocrit 42.2, WBC 6.9.  Potassium 4.1,  creatinine 1.1.   Dr. Hillis Range has been into examine and assessed the patient, who  presents with marked tenderness in the axillary and left rib cage area.  The patient's  pain is felt to be musculoskeletal.  Further workup per ER  physician.  The patient is to follow up outpatient with Dr. Riley Kill as  previously scheduled.  No further cardiac workup at this time.      [name], ACNP      Hillis Range, MD  Electronically Signed    MB/MEDQ  D:  03/20/2009  T:  03/20/2009  Job:  [id number]

## 2011-02-23 NOTE — Op Note (Signed)
[name], [name]             ACCOUNT NO.:  [id number]   MEDICAL RECORD NO.:  [id number]          PATIENT TYPE:  INP   LOCATION:  4733                         FACILITY:  [facility]   PHYSICIAN:  Doylene Canning. Ladona Ridgel, MD    DATE OF BIRTH:  1951-05-26   DATE OF PROCEDURE:  12/08/2007  DATE OF DISCHARGE:  12/10/2007                               OPERATIVE REPORT   PROCEDURE PERFORMED:  Implantable cardioverter-defibrillator lead  revision.   HISTORY:  The patient is a 60 year old male with a history of syncope,  ischemic cardiomyopathies, inducible VTs status post ICD insertion.  He  has subsequently received multiple ICD shocks and was found to have  microscopic lead dislodgment and is now referred for ICD lead revision.   PROCEDURE:  After informed consent was obtained, the patient was taken  to diagnostic EP lab in the fasting state.  After usual preparation and  draping, intravenous fentanyl and midazolam was given for sedation.  A  30 mL of lidocaine was infiltrated over the old ICD insertion site.  A 7-  cm incision was carried out over this region.  Electrocautery was  utilized to dissect down to the fascial plane.  The ICD lead was freed  up from the silk suture.  A stylet was advanced into the lead after it  was disconnected from the generator and the stylet was advanced to the  tip of the lead which was sitting in the right ventricle.  The helix of  the defibrillator lead was retracted back into the housing and mapping  was again carried out at the final site on the RV septum.  The R-waves  were 10; the impedance with lead actively fixed was 763 ohms and a  threshold 0.5 volts of 0.5 milliseconds.  With these satisfactory  parameters, lead was resecured to the  subpectoralis fascia  with a  figure of eight silk suture and the sewing sleeve resecured with silk  suture.  The device was reconnected back to the Medtronic defibrillator  and placed back into the subcutaneous pocket.   Defibrillation threshold testing was carried out and VF was induced with  a T-wave shock and a 15 joule shock was delivered, which terminated VF  and restored sinus rhythm.  At this point, no distal defibrillator  threshold testing was carried out and the incision was closed with 2-0  Vicryl followed by 3-0 Vicryl after the pocket was irrigated with  kanamycin.   COMPLICATIONS:  There were no immediate complications.   RESULTS:  This demonstrates successful ICD lead revision.  The patient  was prior implanted ICD, who had inappropriate ICD discharge.      Doylene Canning. Ladona Ridgel, MD  Electronically Signed     GWT/MEDQ  D:  02/08/2008  T:  02/09/2008  Job:  [id number]

## 2011-02-23 NOTE — Discharge Summary (Signed)
[name], [name]             ACCOUNT NO.:  [id number]   MEDICAL RECORD NO.:  [id number]          PATIENT TYPE:  INP   LOCATION:  3715                         FACILITY:  [facility]   PHYSICIAN:  Doylene Canning. Ladona Ridgel, MD    DATE OF BIRTH:  Feb 23, 1951   DATE OF ADMISSION:  10/18/2007  DATE OF DISCHARGE:  10/21/2007                               DISCHARGE SUMMARY   PROCEDURES:  1. Cardiac catheterization.  2. Coronary arteriogram.  3. Left ventriculogram.  4. Abdominal aortogram.  5. CT of the head without contrast.  6. MRI and MRA of the brain.   TIME AT DISCHARGE:  Fifty-one minutes.   PRIMARY FINAL DISCHARGE DIAGNOSIS:  Syncope, no critical arrhythmia and  no critical abnormality on CT or MRI, electrophysiology study pending as  outpatient.   SECONDARY DIAGNOSES:  1. Chest pain, status post cardiac catheterization this admission      showing no critical distal disease and an ejection fraction of 50%,      medical therapy recommended.  2. Status post aortocoronary bypass surgery in [place], [place] in 2006 with left internal mammary artery to left anterior      descending, saphenous vein graft to diagonal, saphenous vein graft      to right coronary artery, saphenous vein graft to circumflex      (saphenous vein graft to circumflex totaled at catheterization in      2006).  3. Hypertension.  4. Hyperlipidemia.  5. History of tobacco use, quit recently.  6. Family history of premature coronary artery disease.  7. Remote history of cocaine use and urine drug screen testing      positive for tetrahydrocannabinol this admission.  8. Degenerative joint disease.  9. Allergy or intolerance to codeine and Procardia.  10.History of bipolar disorder.  11.History of gastrointestinal bleed.  12.Allergy or intolerance to intravenous contrast.   HOSPITAL COURSE:  [name] is a 60 year old male with known coronary  artery disease.  Per the patient, he had 2 syncopal  episodes and came to  the emergency room, where he was admitted for further evaluation and  treatment.   His cardiac enzymes were negative for MI.  He had orthostatic vital  signs taken which were within normal limits.  Head CT was without  significant abnormality.  It was felt that since he also had chest pain,  a cardiac catheterization was indicated and this was performed on  October 20, 2007.   The cardiac catheterization showed patent LIMA to LAD, SVG to diagonal  and SVG to PDA.  There was no significant progression in the circumflex  disease at 30% and no critical distal disease.  His EF was 50% with  inferior akinesis.  The SVG to circumflex was totaled, but this is old.  Dr. Juanda Chance evaluated the films and found no source of ischemia and felt  that the etiology of the syncope was not clear.  Telemetry strips were  reviewed, but showed no significant arrhythmia.  Her neurology consult  was called.   [name] was seen by Dr. Orlin Hilding and it was  felt that he had no clear  signs or symptoms of seizure or VVI.  An MRA and MRI of the brain were  recommended.   The MRI and MRA showed probable diffuse mild atherosclerosis, but no  large vessel occlusion and the MRA of the neck was negative.  The MRI of  the brain was negative as well.   With a negative MRI of the brain, Dr. Vickey Huger felt that no further  inpatient workup was indicated.  He had no further episodes of syncope.  Dr. Ladona Ridgel recommended keeping him in the hospital, but the patient was  resistant to this and Dr. Ladona Ridgel felt that he could be safely discharged  and return on Monday for an EP study, which if positive will result in  him getting an ICD and if negative, he will get a loop recorder.   LABORATORY VALUES:  Urine drug screen on admission was negative except  for benzodiazepines.  Urine drug screen prior to discharge was positive  for benzodiazepines and tetrahydrocannabinol.  Total cholesterol 102,   triglycerides 50, HDL 27, LDL 65.  Sodium 138, potassium 3.6, chloride  108, CO2 27, BUN 11, creatinine of 0.04, glucose 128.  Hemoglobin 12.8,  hematocrit 37.6, WBC 15.0, platelets 175.  Urinalysis was negative.   Chest x-ray showed COPD, but no acute disease.   DISCHARGE INSTRUCTIONS:  1. His activity level is to be increased gradually.  2. He is to follow up Cardiology on Monday morning and return to short      stay section C at 10 a.m. with no food or drink after midnight, but      meds okay.  3. He is to follow up with Dr. Audria Nine as scheduled.   DISCHARGE MEDICATIONS:  1. Metoprolol, nitroglycerin sublingual and hydrochlorothiazide are on      hold.  2. Valium 10 mg as prior to admission.  3. Omeprazole daily.  4. Aspirin 325 mg daily.  5. Lipitor 80 mg daily.  6. Percocet as prior to admission.  7. Ambien 5 mg as needed nightly, 5-mg tablets, #10, no refills.      Theodore Demark, PA-C      Doylene Canning. Ladona Ridgel, MD  Electronically Signed    RB/MEDQ  D:  10/21/2007  T:  10/22/2007  Job:  [id number]   cc:   Maurice March, M.D.

## 2011-02-23 NOTE — Discharge Summary (Signed)
[name], [name]             ACCOUNT NO.:  [id number]   MEDICAL RECORD NO.:  [id number]          PATIENT TYPE:  INP   LOCATION:  3729                         FACILITY:  [facility]   PHYSICIAN:  Arturo Morton. Riley Kill, MD, FACCDATE OF BIRTH:  [birth year]   DATE OF ADMISSION:  03/25/2008  DATE OF DISCHARGE:  03/27/2008                               DISCHARGE SUMMARY   PROCEDURES:  1. Orthostatic vital signs.  2. Carotid Dopplers.  3. ICD interrogation.   PRIMARY FINAL DISCHARGE DIAGNOSIS:  Chest pain.   SECONDARY DIAGNOSES:  1. Dizziness.  2. Status post aortocoronary bypass surgery in 2006 with left internal      mammary artery to left anterior descending, saphenous vein graft to      diagonal 1, saphenous vein graft to circumflex, radial to right      coronary artery.  3. History of myocardial infarctions in 1986 and 2006.  4. Hypertension.  5. Hyperlipidemia.  6. Ongoing tobacco use.  7. Family history of coronary artery disease.  8. Remote history of syncope, possibly secondary to ventricular      tachycardia.  9. History of ventricular tachycardia status post Medtronic      implantable cardioverter defibrillator.  10.Status post cardiac catheterization in January 2009 showing patent      grafts except for the saphenous vein graft to circumflex which was      previously known to be totaled, nonobstructive disease in the      circumflex, no critical distal disease, and an ejection fraction of      50%.  11.Degenerative point joint disease.  12.Reported history of bipolar disorder.  13.Possible history of restless leg syndrome.  14.History of right bundle branch block.  15.Remote history of gastrointestinal bleed.  16.Remote history of cocaine and ethanol abuse.  17.Allergy or intolerance to contrast, Procardia, and codeine.   TIME AT DISCHARGE:  45 minutes.   HOSPITAL COURSE:  [name] is a 60 year old male patient with a  history of coronary artery disease.  He  developed left-sided chest pain  that radiated up into his neck and down his left arm on the day of  admission.  When it did not resolve, he came to the emergency room and  was admitted for further evaluation.   His cardiac enzymes were negative for MI.  His ICD was interrogated and  there were no episodes.  A smoking cessation consult was called and  cessation was encouraged.  He complained of dizziness, so orthostatic  vital signs were performed which were negative.  Carotid Dopplers were  also completed which showed no significant ICA stenosis and antegrade  for tubal artery flow.  Of note, [name] requested to change from  Percocet to Dilaudid on his pain control medications and this was  refused.   By March 27, 2008, [name] stated that his chest pain had resolved  and he was no longer having episodes of dizziness.  Dr.  Riley Kill felt  that outpatient neurology evaluation was appropriate and a message has  been left with that office.  [name] is tentatively considered  stable for  discharge pending evaluation by Dr. Riley Kill on March 27, 2008.   DISCHARGE INSTRUCTIONS:  1. His activity level is to be increased gradually.  2. He is to stick to a low-fat heart-healthy diet.  3. He is to follow up with Dr. Riley Kill on July 23 at 4:15.  4. He is to follow up with neurology and they will call him.  5. He is to follow up with [facility] as needed.   DISCHARGE MEDICATIONS:  1. Lopressor 50 mg b.i.d.  2. Valium 10 mg q.8 h.  3. Vasotec 2.5 mg daily.  4. Zetia 10 mg a day.  5. Hydrochlorothiazide 25 mg daily.  6. Lopressor as prior to admission, 50 mg b.i.d.  7. Lipitor 80 mg daily.  8. Omeprazole 20 mg daily.  9. Aspirin 325 mg daily.  10.Percocet 5/325 p.r.n.  11.Potassium 20 mEq a day.      [name] Demark, [name]      Arturo Morton. Riley Kill, MD, [facility]  Electronically Signed    RB/MEDQ  D:  03/27/2008  T:  03/28/2008  Job:  [id number]   cc:   Vara Guardian  Neurology

## 2011-02-23 NOTE — Discharge Summary (Signed)
NAMEBRANDN, MCGATH             ACCOUNT NO.:  [id number]   MEDICAL RECORD NO.:  [id number]          PATIENT TYPE:  OIB   LOCATION:  4706                         FACILITY:  [facility]   PHYSICIAN:  Maple Mirza, PA   DATE OF BIRTH:  Feb 14, 1951   DATE OF ADMISSION:  10/23/2007  DATE OF DISCHARGE:  10/24/2007                               DISCHARGE SUMMARY   ALLERGIES:  PROCARDIA, CODEINE and he has an IV DYE ALLERGY.   Time for this dictation and exam:  Greater than 35 minutes.   FINAL DIAGNOSIS:  1. Discharging day one:  Status post electrophysiology study with      finding of inducible, ventricular tachycardia/ventricular      fibrillation.  2. Discharging day one:  Status post implant Medtronic Maximo single      chamber cardioverter-defibrillator.  Defibrillator threshold study      less than or equal to 15 joules.   SECONDARY DIAGNOSES:  1. Admitted the week of January 5 with syncope x2.  No prodrome and      until electrophysiology study, unclear etiology.  2. History of myocardial infarctions, 1986 and 2006.  3. Three-vessel coronary artery disease, status post coronary artery      bypass graft surgery, [place], [place].  Left heart      catheterization October 20, 2007,  ejection fraction 50%.  Distal      aortogram showed patent renal arteries and no significant      aortoiliac obstruction.  The radiograph to the PDA is patent.  The      left internal mammary artery to the left anterior descending  is      patent.  Saphenous vein graft to the diagonal of the left anterior      descending is patent.  The vein graft to the circumflex system was      occluded.  This anatomy has not changed since catheterization 2007.  4. Electrocardiogram shows inferior myocardial infarction, right      bundle branch block.  5. Hypertension.  6. Degenerative joint disease.  7. History of polysubstance abuse.   At discharge will write prescriptions for Valium and Percocet  covering  only 1 week.  He will then follow-up with Dr. Audria Nine to renew  prescriptions.   PROCEDURE:  1. October 23, 2007, electrophysiology study with finding of inducible      ventricular tachycardia/ventricular fibrillation.  2. Implant Medtronic Maximo  single chamber cardioverter      defibrillator.  Defibrillator threshold study less than or equal to      15 joules, Dr. Lewayne Bunting.   BRIEF HISTORY:  Mr. Mercer is a 60 year old male.  He has known  coronary artery disease.  He had myocardial infarctions (inferior) in  1986 and 2006.   His ejection fraction has varied between 35-50% in the past.  He  underwent catheterization on October 20, 2007 which showed ejection  fraction 50% and no significant change from a catheterization 2007.   The patient was actually admitted through the emergency room when he  presented with a second syncopal episode.  They occurred both of  them  suddenly without warning.  He was only unconscious for about 20 seconds.  The second one was witnessed by his daughter.  He denies worsening heart  failure symptoms.  No peripheral edema.  He has no history of  palpitation.   He has a history of polysubstance abuse and has recently quit smoking  and he says he has not been taking drugs recently.  He has a history of  bipolar disorder, now controlled well.  He also has a history of GI  bleed in the past.  The patient said he stop smoking about 1 month ago.   HOSPITAL COURSE:  The patient admitted the week of January 9 after  experiencing two episodes of syncope which scared him.  They came on  without warning and at the time of his admission, they were of unclear  etiology.  That last week he was seen in consultation  by Dr. Lewayne Bunting who recommended that the patient have electrophysiology study and  possible cardioverter-defibrillator implantation if this study were  positive.  The patient asked for discharge and went home Friday,  January 9 to  return Monday, January 12.  He was seen in the short-stay  center January 12 and ready for the study in electrophysiology lab.  Study was positive for inducible VT/VF.  The patient then underwent  sequential implantation of a Medtronic Maximo single-chamber  cardioverter defibrillator.  The patient did have a mild hematoma in the  postoperative period.  This has greatly resolved with the use of weight  and ice in the overnight.  The patient's device has been interrogated.  All parameters were within normal limits.  All parameters were within  normal limits.  Chest x-ray has been examined and shows that the leads  are appropriate.  The patient does not have a pneumothorax.  Incision  care and mobility of the left arm after this been discussed with the  patient.  Once again, the patient will be of given one week bridging of  his begin 1 week bridging of his medications until he sees Dr.  Audria Nine.   DISCHARGE MEDICATIONS:  1. Enteric-coated aspirin 325 mg daily.  2. Valium 10 mg three times daily as needed.  3. Percocet 5/325 1 to 2 tablets every 6 hours as needed.  4. Omeprazole 20 mg daily.  5. Lipitor 80 mg daily at bedtime.  6. Metoprolol 50 mg twice daily.  7. Hydrochlorothiazide 25 mg daily.  8. Nitroglycerin 0.4 mg tabs 1 tablet under the tongue every 5 minutes      x3 doses for chest pain.   He is asked once again to keep his incision dry for next 7 days, to  sponge bathe until Monday, January 19.  He is asked not to drive until  he sees Dr. Ladona Ridgel in the office.   FOLLOW UP:  His followup appointments are at [facility].  1. Dr. Riley Kill, Monday, January 26 at 3:45.  2. ICD clinic, Wednesday, January 28 at 9:20  3. See Dr. Ladona Ridgel, Thursday, April 23 at 9:45.   Of note, perhaps the decision for the patient to drive will be made when  he sees Dr. Riley Kill. If not, then with Dr. Ladona Ridgel.   LABORATORY STUDIES:  For Mr. Douthit were taken the week of January 5.  On the  day of discharge, Saturday, January 10,  his hemoglobin was 12.8,  hematocrit 37.6, white cells 15, platelets of 175.  Serum electrolytes  his past admission,  week of January 5, sodium 138, potassium 3.6,  chloride 108, carbonate 27, BUN is 11, creatinine 1.04,  glucose 128, protime 13.2, INR 1, alkaline phosphatase 69, SGOT is 14,  SGPT is 12, troponin-I study was 0.02.  Drugs of abuse screen positive  for benzodiazepine,  positive for tetrahydrocannabinol negative for  amphetamines, barbituates.  BNP on admission for this patient January 8  was 224.      Maple Mirza, PA     GM/MEDQ  D:  10/24/2007  T:  10/24/2007  Job:  [id number]   cc:   Arturo Morton. Riley Kill, MD, [facility]  Doylene Canning. Ladona Ridgel, MD  Maurice March, M.D.

## 2011-02-23 NOTE — Discharge Summary (Signed)
NAMERAYNER, ERMAN             ACCOUNT NO.:  [id number]   MEDICAL RECORD NO.:  [id number]          PATIENT TYPE:  INP   LOCATION:  3715                         FACILITY:  [facility]   PHYSICIAN:  Doylene Canning. Ladona Ridgel, MD    DATE OF BIRTH:  1951-03-19   DATE OF ADMISSION:  10/18/2007  DATE OF DISCHARGE:  10/21/2007                               DISCHARGE SUMMARY   ADDENDUM:  Upon further review, Mr. Ashraf stated that he normally took  his last Valium 10 mg of the day at approximately 10:00 p.m.  He had  initially requested Ambien q.h.s., but I advised him that it would  probably not be safe to take 10 mg of Valium and 5 mg of Ambien as well  as Percocet at the same time.  I expressed concerns about oversedation  and requested that he either take the Ambien or the valium but not both.  The patient stated that because of restless leg syndrome and other  issues, he would prefer to take the Valium and was not given a  prescription for the Ambien.      Theodore Demark, PA-C      Doylene Canning. Ladona Ridgel, MD  Electronically Signed    RB/MEDQ  D:  10/21/2007  T:  10/22/2007  Job:  [id number]

## 2011-02-23 NOTE — Procedures (Signed)
NAME:  [name], [name]             ACCOUNT NO.:  [id number]   MEDICAL RECORD NO.:  [id number]          PATIENT TYPE:  OUT   LOCATION:  SLEEP CENTER                 [facility]AC[facility]   PHYSICIAN:  Coralyn Helling, MD        DATE OF BIRTH:  1950-11-08   DATE OF STUDY:  02/22/2008                            NOCTURNAL POLYSOMNOGRAM   REFERRING PHYSICIAN:  Coralyn Helling, MD   INDICATION FOR STUDY:  This is an individual who has sleep disruption,  excessive daytime sleepiness, and drop attacks.  He is referred to the  sleep lab for evaluation of his hypersomnia.   Height is 6 feet, weight is 170 pounds, BMI is 23, neck size is 15.   EPWORTH SLEEPINESS SCORE:  18.   MEDICATIONS:  Percocet, Protonix, Valium, aspirin, sublingual  nitroglycerin, Lopressor, enalapril, hydrochlorothiazide, and Lipitor.   SLEEP ARCHITECTURE:  Sleep period of time was 357 minutes.  Total sleep  time was 317 minutes.  Sleep efficiency is 85%.  Sleep latency is 10  minutes.  REM latency is 167 minutes.  The study was notable for the  lack of slow-wave sleep, and the patient slept predominantly in the non-  supine position.   RESPIRATORY DATA:  The average respiratory rate was 17.  The overall  apnea/hypopnea index was 1.9.  The events were exclusively obstructive  in nature.  The REM apnea/hypopnea index was 13.3.  The non-REM  apnea/hypopnea index was 0.6.  The supine apnea/hypopnea index was 0.  The non-supine apnea/hypopnea index was 1.9.  Occasional snoring was  noted by the technician.   OXYGEN DATA:  The baseline oxygenation was 97%.  The oxygen saturation  data was 87%.  The patient spent a total of 2.6 minutes of test time  with an oxygen saturation less than 90%.   CARDIAC DATA:  The average heart rate was 69, and the rhythm showed  sinus rhythm, with PACs and PVCs.   MOVEMENT-PARASOMNIA:  The periodic limb movement index was 46.5, which  is significantly increased.  The patient had one rest room  trip.   IMPRESSIONS-RECOMMENDATIONS:  This study shows evidence for severe  periodic limb movements, with episodes of limb movements throughout the  entire study.  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  However,  given the appearance of this study, the predominant feature was his  periodic limb movements.  What I would recommend is clinical correlation  to determine if further therapy would be beneficial for his limb  movements with  possible restless leg syndrome.  After this, I would reassess him to  determine if any further interventions would be necessary for his REM-  related sleep apnea.      Coralyn Helling, MD  Diplomat, [employer] Board of Sleep Medicine  Electronically Signed     VS/MEDQ  D:  02/26/2008 07:08:27  T:  02/26/2008 07:35:05  Job:  [id number]

## 2011-02-23 NOTE — Assessment & Plan Note (Signed)
[facility]                            CARDIOLOGY OFFICE NOTE   NAME:Grillo, ALCIDE MEMOLI                    MRN:          [id number]  DATE:08/29/2008                            DOB:          1951-02-27    Mr. Garriga is in for a followup visit.  In general, he has been having  recurrent episodes of chest discomfort.  Since I saw him last, he has  been smoking.  His appointment with Dr. Orlin Hilding for followup on his  sleep apnea and his neurologic findings was cancelled.  That has been  rescheduled for later this month.  He did have a sleep study, and he had  REM movement.  We have also recommended that he see Dr. Craige Cotta in follow  up.  He is smoking about a half a pack of cigarettes a day.  He is  working in the fields; however, he says this is the same kind of pain he  was having prior to his bypass.  At catheterization earlier in the year,  he had a patent internal mammary to the LAD.  He also had a patent graft  to the PDA and to the diagonal.  The OM graft was occluded, and there  was some scattered disease in the circumflex system.   His medicines include:  1. Aspirin 325 mg daily.  2. Lipitor 80 mg daily.  3. Protonix 40 mg daily which he is out.  4. He is now taking omeprazole 20 mg daily.  5. Lopressor 50 mg b.i.d.  6. Zetia 10 mg daily.  7. K-Dur 20 mEq daily.  8. Hydrochlorothiazide 25 mg daily.   On physical, he is alert and oriented in no distress.  Blood pressure is  148/98, pulse is 62.  The lung fields are clear.  Cardiac exam is  regular with an S4 gallop.  The device is well healed.   His EKG is virtually unchanged from prior tracings.  This demonstrates  normal sinus rhythm with possible left atrial enlargement, right bundle-  branch block, and inferior MI of indeterminate age.   IMPRESSION:  1. Recurrent chest discomfort described as intermittent, at times      occurring constantly and somewhat atypical.  2. History of recurrent  ventricular tachycardia status post Medtronic      implantable cardioverter-defibrillator.  3. Continued tobacco use after bypass surgery.  4. Coronary artery bypass graft surgery.  5. Degenerative joint disease.  6. History of bipolar disorder.  7. History of restless legs.  8. Probable sleep apnea.  9. History of prior use of cocaine and alcohol.   PLAN:  We had a thorough discussion about his situation today.  He is  convinced this is similar to what he had prior to his bypass surgery.  As a result, we have elected to go ahead and do a cardiac  catheterization.  He is supposed to see Neurology later in the month.  We will also get him an appointment to see Dr. Craige Cotta.  Risks, benefits,  and alternatives have been discussed with the patient, and we will make  arrangements for this  to be done in the next few days.     Arturo Morton. Riley Kill, MD, [facility]  Electronically Signed    TDS/MedQ  DD: 08/29/2008  DT: 08/30/2008  Job #: [id number]

## 2011-02-23 NOTE — H&P (Signed)
[name], [name]             ACCOUNT NO.:  [id number]   MEDICAL RECORD NO.:  [id number]          PATIENT TYPE:  INP   LOCATION:  1824                         FACILITY:  [facility]   PHYSICIAN:  Darryl D. Prime, MD    DATE OF BIRTH:  Mar 04, 1951   DATE OF ADMISSION:  02/04/2009  DATE OF DISCHARGE:                              HISTORY & PHYSICAL   CODE STATUS:  The patient is full code.   CARDIOLOGIST:  The patient's cardiologist is Arturo Morton. Riley Kill, M.D.,  [facility].   PRIMARY CARE PHYSICIAN:  The patient's primary care physician is Maurice March, M.D. at [facility].   CHIEF COMPLAINT:  Chest pain.   HISTORY OF THE PRESENT ILLNESS:  The patient is a 60 year old male with  a history of coronary artery disease.  He was sitting on his couch at  around 3:00 A.M. talking to his daughter when he had a severe episode of  chest pain.  The patient notes in the course of zero to two minutes the  pain was 8/10, was a severe sharp pressure sensation in the center of  the chest going to the back and up to the back of the neck.  The pain  has been relentless.  He took sublingual nitroglycerin x3.  His blood  pressure was in the range of 240/140 he notes; however, the blood  pressure did come down somewhat with the nitro, but the chest pain did  not go away.  He notes associated shortness of breath, but no sweats or  nausea.  The patient also took an aspirin.  He was drove down here by  his daughter and had continued pain of an 8/10 in the ER.  In the ER he  was given fentanyl, aspirin chewable and he was given labetalol 20 mg  IV.  The chest pain was still an 8/10; and, this was 3 hours from the  time of the onset.  The patient denies any drug use.  He denies any  recent cough or wheezing.   PAST MEDICAL AND SURGICAL HISTORY:  The patient's past medical and  surgical histories include:  1. History of aortocoronary bypass surgery in 1996 with an internal      mammary artery to the LAD,  vein graft to the diagonal, vein graft      to the OM and radial artery to the PDA.  2. Status post cardiac cath in November 2009 showing the left main was      okay, LAD 90% stenosed, internal mammary artery to the LAD was      okay, vein graft to the diagonal was okay, circumflex had 40%      stenosis,  saphenous vein to the OM was totally occluded, which is      old, with the circumflex patent, RCA occluded, and the vein graft      or radial artery to the PDA was patent.  He has history of a      preserved EF of 60%.  3. The patient is status post Medtronic ICD placement for ventricular      tachycardia.  4. History of MI in 1986 and 2006.  5. History of ongoing tobacco abuse.  6. History of cocaine abuse and alcohol abuse as well as THC abuse in      the past.  7. History of hypertension.  8. History of hyperlipidemia.  9. History of degenerative joint disease.  10.History of bipolar disorder.  11.History of syncope.  12.History of GI bleeding.   PAST SURGICAL HISTORY:  1. The patient's surgical history is as above.  2. The patient has had a lead revision.   ALLERGIES:  THE PATIENT IS INTOLERANT TO CONTRAST DYE, WHICH CAUSES  CONVULSIONS; AND, ALSO TO PROCARDIA AND CODEINE.   MEDICATIONS:  The medications the patient is on include:  1. Aspirin 325 mg daily.  2. Lipitor 80 mg daily.  3. Omeprazole 20 mg daily.  4. Metoprolol 50 mg twice a day.  5. Zetia 10 mg daily.  6. Klor-Con 20 mEq daily.  7. Hydrochlorothiazide 25 mg daily.  8. Percocet 7.5 mg as needed.  9. Valium 10 mg twice a day.   SOCIAL HISTORY:  The patient lives in [place] alone.  He is disabled.  He has an extensive history of tobacco abuse of greater than 50  pack/years.  He last tested positive for [facility] in January 2009.  He last  tested positive for cocaine in 2007.   FAMILY HISTORY:  Mother died at the age of 17 and father died at the age  of 24; both with a history of heart disease.  There is also  a history  heart disease  in one brother.   REVIEW OF SYSTEMS:  A 14-point review of systems is negative, unless  stated above.   PHYSICAL EXAMINATION:  VITAL SIGNS:  Temperature is 97.4 with a blood  pressure now of 188/104, pulse of 67, respiratory rate of 14, and sat is  100% on 2 liters nasal cannula.  GENERAL APPEARANCE:  In general he is a thin male with multiple tattoos  of the skin.  He is in no acute distress.  HEENT:  Normocephalic and atraumatic head.  Pupils are equal, round and  react to light with extraocular movements intact.  NECK:  The neck reveals JVD in the range of 5-6 cmH2O.  He has a right  carotid bruit.  HEART:  Cardiovascular exam is regular rhythm and rate.  Normal S1 and  S2.  No murmur.  Pulses are 2+ femoral and dorsalis pedis, and  symmetric.  LUNGS:  The lungs are clear to auscultation bilaterally.  ABDOMEN:  The abdomen is soft, nontender and nondistended with no  hepatosplenomegaly.  EXTREMITIES:  The extremities show no clubbing, cyanosis or edema.  MUSCULOSKELETAL:  The musculoskeletal exam shows no joint deformity or  effusion.  The spine has CVA tenderness.  NEUROLOGIC:  On neurological exam he is alert and oriented x4.  Cranial  nerves II-XII are grossly intact.  Strength and sensation are grossly  intact.   LABORATORY DATA:  Chest x-ray showed no acute cardiopulmonary disease.  EKG showed sinus rhythm and a ventricular rate of 66 beats/minute, a  right bundle branch block, PR interval was 190, QRS 150, and QT  corrected to 507, which is mildly prolonged.  His cardiac markers were  negative at 4:57 this morning.  Sodium 138 with a potassium of 3.9,  chloride 103, bicarb 29, BUN 7, creatinine 1.04, and glucose 112.  INR  1.0 and PT 13.5.  White count 9.6, hemoglobin 16.3, hematocrit 44.8 and  platelets 242,000 with segs of 61 and lymphocytes 30.   ASSESSMENT AND PLAN:  1. This is a patient with a history of significant coronary artery       disease who presents with chest pain that is his anginal      equivalent.  What is concerning is that he does have suggestion of      a clinical presentation for aortic dissection as well; and, his      blood pressure was significantly elevated when he came in.  He      apparently has a history of removal or manipulation of the vessels      in the upper left arm and his blood pressures have been notoriously      unequal there; so, we cannot test for possible dissection in that      fashion.  He has an implantable cardioverter-defibrillator so we      cannot do a magnetic resonance angiography.  He apparently has a      DYE allergy and we cannot emergently do a computerized tomographic      scan.  At this time we will premedicate him emergently and obtain a      computerized tomographic scan of the chest to rule out dissection      and pulmonary embolism within the next hour.  We will hold heparin      for now, however, because of the concern for a possible dissection      he will be on aspirin and high-dose beta blockade to control his      blood pressure, and for possible acute coronary syndrome.  We will      check a urine drug screen.  To rule out acute coronary syndrome he      will be in the CCU.  Again, he will be on aspirin and we will place      him on high-dose beta blockade.  If the computerized tomographic      scan is negative we will consider a heparin drip.  2. For his hypertensive urgency we will control with labetalol for now      and/or high-dose metoprolol.  3. For his tobacco abuse we will counsel concerning discontinuation.  4. Deep venous thrombosis and gastrointestinal prophylaxes will be      ordered.      Darryl D. Prime, MD  Electronically Signed     DDP/MEDQ  D:  02/04/2009  T:  02/04/2009  Job:  [id number]

## 2011-02-23 NOTE — Discharge Summary (Signed)
[name], [name]             ACCOUNT NO.:  [id number]   MEDICAL RECORD NO.:  [id number]          PATIENT TYPE:  INP   LOCATION:  4733                         FACILITY:  [facility]   PHYSICIAN:  Learta Codding, MD,FACC DATE OF BIRTH:  Jan 25, 1951   DATE OF ADMISSION:  12/07/2007  DATE OF DISCHARGE:  12/10/2007                               DISCHARGE SUMMARY   DISCHARGE DIAGNOSIS:  Ventricular tachycardia with multiple ICD shocks status post lead  revision this admission.   PAST MEDICAL HISTORY:  1. Coronary artery disease status post CABG.  2. Hypertension.  3. Hyperlipidemia.  4. Ongoing tobacco use.  5. History of cocaine and marijuana use.  6. Remote history of EtOH abuse.  7. Bipolar disorder.  8. Degenerative joint disease, chronic back pain.  9. History of syncope felt to be secondary to ventricular tachycardia      status post Medtronic Maximo ICD implanted by Dr. Lewayne [name].  10.2-D echocardiogram January 2009 showing an EF of 55% with      hypokinesis of the basal inferior posterior wall.   HISTORY OF PRESENT ILLNESS/HOSPITAL COURSE:  [name] is a 60-year-  old Caucasian gentleman with past medical history as stated above,  status post ICD implantation by Dr. Ladona [name] in the past in the setting of  syncopal episode and EP study with induced V-tach.  Patient discharged  home in stable condition.  However, on day of admission while at home,  the patient experienced increased fatigue, dizziness and an achy feeling  for several days.  Thought he was getting the flu.  While walking up  steps in his house, his ICD fired.  In the emergency room, interrogation  of his device showed approximately nine shots secondary ICD dysfunction.  Dr. Ladona [name] evaluated the patient, recommended lead revision.  The  patient underwent RV lead repositioning on February 27.  Post lead  revision interrogation done on December 09, 2007.  The patient tolerated  procedure without complications.   However, experienced significant  discomfort and swelling of the pocket.  Dr. Andee [name] in to see the patient  on day of discharge.  H&H stable at 40 and 40.8.  Patient afebrile.  Chest x-ray done on December 09, 2007, no acute findings.  Patient being  discharged home with Percocet for pain.  Will need to follow up in our  office Tuesday for an incision and evaluation.  Will have office call  the patient to arrange appointment.  The patient understands if he has  any fever or chills or symptoms worsen he needs to return to the  hospital.  If not, will be arranged for follow-up on Tuesday and then  follow up with Dr. Riley [name] and Dr. Ladona [name] as previously prescribed.   DISCHARGE MEDICATIONS:  At time of discharge, I have given the patient  prescription for Percocet one to two every 8 hours as needed.  No  refills.  He is to resume his:  1. Aspirin 325.  2. Lipitor 80.  3. Enalapril 2.5 a.m. and 10 mg b.i.d.  4. HCTZ 25 daily.  5. Metoprolol has been decreased to 25  mg b.i.d.  6. Prilosec 20 mg over-the-counter.   PLAN:  He has been given the post pacemaker ICD instructions with  restrictions.  He knows to call the office if he develops any problems.   DURATION OF DISCHARGE ENCOUNTER:  Less than 30 minutes      [name], ACNP      Learta Codding, MD,FACC  Electronically Signed    MB/MEDQ  D:  12/10/2007  T:  12/11/2007  Job:  [id number]

## 2011-02-23 NOTE — Consult Note (Signed)
[name], [name]             ACCOUNT NO.:  [id number]   MEDICAL RECORD NO.:  [id number]          PATIENT TYPE:  INP   LOCATION:  3715                         FACILITY:  [facility]   PHYSICIAN:  Doylene Canning. Ladona Ridgel, MD    DATE OF BIRTH:  13-Feb-1951   DATE OF CONSULTATION:  10/21/2007  DATE OF DISCHARGE:  10/21/2007                                 CONSULTATION   REASON FOR CONSULTATION:  This consultation was requested by Dr. Charlies Constable.  The reason for consultation is recommendations regarding the  treatment of syncope.   HISTORY OF PRESENT ILLNESS:  The patient is a very pleasant 60 year old  man who has a history of known coronary disease and is status post  myocardial infarction in 46 and again in 2006.  He has known LV  dysfunction with an EF that has varied between 35 and 50% in the past.  Most recently he underwent catheterization demonstrating an EF of 50%.  The patient at catheterization had no tight disease noted.  The patient  states that his syncopal episode occurred suddenly without warning and  he was unconscious for only about 20 seconds.  He denies worsening heart  failure symptoms, peripheral edema or palpitations.   PAST MEDICAL HISTORY:  Is notable for arthritis and DJD.  He has a  history of polysubstance abuse in the past but denies smoking presently.  He has a history of bipolar disorder but is now well controlled.  He has  a history of GI bleeding in the past.   ALLERGIES:  He is a history of allergy to [facility] and CODEINE.   MEDICATIONS:  1. Lopressor 50 mg twice daily.  2. Valium 10 mg twice daily.  3. Omeprazole dose unknown.  4. Lipitor 80 mg daily.  5. Aspirin 325 daily.  6. Nitroglycerin p.r.n.  7. HCTZ 25 mg daily.  8. Percocet as needed.   SOCIAL HISTORY:  The patient has a history of tobacco use but stopped  smoking approximately 1 month ago.  Prior to this he had smoked a pack a  day since he was 14.  He denies alcohol abuse.  He is  disabled.   FAMILY HISTORY:  Is notable for a mother with an MI and a father with an  MI.   REVIEW OF SYSTEMS:  The patient's review of systems is negative except  that he has difficulty with sleep and chronic pain as previously noted.   PHYSICAL EXAM:  GENERAL:  Is notable for him being a pleasant middle-  aged man in no acute distress.  VITAL SIGNS:  Blood pressure was 150/85, the pulse was 80 and regular,  respirations were 18.  HEENT:  Normocephalic and atraumatic.  Pupils equal and round.  Oropharynx moist.  Sclerae anicteric.  NECK:  Revealed no jugular venous distention.  There is no thyromegaly.  The trachea is midline.  The carotids are 2+ and symmetric.  LUNGS:  Clear bilaterally to auscultation.  No wheezes, rales or rhonchi  are present.  There is no increased work of breathing.  CARDIOVASCULAR:  Exam revealed a regular rate and rhythm  with normal S1-  S2.  There are no murmurs, rubs or gallops.  PMI was not enlarged nor  laterally displaced.  ABDOMEN:  Soft, nontender, nondistended.  There is no organomegaly.  EXTREMITIES:  Demonstrated no edema.  NEUROLOGIC:  Alert and oriented x3.  Cranial nerves intact.  Strength  was 5/5 and symmetric.   EKG demonstrates sinus rhythm with inferior myocardial infarction as  well as right bundle branch block.   IMPRESSION:  1. Ischemic heart disease status post myocardial infarction with mild      LV (left ventricle) dysfunction and a clear segmental wall motion      abnormality.  2. Syncope, unclear etiology but worrisome for arrhythmia.  3. Hypertension.  4. Degenerative joint disease.  5. Remote polysubstance abuse, now off all substances including      tobacco for 5 weeks.   DISCUSSION:  I have discussed the treatment options with the patient.  The risks, benefits, goals and expectations of electrophysiologic  testing have been discussed with the patient.  If his EP test is  negative then implantable loop recorder would be  recommended.  If it is  positive for inducible VT then ICD implantation would be warranted.  I  discussed all these issues with the patient.  The patient has a request  that he be allowed to go home and we will have been set up to come back  in the next several days to have this accomplished.      Doylene Canning. Ladona Ridgel, MD  Electronically Signed     GWT/MEDQ  D:  10/21/2007  T:  10/22/2007  Job:  [id number]   cc:   Everardo Beals. Juanda Chance, MD, [facility]  Arturo Morton. Riley Kill, MD, [facility]  Maurice March, M.D.

## 2011-02-23 NOTE — Consult Note (Signed)
[name], [name]             ACCOUNT NO.:  [id number]   MEDICAL RECORD NO.:  [id number]          PATIENT TYPE:  INP   LOCATION:  3715                         FACILITY:  [facility]   PHYSICIAN:  Gustavus Messing. Orlin [name], M.D.DATE OF BIRTH:  January 20, 1951   DATE OF CONSULTATION:  10/20/2007  DATE OF DISCHARGE:                                 CONSULTATION   CHIEF COMPLAINT:  Syncope.   HISTORY OF PRESENT ILLNESS:  [name] is a 60 year old man with  syncope which has been associated in the past with chest pain and MI.  This week however he had 2 episodes which were associated with chest  pain.  However, a repeat cardiac cath did not show any acute disease  although he did have some problems in his native and graft.  He is not  felt to be a surgical candidate and it has not changed since the 2007  event.  He is being considered for a loop monitor.  He has had some  arrhythmias as well with some dizziness associated with it which could  be near syncope.  Both events occurred when he stood up and again were  associated with some chest pain.  However, he also describes some other  symptoms including poor balance and a previous stroke in the past.  We  were asked to see whether or not there is anything associated with  neurological component to this syncope.  He has a history of substance  abuse, hypertension, hyperlipidemia in addition to the coronary artery  disease.  There was no associated incontinence, tongue biting or  convulsive activity clearly associated with this although he had some  brief shaking with the first episode which was witnessed.  Apparently  the second episode was not.  He does describe that for several months or  so he has had progressive balance problems and that he tends to bump  into things and may veer to the right while walking.  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   REVIEW OF SYSTEMS:  Review of systems of a 13 system review he  complains  of some generalized dizziness, syncope, blurred vision, the veering to  the right, chest pain which is worsened when he has the syncope, some  nausea and the disequilibrium.   PAST MEDICAL HISTORY:  Is significant for the coronary artery disease  with four vessel CABG in 2006 and stents later, recent cath in 2007 and  cath today which showed stable disease, chronic pain with degenerative  joint disease in back, hypertension which is severe and uncontrolled,  polysubstance abuse with cocaine and marijuana, history of GI bleed,  history of bipolar disorder.   HOME MEDICATIONS:  1. Include Lopressor.  2. Valium.  3. Omeprazole.  4. Lipitor.  5. Aspirin.  6. Nitroglycerin.  7. Hydrochlorothiazide.  8. Percocet.   ALLERGIES:  There is an allergy listed to CONTRAST DYE in the chart,  also listed is CODEINE and NIFEDIPINE.   SOCIAL HISTORY:  He is a smoker although he quit just recently, history  of heavy alcohol and history of marijuana and  cocaine abuse.  Previously  worked as a Advice worker.  He has a daughter and lives with the son as  well.  He has drug related problems and daughter lives next door I  gather.  He is filing for disability.   FAMILY HISTORY:  Is positive for coronary artery disease.   PHYSICAL EXAMINATION:  VITAL SIGNS:  Temperature is 97.4, pulse 73,  respirations 17, BP 149/91, O2 sat 97%.  HEENT:  Head is normocephalic, atraumatic.  NECK:  Without bruits.  NEUROLOGICAL:  He is alert.  He is oriented two spheres to age and  month.  He follows two out of two commands.  EOMs are full.  Visual  fields are full to single stimulation but he may be extinguishing on the  right to double simultaneous stimulation.  There is no facial weakness.  He has no drift in the left upper extremity, has a mild right upper  extremity drift but it looks somewhat fake.  No drift in either lower  extremity.  There is no ataxia on finger-to-nose, heel-to-shin or even   on tandem gait.  Sensory is very patchy with patchy loss in the upper  extremities bilaterally but none in the lower extremities.  There is no  aphasia or dysarthria.  There is a questionable extinction.  His NIH  stroke score is therefore 4.   CT of the brain on admission was fairly unremarkable, was negative,  really did not show any small vessel disease or any previous strokes  although a small brain stem lesion could easily be missed.   IMPRESSION:  Syncope with no clear signs or symptoms of seizure or VBI  during the spells but by history he may have had a previous stroke.  His  exam has functional components to it but may suggest posterior  circulation or posterior MCA infarct either old or new and he could have  vertebrobasilar insufficiency.   RECOMMENDATIONS:  Would get MRI of the brain and MR angiogram of the  head and neck prior to the loop device implantation.  We will try to do  that tonight before he is discharged in the morning.  If it shows an  acute stroke obviously he should not be discharged.      [name], M.D.  Electronically Signed     CAW/MEDQ  D:  10/20/2007  T:  10/21/2007  Job:  [id number]

## 2011-02-23 NOTE — Assessment & Plan Note (Signed)
[facility]                            CARDIOLOGY OFFICE NOTE   NAME:Tesler, NICHOLI GHUMAN                    MRN:          [id number]  DATE:01/22/2008                            DOB:          1951-10-10    Mr. Malachi is in for followup visit.  About 2 weeks ago, and again on  Saturday he had a brief episode of syncope.  He said he was had been  active most of the day and was walking up the stairs and just basically  went out.  The etiology of this remains unclear.  He says he does get  dizzy a fair amount of the time.  He also some remains relatively  fatigued.   CURRENT MEDICATIONS:  1. Aspirin 325 mg daily.  2. Lipitor 80 mg nightly.  3. Protonix 40 mg daily.  4. Hydrochlorothiazide 25 mg daily.  5. K-Dur 20 mEq daily.  6. Enalapril 2.5 mg daily.  7. Metoprolol 25 mg p.o. b.i.d.   PHYSICAL EXAMINATION:  He is alert and oriented in no distress today.  Blood pressure was 142/94 initially and then 120/75 by me supine and  120/75 by me standing, without significant drop after 5 minutes. The  pulse is 72.  The lung fields are clear.  The cardiac rhythm is regular.  EXTREMITIES:  Reveal no edema.   His recent potassium was 3.2.   His EKG reveals normal sinus rhythm with right bundle branch block and  inferior infarct, age indeterminate.  QT interval was 490 milliseconds.   Interrogation of his pacemaker did not reveal evidence of ventricular  arrhythmia or discharge.   Laboratory studies are being done today to exclude hypokalemia.   The patient is scheduled to see Dr. Ladona Ridgel in followup for EP  evaluation.   We are also referring him to Dr. Marcelyn Bruins for sleep evaluation based  upon his transcranial Doppler studies.   The patient is also scheduled to be seen in neurology this week for  further evaluation.   I have absolutely admonished him and told him he should not be driving.  The exact etiology of these events are unclear.  They  do not represent  significant ventricular arrhythmia at least by  telemetry today.  We will await these various opinions.  Again, I have  reinforced that he should not be driving.     Arturo Morton. Riley Kill, MD, [facility]  Electronically Signed    TDS/MedQ  DD: 01/22/2008  DT: 01/22/2008  Job #: [id number]

## 2011-02-23 NOTE — Assessment & Plan Note (Signed)
[facility]                            CARDIOLOGY OFFICE NOTE   NAME:[name], [name]                    MRN:          [id number]  DATE:05/02/2008                            DOB:          05-Apr-1951    HISTORY OF PRESENT ILLNESS:  Mr. Cosma is in for followup.  He is  stable, he is not been having any ongoing chest pain.  He actually feels  really quite good.  He does hurt the armpit every once in a while, but  he stays busy doing a number of things around his yard.  He has not  blacked out.  His defibrillator was interrogated today, and he has had  no shocks.  He has had a couple of VT events, he has also had a couple  of SVT events, but these been previously reprogrammed.  He is also  worried about his back and would like to get this fixed.  Since I last  saw him, he has been seen by Marcelino Freestone.  He has seen Neurology  and follow up with Dr. Orlin Hilding.  His previous transcranial Doppler was  not seen, although been seen by [employer].  The impression of his  nocturnal polysomnogram was evidence for severe periodic limb movements  with episodes of limb movements throughout the entire study.  He also  had REM-related sleep apnea.  Of note, the patient has stopped his  potassium, because he ran out and did not get it refilled.   CURRENT MEDICATIONS:  1. Aspirin 325 mg daily.  2. Lipitor 80 mg at bedtime.  3. Protonix 40 mg daily.  4. Hydrochlorothiazide 25 mg daily.  5. Enalapril 2.5 daily.  6. K-Dur 20 mEq daily.  7. Zetia 10 mg daily.  8. Lopressor 50 mg b.i.d.  9. Omeprazole 20 mg daily.   PHYSICAL EXAMINATION:  Blood pressure is 141/83, the pulse is 81.  The  lung fields are clear and the cardiac rhythm is regular.   CURRENT STUDY:  The electrocardiogram demonstrates sinus rhythm with  right bundle-branch block, inferior MI of indeterminate age, and  nonspecific T-wave changes laterally.   IMPRESSION:  From a cardiac standpoint,  he is currently stable.  His  defibrillator appears to be functioning.  He has had neurologic  evaluation as well as a sleep study.  We will continue to reassess these  findings, and  I will speak with Dr. Craige Cotta about his findings.  I will  also give Dr. Orlin Hilding a call.     Arturo Morton. Riley Kill, MD, [facility]  Electronically Signed    TDS/MedQ  DD: 05/02/2008  DT: 05/03/2008  Job #: [id number]

## 2011-02-23 NOTE — H&P (Signed)
NAMETAHER, [name]             ACCOUNT NO.:  [id number]   MEDICAL RECORD NO.:  [id number]          PATIENT TYPE:  INP   LOCATION:  4735                         FACILITY:  [facility]   PHYSICIAN:  Bevelyn Buckles. Bensimhon, MDDATE OF BIRTH:  [birth year]   DATE OF ADMISSION:  10/21/2008  DATE OF DISCHARGE:                              HISTORY & PHYSICAL   PRIMARY CARE PHYSICIAN:  Maurice March, MD, at [facility].   PRIMARY CARDIOLOGIST:  Arturo Morton. Riley Kill, MD, [facility]   CHIEF COMPLAINT:  Chest pain.   HISTORY OF PRESENT ILLNESS:  [name] is a 60 year old male with a  history of coronary artery disease.  He got up to go to the bathroom  this morning and felt that his balance was off.  He stated he was  swerving.  He was not weak on either side and did not notice any facial  abnormality.  He also had chest pain that began at about 5:30 a.m.  At  its worst, it reached an 8/10.  He used 2 sublingual nitroglycerin (1  while driving) and drove to the emergency room and his symptoms did not  resolve.  In the emergency room, he received 2 more sublingual  nitroglycerin as well as baby aspirin 81 mg x4 and 1 mg of Dilaudid.  He  states that the nitroglycerin did not do very much, but Dilaudid helped  and currently his pain is 7/10.  He says the chest pain was associated  with some shortness of breath and before he took the nitroglycerin he  felt a little presyncopal and felt that the pain was so severe it almost  made him pass out.  He did not have nausea, vomiting, or diaphoresis.  Prior to this morning, his last chest pain episode was 2 days ago.   PAST MEDICAL HISTORY:  1. Status post aortocoronary bypass surgery in 1996 with IMA to LAD,      SVG to diagonal, SVG to OM, and radial artery to PDA.  2. Status post cardiac catheterization in November 2009, showing the      left main okay, LAD 90%, IMA to LAD okay, SVG to diagonal okay,      circumflex 40%, SVG to OM totaled which was old,  RCA totaled, and      the SVG or radial artery to PDA patent.  3. History of preserved left ventricular function with an EF of 60%.  4. Status post [employer] ICD for VT.  5. History of MIs in 1986 and 2006.  6. Ongoing tobacco use.  7. History of cocaine and EtOH as well as THC use.  8. Hypertension.  9. Hyperlipidemia.  10.Family history of coronary artery disease.  11.Degenerative joint disease.  12.Reported history of bipolar disorder.  13.Remote history of syncope.  14.Remote history of GI bleed.   SURGICAL HISTORY:  He is status post cardiac catheterizations as well as  bypass surgery, ICD placement, and lead revision.   ALLERGIES:  He is allergic or intolerant to CONTRAST, PROCARDIA, and  CODEINE.   CURRENT MEDICATIONS:  1. Aspirin 325 mg daily.  2. Lipitor 80  mg a day.  3. Omeprazole 20 mg a day.  4. Metoprolol 50 mg b.i.d.  5. Zetia 10 mg a day.  6. Klor-Con 20 mEq a day.  7. HCTZ 25 mg a day.  8. Percocet 7.5 mg as needed.  9. Valium 10 mg b.i.d.   SOCIAL HISTORY:  He lives in [place] alone.  He is disabled.  He has  an extensive history of tobacco use, greater than 50-pack years, but has  reportedly been quit.  He quit EtOH approximately 20 years ago.  He last  tested positive for [facility] in January 2009, last tested positive for  cocaine in 2007.   FAMILY HISTORY:  His mother died at age 27 and his father died at age  45, both with a history of heart disease.  He also has heart disease in  1 brother.   REVIEW OF SYSTEMS:  He has not had fevers or chills or sweats.  He has  been active around the home, working feeding horses and cleaning out  stalls without significant difficulty even though he does have  occasional chest pain.  The presyncope was today while driving and  resolved quickly.  The shortness of breath has improved.  He has some  chronic arthralgias and anxiety.  His reflux symptoms are fairly well  controlled on the medication with no melena.   Full 14-point review of  systems is, otherwise, negative.   PHYSICAL EXAMINATION:  VITAL SIGNS:  Temperature is 97.3, blood pressure  217/124, pulse 82, respiratory rate 22, and O2 saturation 100% on room  air.  GENERAL:  He is a well-developed, well-nourished white male in no acute  distress despite complaining of 7/10 chest pain.  HEENT:  Normal.  NECK:  There is no lymphadenopathy or thyromegaly noted.  He has got JVD  at 6 cm and a right carotid bruit is noted.  CARDIOVASCULAR:  His heart is regular in rate and rhythm with an S1 and  S2 with no significant murmur, rub, or gallop is noted.  Distal pulses  are intact in all 4 extremities and bilateral femoral bruits are  appreciated.  LUNGS:  He has had a few dry rales.  SKIN:  No rashes or lesions are noted.  ABDOMEN:  Soft and nontender with active bowel sounds.  EXTREMITIES:  There is no cyanosis, clubbing, or edema noted.  MUSCULOSKELETAL:  There is no joint deformity or effusion and no spine  or CVA tenderness.  NEURO:  He is alert and oriented.  Cranial nerves II-XII grossly intact  and no focal deficits noted.   Chest x-ray, no acute disease.   EKG, sinus rhythm, rate 77 with no acute ischemic changes and no  significant changes from an EKG dated September 2009.   LABORATORY VALUES:  Hemoglobin 14.9, hematocrit 45, WBCs 12, and  platelets 317.  Sodium 142, potassium 3.1, chloride 100, BUN 7,  creatinine 1.0, and glucose 88.  Point-of-care markers negative x1.  INR  1.0.  PTT 29.   IMPRESSION:  [name] was seen today by Dr. Gala Romney.  He has had  continuous chest pain since 5:30 this morning without EKG changes and  initial enzymes were negative.  Nitroglycerin is of not much help.  It  is doubtful that the chest pain is cardiac given his last cath in  November 2009, it did not show any critical disease.  We will treat his  blood pressure and check a head CT.  Because of the right carotid  bruit,  carotid Dopplers  can be obtained with an ultrasound as well to workup  his imbalance problem.  If his cardiac enzymes are negative, he can  likely be discharged home in the morning.  We will supplement his  potassium and treat his blood pressure aggressively.      Theodore Demark, PA-C      Bevelyn Buckles. Bensimhon, MD  Electronically Signed    RB/MEDQ  D:  10/21/2008  T:  10/22/2008  Job:  [id number]

## 2011-02-25 ENCOUNTER — Encounter: Payer: Self-pay | Admitting: Internal Medicine

## 2011-02-25 ENCOUNTER — Ambulatory Visit (INDEPENDENT_AMBULATORY_CARE_PROVIDER_SITE_OTHER): Payer: Medicaid Other | Admitting: Internal Medicine

## 2011-02-25 DIAGNOSIS — R55 Syncope and collapse: Secondary | ICD-10-CM

## 2011-02-25 DIAGNOSIS — I428 Other cardiomyopathies: Secondary | ICD-10-CM

## 2011-02-25 DIAGNOSIS — I2510 Atherosclerotic heart disease of native coronary artery without angina pectoris: Secondary | ICD-10-CM

## 2011-02-25 DIAGNOSIS — F17200 Nicotine dependence, unspecified, uncomplicated: Secondary | ICD-10-CM

## 2011-02-25 NOTE — Assessment & Plan Note (Signed)
I discussed the importance of smoking cessation with the patient. He states that he will continue to try and cut down.

## 2011-02-25 NOTE — Progress Notes (Signed)
HPI [name] returns today for followup. He is a pleasant 60 year old man with an ischemic cardiomyopathy, congestive heart failure, severe arthritis, and hypertension. He has recently undergone cervical spine fusion. He denies chest pain or shortness of breath. He has I nerve stimulator in place. He has had no intercedent ICD therapies.  Allergies  Allergen Reactions  . Amitriptyline Hcl   . Codeine   . Iohexol      Desc: PT STATES THAT HE HAS CONVULSIONS/SEIZURES S/P CONTRAST.  PT WAS GIVEN 1 HR PREMEDS AND HAD NO PROBLEMS.  STEPHANIE DAVIS RT-RCT, Onset Date: [id number]   . Nifedipine      Current Outpatient Prescriptions  Medication Sig Dispense Refill  . albuterol (PROVENTIL) (2.5 MG/3ML) 0.083% nebulizer solution Take 2.5 mg by nebulization every 6 (six) hours as needed.        Marland Kitchen aspirin 81 MG tablet Take 81 mg by mouth daily.        . beclomethasone (QVAR) 40 MCG/ACT inhaler Inhale 2 puffs into the lungs 2 (two) times daily.        . cyclobenzaprine (FLEXERIL) 10 MG tablet Take 10 mg by mouth 3 (three) times daily as needed.        . diazepam (VALIUM) 10 MG tablet Take 10 mg by mouth 2 (two) times daily.        Marland Kitchen lisinopril (PRINIVIL,ZESTRIL) 10 MG tablet Take 10 mg by mouth daily.        . metoprolol (LOPRESSOR) 50 MG tablet Take 50 mg by mouth 2 (two) times daily.        . metoprolol (TOPROL-XL) 50 MG 24 hr tablet Take 50 mg by mouth 2 (two) times daily.        . nitroGLYCERIN (NITROSTAT) 0.4 MG SL tablet Place 0.4 mg under the tongue every 5 (five) minutes as needed.        Marland Kitchen omeprazole (PRILOSEC) 20 MG capsule Take 20 mg by mouth daily.        Marland Kitchen oxyCODONE-acetaminophen (PERCOCET) 7.5-325 MG per tablet Take 1 tablet by mouth every 4 (four) hours as needed.        . simvastatin (ZOCOR) 20 MG tablet Take 20 mg by mouth at bedtime.        Marland Kitchen tiotropium (SPIRIVA) 18 MCG inhalation capsule Place 18 mcg into inhaler and inhale daily.        Marland Kitchen DISCONTD: atorvastatin (LIPITOR) 80 MG  tablet Take 80 mg by mouth daily.        Marland Kitchen DISCONTD: ezetimibe (ZETIA) 10 MG tablet Take 10 mg by mouth daily.        Marland Kitchen DISCONTD: hydrochlorothiazide 25 MG tablet Take 25 mg by mouth daily.        Marland Kitchen DISCONTD: potassium chloride SA (K-DUR,KLOR-CON) 20 MEQ tablet Take 20 mEq by mouth daily.           Past Medical History  Diagnosis Date  . Coronary artery disease     bypass grafting. Left internal mammary artery to left anterior descending   . Hypertension   . Myocardial infarction     hx of  . Hypercholesteremia   . Periodic limb movement disorder   . Hypersomnia, unspecified   . Syncope and collapse   . Tobacco use disorder   . Degeneration of lumbar or lumbosacral intervertebral disc   . Personal history of other diseases of digestive system   . Cocaine abuse     hx of   . Alcohol abuse  hx of  . Back pain, chronic   . Bipolar disorder, unspecified   . Aortocoronary bypass status 1996    surgery  . Defibrillator activation     ROS:   All systems reviewed and negative except as noted in the HPI.   Past Surgical History  Procedure Date  . Aortocoronary byp   . Icd pla   . Cardiac defibrillator placement      Family History  Problem Relation Age of Onset  . Heart disease Mother 44    deceased  . Heart disease Father 24    deceased  . Heart disease Brother     alive     History   Social History  . Marital Status: Divorced    Spouse Name: N/A    Number of Children: N/A  . Years of Education: N/A   Occupational History  . Not on file.   Social History Main Topics  . Smoking status: Current Everyday Smoker    Types: Cigarettes  . Smokeless tobacco: Not on file   Comment: hx of tobacco abuse greater than 50 packs/years  . Alcohol Use: Not on file  . Drug Use: No     He last tested positive for THC in 10-2007. Last tested positive for cocaine in 2007  . Sexually Active: Not on file   Other Topics Concern  . Not on file   Social History  Narrative  . No narrative on file     BP 145/72  Pulse 77  Ht 5\' 11"  (1.803 m)  Wt 156 lb (70.761 kg)  BMI 21.76 kg/m2  Physical Exam:  Well appearing NAD HEENT: Unremarkable Neck:  No JVD, no thyromegally. The cervical collar is in place Lymphatics:  No adenopathy Back:  No CVA tenderness Lungs:  Clear. Well-healed ICD incision HEART:  Regular rate rhythm, no murmurs, no rubs, no clicks Abd:  Flat, positive bowel sounds, no organomegally, no rebound, no guarding Ext:  2 plus pulses, no edema, no cyanosis, no clubbing Skin:  No rashes no nodules Neuro:  CN II through XII intact, motor grossly intact  DEVICE  Normal device function.  See PaceArt for details.   Assess/Plan:

## 2011-02-25 NOTE — Assessment & Plan Note (Signed)
He denies any chest pain. We'll continue his current medications.

## 2011-02-25 NOTE — Patient Instructions (Signed)
Your physician wants you to follow-up in: 12 months with Dr. Taylor. You will receive a reminder letter in the mail two months in advance. If you don't receive a letter, please call our office to schedule the follow-up appointment.    

## 2011-02-25 NOTE — Assessment & Plan Note (Signed)
He has had no recurrent syncope since his ICD has been placed

## 2011-02-26 NOTE — H&P (Signed)
NAMEARSENIY, TOOMEY             ACCOUNT NO.:  [id number]   MEDICAL RECORD NO.:  [id number]          PATIENT TYPE:  INP   LOCATION:  3710                         FACILITY:  [facility]   PHYSICIAN:  Bevelyn Buckles. Bensimhon, MDDATE OF BIRTH:  01/30/1951   DATE OF ADMISSION:  06/20/2006  DATE OF DISCHARGE:                                HISTORY & PHYSICAL   CARDIOLOGIST:  Dr. Riley Kill.   REASON FOR ADMISSION:  Chest pain.   HISTORY OF PRESENT ILLNESS:  Mr. Harrower is a 60 year old male, well-known  to me from his recent admission in August.  He has a history of coronary  artery disease and underwent CABG in November 2006, in [place].  He was  admitted to our service in August 2007, with acute inferior myocardial  infarction.  Unfortunately, at the time of admission, he did report recent  large, maroon stool.  Thus, catheterization was delayed.  He was eventually  evaluated by GI and thought safe to go to catheterization.  He underwent  catheterization which showed EF of greater than 50% with an inferior basilar  wall motion abnormality.  LAD had a 90% proximal lesion.  The left  circumflex had 78% lesion in the mid portion and a 80-90% lesion and a small  marginal.  Right coronary artery was totally occluded which was a new  finding from his original catheterization previous to his bypass surgery.  The LIMA to the LAD was widely patent.  The saphenous vein graft to the  diagonal was patent and the saphenous vein graft to the PDA had  approximately a 50% stenosis at the insertion to the PDA.  There was no  acute culprit lesion.  He was treated medically.  He was scheduled to go to  cardiac rehab, but he says he was turned away due to his hypertension.  Since discharge, he says he has had chest pain just about every day that can  last a couple of minutes up to a couple of hours.  He had variable relief  with nitroglycerin.  There is no exertional pain whatsoever.  He does also  have some  associated dyspnea.  He went to see GI for further evaluation, but  was referred back to Dr. Riley Kill for cardiac clearance.  He underwent  perfusion scan on June 10, 2006, which showed an EF of 42% with a fixed  defect inferior and inferolateral wall, but no evidence of ischemia.  EF was  42%.  Today, he was told that his step-father had inoperable cancer and had  3 weeks to live.  Several hours after this, he developed recurrent chest  pain which did not respond to nitroglycerin.  He did have some associated  shortness of breath with this.  He was brought to the ER by his daughter for  further evaluation.  EKG and initial cardiac markers were negative.  He did  have a minimally elevated D-dimer and we are asked to consult.   REVIEW OF SYSTEMS:  Notable for anxiety, chronic back pain, chronic chest  pain, nausea, night sweats and anxiety.  He also has dry eyes.  There is no  melena, bright red blood per rectum, hemoptysis, hematemesis.  No  claudication.  No heart failure symptoms.  Remainder review of systems is  negative except for HPI and problem list.   PAST MEDICAL HISTORY:  1. Coronary artery disease, status post CABG in November 2006, status post      inferior myocardial infarction in August 2007, catheterization as per      HPI.  2. Hypertension.  3  Hyperlipidemia.  1. Tobacco use, ongoing.  2. Anxiety.  3. Chronic back pain.  4. History of maroon stools.  GI evaluation pending.   SOCIAL HISTORY:  He lives in [place] with his daughter.  He is  unemployed.  He is divorced x14 years.  He has four children.  Tobacco with  one pack per day x40 years, now five cigarettes a day.  Does have a history  of marijuana which he has done x2 weeks.  He also has a history of cocaine  use, but says he quit 3 years ago.   FAMILY HISTORY:  Mother died at 54 from myocardial infarction.  Father died  at 31 for myocardial infarction.  Sister died in her 49s due to a GI bleed.    MEDICATIONS:  1. Lipitor 88 a day.  2. Aspirin 325 a day.  3. Percocet 5/325 p.r.n.  4. Valium 10 mg b.i.d.  5. Lopressor 50 mg b.i.d.  6. Metoprolol 40 mg a day.   ALLERGIES:  CODEINE.   PHYSICAL EXAMINATION:  GENERAL:  He is sitting up in bed in no acute  distress.  VITAL SIGNS:  Blood pressure is 170/106, heart rate 61.  He is afebrile and  saturating 98% on room air.  HEENT:  Sclerae anicteric.  EOMI.  There is no xanthelasmas.  Mucous  membranes are moist.  NECK:  Neck is supple.  No JVD.  Carotids are 2+ bilaterally without any  bruits.  There is no lymphadenopathy or thyromegaly.  CARDIAC:  He has a  regular rate and rhythm with a wide split S2, no murmurs, rubs or gallops  appreciated.  LUNGS:  Lungs clear.  He does have some lateral chest wall tenderness.  ABDOMEN:  Soft, nontender, nondistended.  No hepatosplenomegaly.  No bruits.  No masses.  EXTREMITIES:  Warm with no cyanosis, clubbing or edema.  Distal pulses are  1+ bilaterally.  NEUROLOGIC:  He is alert and oriented x3.  Cranial nerves 2-12 are intact.  He moves all four extremities without difficulty.  Affect is appropriate.   LABORATORY DATA AND X-RAY FINDINGS:  EKG shows normal sinus rhythm a right  bundle branch block and previous inferior myocardial infarction.  The rate  is 62.   White count 10.8, hemoglobin 13.8, platelets 224.  Sodium 141, potassium  3.9, chloride 107, bicarb of 28, BUN 12, creatinine of 1.1, glucose 112.  Point of care markers with CK-MB less than 1.0.  Troponin less than 0.05.  D-  dimer 0.74.   ASSESSMENT:  1. Acute on chronic chest pain.  2. Coronary artery disease as described above with recent negative      Myoview.  3. Mild left ventricular dysfunction.  4. Chronic obstructive pulmonary disease with ongoing tobacco use.  5. Hypertension, poorly-controlled.   DISCUSSION/PLAN:  It is not clear to me that Mr. Hickok's chest pain is cardiac in nature.  However, given his known  disease, I do think it is  reasonable to admit him for formal rule out myocardial infarction.  We will  treat him  with heparin, beta blockers, aspirin and nitrates.  I will discuss with Dr.  Riley Kill in the morning regarding the need for a repeat catheterization.  I  do think he will need cardiac rehab as well as to follow up with GI for his  full workup.  We have once again encouraged smoking cessation.      Bevelyn Buckles. Bensimhon, MD  Electronically Signed     DRB/MEDQ  D:  06/20/2006  T:  06/20/2006  Job:  [id number]

## 2011-02-26 NOTE — Assessment & Plan Note (Signed)
[facility]                           GASTROENTEROLOGY OFFICE NOTE   NAME:[name], [name]                    MRN:          [id number]  DATE:06/16/2006                            DOB:          1951/04/28    REFERRING PHYSICIAN:  Arturo Morton. Riley Kill, MD, [facility]   REASON FOR CONSULTATION:  Hemoccult positive stool and reflux disease.   HISTORY:  This is a 60 year old white male with a history of hypertension,  hyperlipidemia, chronic tobacco abuse, and coronary artery disease status  post coronary artery bypass grafting.  He was admitted to [facility]  05/13/2006 with chest pain secondary to an acute myocardial infarction.  GI  was consulted the following day regarding Hemoccult positive stool and  possible recent GI bleed.  I saw the patient at that time who reported  having had a dark stool two days prior to his admission.  No bowel movement  since.  The admission H&P states that the patient was Hemoccult positive per  the emergency room report.  His initial hemoglobin was quite normal at 15.5.  When I saw the patient the following day in consultation, rectal exam  revealed brown Hemoccult negative stool.  In any event, he underwent cardiac  catheterization with Dr. Riley Kill.  He had diffuse disease without an obvious  culprit for the myocardial infarction found.  No obvious clot.  Medical  therapy recommended.  He was discharged home.  Since discharge he has  continued with some chest discomfort and generalized fatigue.  He saw Dr.  Riley Kill in the office 06/07/2006.  I  have reviewed that evaluation.  He is  due to see Dr. Riley Kill this afternoon.  In terms of his GI history, he does  have chronic reflux disease without dysphagia.  He had been on Zantac for  years but was recently started on Protonix after being provided with some  pharmaceutical samples.  He has only been on them a few days.  With his  reflux, he does get some nausea.  He states  his chest discomfort is mild,  somewhat worse with exertion.  He occasionally gets some crampy abdominal  pain.  He denies constipation, diarrhea, melena or hematochezia.  He denies  having undergone prior GI evaluations for endoscopic testing.   PAST MEDICAL HISTORY:  1. Hypertension.  2. Hyperlipidemia.  3. Chronic tobacco abuse.  4. Coronary artery disease status post coronary artery bypass grafting and      recent myocardial infarction.  5. History of drug abuse (cocaine and THC).  6. Chronic back pain.   ALLERGIES:  INTOLERANT TO PROCARDIA.   CURRENT MEDICATIONS:  1. Aspirin 325 mg  daily.  2. Lipitor 80 mg at night.  3. Lopressor 50 mg b.i.d.  4. Protonix 80 mg daily.  5. Percocet b.i.d.  6. Valium 10 mg b.i.d.  7. Nitroglycerin p.r.n.   FAMILY HISTORY:  No family history of gastrointestinal malignancy.   SOCIAL HISTORY:  The patient is  divorced with children.  He lives with his  ex-wife and daughter.  He has a 12th grade education. He is out of work due  to his health problems.  He continues to smoke.  He does not use alcohol.   REVIEW OF SYSTEMS:  Per diagnostic evaluation form.   PHYSICAL EXAMINATION:  GENERAL:  Middle-aged gentleman who looks older than  his stated age. He is alert and oriented in no acute distress.  VITAL SIGNS:  Blood pressure 160/80, heart rate 72 and regular, weight is  172 pounds, he is 5 feet 10 inches in height.  HEENT:  Sclera anicteric, conjunctivae pink.  Oral mucosa intact.  No  adenopathy.  LUNGS:  Clear.  HEART:  Regular.  ABDOMEN:  Soft without tenderness, mass or hernia.  EXTREMITIES:  Without edema.  SKIN:  Multiple tattoos.   IMPRESSION:  1. Questionable gastrointestinal bleed (per patient history only) and      Hemoccult positive stool per EDP.  2. Chronic reflux disease.  3. Significant coronary artery disease with recent myocardial infarction.  4. Multiple other medical problems as discussed.   RECOMMENDATIONS:   Ideally the patient should undergo colonoscopy to evaluate  his Hemoccult positive stool, questionable bleeding and provide colorectal  neoplasia screening.  As well, upper endoscopy to evaluate the same problems  and screen for Barrett's esophagus with chronic reflux disease in a smoker.  I discussed with him today in detail the nature of the procedures as well as  the risks, benefits and alternative strategies.  He was willing to proceed  although he and I both wanted to wait until it was deemed reasonable by Dr.  Riley Kill given his recent cardiac problems.  The patient will see Dr. Riley Kill  this afternoon and I will correspond with Dr. Riley Kill regarding timing of  possible endoscopic evaluations.                                   Wilhemina Bonito. Eda Keys., MD   JNP/MedQ  DD:  06/16/2006  DT:  06/16/2006  Job #:  [id number]   cc:   Arturo Morton. Riley Kill, MD, [facility]

## 2011-02-26 NOTE — Discharge Summary (Signed)
[name], [name]             ACCOUNT NO.:  [id number]   MEDICAL RECORD NO.:  [id number]          PATIENT TYPE:  INP   LOCATION:  4711                         FACILITY:  [facility]   PHYSICIAN:  Gerrit Friends. Dietrich Pates, MD, FACCDATE OF BIRTH:  15-Dec-1950   DATE OF ADMISSION:  05/13/2006  DATE OF DISCHARGE:  05/18/2006                         DISCHARGE SUMMARY - REFERRING   DISCHARGE DIAGNOSES:  1. Inferior myocardial infarction with late presentation.  2. Progressive coronary artery disease with plans for continued      medical treatment.  3. Gastrointestinal bleed with heme stools.  4. Tobacco use.  5. Hyperlipidemia.  6. History as noted below.   PROCEDURE:  Cardiac catheterization on May 14, 2006, by Arturo Morton.  Riley Kill, MD, [facility].   HISTORY:  Mr. Honea is a 60 year old male who was awakened with  substernal chest discomfort at approximately 4 a.m. on the day of  admission.  He took two sublingual nitroglycerin over an hour which  improved his discomfort slightly, however, due to continuing chest  discomfort, his family made him present to the emergency room.  His  discomfort was associated with nausea, shortness of breath, and  diaphoresis.   PAST MEDICAL HISTORY:  Notable for four-vessel bypass surgery in 2006,  hypertension, hyperlipidemia, continued tobacco use, remote cocaine use,  and marijuana use, degenerative joint disease.   LABORATORY DATA:  H&H is 15.5 and 45.1 on admission, normal indices,  platelets 302, WBC's 14.4.  Throughout his admission, CBC has  essentially remained unchanged.  At the time of discharge H&H was 12.5  and 36.1, normal indices, platelets 249, WBC's 10.7.  Stools were heme  positive.  PTT 30, PT 12.7.  Admission sodium 137, potassium 4.2, BUN 8,  creatinine 1.0. AST was slightly elevated at 71.  Initial CK-MB was 846,  145.4, and relative index 17.2, and troponin 2.46.  BNP was 203.  CK-  MB's rose to a second draw at 1292 and 187.2 and  relative index 14.5,  and troponin 14.93.  Subsequent CK-MB's were declining.  Third troponin  was 17.42.  Fasting lipids showed a total cholesterol 152, triglycerides  70, HDL 35, LDL 103.  TSH was 1.37.  Echocardiogram on August 6, showed  overall normal LV function with mild to moderate LVH, right ventricular  dilatation.   EKG showed sinus rhythm, right bundle branch block, T segment elevation  inferiorly.   HOSPITAL COURSE:  Mr. Castile was admitted by Theodore Demark, PA-C and  Farris Has. Dorethea Clan, M.D.  He was felt not to be anticoagulation candidate  secondary to heme positive stools and history of melena.  It is felt  that he should undergo medical treatment and would eventually need a  heart catheterization.  Overnight he did not have any bowel movements  and he still had very mild ongoing chest discomfort.  He had ruled in  for inferior ST-elevated myocardial infarction.  GI consult was obtained  and they felt that he probably had a minor lower GI bleed given his  stable hemoglobin.  They added a proton pump inhibitor b.i.d. and did  not feel he needed endoscopy  at this time.  Cardiac catheterization was  performed on August 4, and this showed native three vessel coronary  artery disease.  LIMA to the LAD was patent, saphenous vein graft to the  diagonal was patent, saphenous vein graft to the RCA was patent, the  saphenous vein graft to the OM was occluded.  Dr. Juanda Chance felt continued  medical treatment and asked GI to continue to assess for further  evaluation.  May need treatment of the circumflex in the future and this  was reviewed with Bevelyn Buckles. Bensimhon, M.D.  Tobacco cessation and  cardiac rehab assisted with ambulation and education.  Echocardiogram  was also performed as described.  Case management assisted with  discharge needs and provided him with information on [facility] as he  does not have a primary care physician.  He also received 3 days of  medication from  the hospital.  It was felt that he could be discharged  home.   DISPOSITION:  Mr. Lince is discharged home.  He was not given  permission to return to work until after he sees his doctor, Dr.  Riley Kill, on June 07, 2006, at 4:15 p.m.  He is asked to maintain low  salt, fat, and cholesterol diet.  He was advised no driving, sexual  activity, or heavy exertion for 1 week.   WOUND CARE:  Per his catheterization site will be dictated per  supplemental sheet.  He was asked to bring all medications to all  appointments.   NEW MEDICATIONS:  1. Sublingual nitroglycerin p.r.n.  2. Lopressor 50 mg b.i.d.  3. Protonix 80 mg b.i.d.   He was asked to go by Dr. Rosalyn Charters office to receive samples of  Protonix.  He was asked to continue:  1. Aspirin 325 mg daily.  2. Lipitor 80 mg q.h.s.  3. Percocet as previously.  4. Valium 10 mg b.i.d. as previously.   He will also follow up with Wilhemina Bonito. Marina Goodell, M.D. on June 16, 2006, at  10:45 a.m.  He was also referred to cardiac rehab and vocational rehab.  He was advised no smoking or tobacco products, and no drugs.  He was  asked not to continue his Cardizem.  He will need to obtain a primary  care physician.  He will need blood work in 6-8 weeks for fasting lipids  and LFT's.      Joellyn Rued, PA-C      Gerrit Friends. Dietrich Pates, MD, [facility]  Electronically Signed    EW/MEDQ  D:  09/27/2006  T:  09/27/2006  Job:  [id number]   cc:   Wilhemina Bonito. Marina Goodell, MD  Arturo Morton. Riley Kill, MD, [facility]  [facility]

## 2011-02-26 NOTE — Cardiovascular Report (Signed)
[name], [name]             ACCOUNT NO.:  [id number]   MEDICAL RECORD NO.:  [id number]           PATIENT TYPE:   LOCATION:                                 FACILITY:   PHYSICIAN:  Arturo Morton. Riley Kill, M.D. [facility] DATE OF BIRTH:   DATE OF PROCEDURE:  05/14/2006  DATE OF DISCHARGE:                              CARDIAC CATHETERIZATION   INDICATIONS:  The patient is a 60 year old gentleman who underwent bypass  surgery in the past year.  Unfortunately, he presented to the emergency  room, and apparently was thought to have a GI bleed.  He also then had ST  elevation noted in the inferior leads suggestive of inferior infarction.  His enzymes rose substantially.  The exact location of the infarct was  unclear.  He has previously undergone revascularization surgery in  [place]; and those results are not available.  He had a heme-positive  stool in the emergency room.  He was initially treated conservatively as he  was felt to be about 14 hours out.  He continued to have some chest pain,  was seen by Dr. Gala Romney; and he recommended cardiac catheterization.   PROCEDURE:  1.  Left heart catheterization.  2.  Selective coronary arteriography.  3.  Saphenous vein graft angiography.  4.  Selective left internal mammary angiography.  5.  Aortic root aortography.   DESCRIPTION OF PROCEDURE:  The patient was brought to the catheterization  laboratory, and prepped and draped in the usual fashion through an anterior  puncture the right femoral artery was entered.  We placed a 4-French sheath.  The patient was fairly uncooperative with restless legs, and a quite a bit  of movement.  We gave him 2 mg of Versed; and he was modestly sedated with  this.  However he kept grabbing the sheath with his hand.  We were able to  take pictures, we elected to defer any other type of procedure, at the  present time, given the difficulty with cooperation, more importantly, the  recent heme-positive stools.   Dr. Gala Romney and I reviewed the films, in  detail; and he was taken to the holding area for sheath removal.   HEMODYNAMIC DATA:  1.  Central aortic pressure 125/90.  2.  Left ventricular pressure 132/19.  3.  No gradient or pullback across the aortic valve.   ANGIOGRAPHIC DATA:  1.  The left main is free of critical disease.  2.  The LAD has a 90% bifurcational disease.  There is competitive filling      in both the LAD and the diagonal.  3.  The saphenous vein graft to the diagonal was widely patent.  4.  The internal mammary to the LAD is widely patent.  5.  The circumflex demonstrates diffuse 70% disease in the midportion      overlapping the first small marginal.  The marginal, itself, has 80-90%      segmental disease.  More distal to this there is about 50% narrowing.      The distal vessel is intact.  6.  The right coronary artery is totally occluded which is  a new finding      from his original catheterization.  7.  The saphenous vein graft to the right appears to be intact.  It tapers      to about 50% at its insertion into the PDA.  It retrograde fills the PDA      where there is a 90% lesion, then another subtotal lesion and then the      posterolateral branch fills.  8.  Ventriculography in the RAO projection reveals ejection fraction of over      50%, but with mainly an inferobasal wall motion abnormality.   Dr. Gala Romney and I were not entirely clear as to what is the infarct  artery.  Our presumption is that the native right supplying the  posterolateral segment is probably the infarct, given the fairly small wall  motion abnormality, although the enzymes are fairly high.  He does have an  occluded vein graft, although we did not see an acute., nor do we see any  evidence of clotting she does have segmental disease in the circumflex; but  it is possible that the flow down the circumflex would not support a graft  originally.  There is a small subbranch that has  80-90%, but also could be a  source.  At the present time we will recommend medical therapy.  He may  eventually need a percutaneous intervention of the circumflex.  Importantly,  the patient may need a GI evaluation.  He will continue to be followed by  GI.      Arturo Morton. Riley Kill, M.D. [facility]  Electronically Signed     TDS/MEDQ  D:  05/14/2006  T:  05/15/2006  Job:  [id number]

## 2011-02-26 NOTE — H&P (Signed)
NAMEGILLIAN, [name] NO.:  [id number]   MEDICAL RECORD NO.:  [id number]          PATIENT TYPE:  INP   LOCATION:  1823                         FACILITY:  [facility]   PHYSICIAN:  Vida Roller, M.D.   DATE OF BIRTH:  1951-02-24   DATE OF ADMISSION:  05/13/2006  DATE OF DISCHARGE:                                HISTORY & PHYSICAL   PRIMARY CARE PHYSICIAN:  None.   PRIMARY CARDIOLOGIST:  None.   CARDIOVASCULAR THORACIC SURGERY SURGEON:  Dr. Randie Heinz at [facility].   CHIEF COMPLAINT:  Chest pain.   HISTORY OF PRESENT ILLNESS:  [name] is a 60 year old male with a  history of coronary artery disease.  He was wakened by substernal chest pain  described as a pressure at 4 a.m.  It reached a 10/10.  He took two  sublingual nitroglycerin with the second one being about 5 a.m.  His  symptoms eased some.  The chest pain was associated with nausea, shortness  of breath and diaphoresis but no vomiting.  He said he laid in bed until  later today when he told his family about his symptoms and they made him  come to the emergency room.  In the emergency room he is currently having  ongoing pain.   PAST MEDICAL HISTORY:  1.  Status post aortocoronary bypass surgery in 2006 x4.  2.  Hypertension.  3.  Hyperlipidemia.  4.  Ongoing tobacco use.  5.  Family history of premature coronary artery disease.  6.  Remote history of cocaine use.  7.  History of recent THC use.  8.  Degenerative joint disease in his back.   SURGICAL HISTORY:  Cardiac catheterization and bypass.   ALLERGIES:  CODEINE.   MEDICATIONS:  1.  Cardizem 120 mg t.i.d.  2.  Lipitor 20 mg a day.  3.  Aspirin 325 mg a day.  4.  Nexium 40 mg a day.  5.  Percocet 5/325 mg p.o. p.r.n.   SOCIAL HISTORY:  He lives in [place], [place] with his son.  He is  currently on leave from work because of back problems.  He has ongoing  tobacco use with greater than 40 pack-year history but  denies ETOH abuse,  although he has a history of it.  He last used marijuana about a week ago  and has a history of cocaine use but denies IV drug use and says that he  quit cocaine years ago.   FAMILY HISTORY:  His mother died at age 88 of an MI and his father died at  age 83 of an MI.  He has known CAD in one brother.   REVIEW OF SYSTEMS:  The chest pain is described above.  He had a black tarry  stool today but this is the first time he has ever had it.  He has frequent  reflux symptoms for which he takes Nexium.  He has chronic back pains.  He  denies any fevers or chills.  Review of systems is otherwise negative.   PHYSICAL EXAMINATION:  VITAL SIGNS:  He is  afebrile.  Blood pressure  162/100, pulse 82, respiratory rate 20.  GENERAL:  He is a well-developed, well-nourished white male with active  chest pain.  He has significant anxiety.  HEENT:  His head is normocephalic and atraumatic with pupils equal, round,  reactive to light and accommodation.  NECK:  There is no lymphadenopathy, bruits or JVD noted.  CV:  His heart is regular in rate and rhythm with an S1, S2 and S4 as well  as a 2/6 systolic ejection murmur.  LUNGS:  Clear to auscultation bilaterally.  SKIN:  He has multiple tattoos.  ABDOMEN:  Soft and nontender with active bowel sounds.  No  hepatosplenomegaly.  RECTAL:  The ER reports that he is guaiac positive.  EXTREMITIES:  He has 2+ pulses in all extremities with no cyanosis, clubbing  or edema noted.  MUSCULOSKELETAL:  He has got no joint deformity or effusions noted.  NEURO:  He is alert and oriented with cranial nerves II-XII grossly intact.   STUDIES:  EKG was sinus rhythm, rate 82 with a normal axis, normal P-R.  He  has ST elevation in II, III and aVF and a right bundle branch block.  There  is no old available for comparison.   LABORATORY VALUES:  Laboratory values are pending at the time of dictation  except for point-of-care markers that are elevated  with an MB greater than  80, myoglobin greater than 500 and troponin 2.75.   IMPRESSION:  1.  Acute ST-segment elevation myocardial infarction:  He has had prolonged      chest pain for greater than 12 hours.  He will be admitted to critical      care unit.  We will use aspirin, beta blocker and nitrates as well as      opiates to control his pain.  Because we are not able to anticoagulate      him, he will not be taken urgently to the catheterization lab. There is      not clear benefit to percutaneous revascularization after 12 hours and      there is a significant cintraindication to anticoagulation.  2.  Melena reported with guaiac-positive stool:  We will follow his      hemoglobin and hematocrit closely and transfuse on as-needed basis.  A      gastrointestinal consult will be considered but no urgent      gastrointestinal consult is needed as he cannot undergo any      gastrointestinal procedures right now.  3.  Hyperlipidemia:  His diet is poor.  We will check a lipid profile and      increase his Lipitor to 80 mg a day.  4.  Ongoing tobacco use:  Tobacco smoking cessation consult will be called.   Again, this is Theodore Demark, [facility] dictating for Dr. Vida Roller who saw  the patient and determined the plan of care.      Theodore Demark, P.A. [facility]      Vida Roller, M.D.  Electronically Signed    RB/MEDQ  D:  05/13/2006  T:  05/13/2006  Job:  [id number]

## 2011-02-26 NOTE — Discharge Summary (Signed)
[name], [name]             ACCOUNT NO.:  [id number]   MEDICAL RECORD NO.:  [id number]          PATIENT TYPE:  INP   LOCATION:  3710                         FACILITY:  [facility]   PHYSICIAN:  Arturo Morton. Riley Kill, MD, FACCDATE OF BIRTH:  December 07, 1950   DATE OF ADMISSION:  06/20/2006  DATE OF DISCHARGE:  06/22/2006                           DISCHARGE SUMMARY - REFERRING   DISCHARGING PHYSICIAN:  Dr. Riley Kill.   SUMMARY OF HISTORY:  [name] is a well-known 60 year old white male who  was recently admitted in August for chest discomfort. He had slight  progression of his coronary artery disease, and it was elected to treat him  medically. Since that time he has continued to have chest discomfort that  lasts from minutes to hours with variable relief of nitroglycerin. He does  not have exertional discomfort. He also noted some associated dyspnea. He  did attempt to follow up with GI for further evaluation, but was referred  back to Dr. Riley Kill for cardiac clearance. Perfusion scan on August 31  showed EF of 42%, no signs of ischemia with a fixed inferolateral defect. On  the day of admission he was told that his stepfather had inoperable cancer  and had three weeks to live. Shortly after this he developed chest  discomfort unresponsive to nitroglycerin associated with shortness of  breath; thus his ER presentation.   PAST MEDICAL HISTORY:  Notable for hypertension, hyperlipidemia, continued  tobacco use, anxiety, chronic back pain, maroon stools. GI evaluation  pending. History of cocaine use but he states he quit three years ago and  marijuana use which he has not supposedly used in two weeks.   LABORATORY DATA:  Chest x-ray shows no acute disease, stable cardiomegaly  without failure. Admission H&H was 13.8 and 40.9, normal indices, platelets  224, wbcs 10.8. On the 11th H&H of 12.4 and 36.2, normal indices, platelets  191, wbcs 8. Admission PTT 32, PT 14, INR 1. D-dimer 0.74.  Sodium 143,  potassium 39, BUN 11, creatinine 0.1. Normal LFTs. Amylase and lipase 56 and  29. CK-MB and troponin were within normal limits x2. Urine drug screen was  positive for benzodiazepines, cocaine, opiates, and THC. Stools were heme-  negative.   HOSPITAL COURSE:  [name] was admitted by Dr. Gala Romney. He was placed  on IV heparin as well as his home medications. Tobacco cessation consult was  again performed. They report that he is down to smoking approximately five  cigarettes per day, and the patient does plan to quit completely. Nursing  notes episodes of chest discomfort that did not show any changes on EKG. On  September 11 he was doing well. Anticipated plan was for cardiac  catheterization. D-dimer was noted to be elevated. However, Dr. Riley Kill felt  that with recent multiple evaluations, a CT would not be performed at this  time given his dye allergy. V/Q scan was unavailable. Dr. Riley Kill in the  evening of September 11 had a long discussion with the patient. Felt that he  could be discharged home in the morning with further outpatient evaluation.   DISCHARGE DIAGNOSES:  1. Chest  discomfort of uncertain etiology.  2. Continued tobacco and probable drug use.  3. Known coronary artery disease history as previously.   DISPOSITION:  He is discharged home.   MEDICATIONS:  1. Lipitor 80 mg q.h.s.  2. Aspirin 325 mg daily.  3. Percocet as previously.  4. Valium 10 mg b.i.d. as previously.  5. Lopressor 50 mg b.i.d.  6. Nitroglycerin 0.4.  7. Protonix as previously.   He was asked to begin a blood pressure diary to maintain low salt, fat, and  cholesterol. Bring all medications and diary to all appointments. He will  follow up with Dr. Riley Kill on July 12, 2006 at 1:45. At the time of follow-  up with Dr. Riley Kill, consideration and further evaluation and  recommendations to get the patient into cardiac rehab. Follow up with GI for  further evaluation. The patient  was also advised no smoking tobacco products  or drug usage. He was also asked to obtain a primary care physician and to  let Dr. Riley Kill know who this physician was at the time of his follow-up  appointment.     ______________________________  Joellyn Rued, PA-C      Arturo Morton. Riley Kill, MD, [facility]  Electronically Signed    EW/MEDQ  D:  06/22/2006  T:  06/22/2006  Job:  [id number]   cc:   Arturo Morton. Riley Kill, MD, [facility]

## 2011-02-26 NOTE — Assessment & Plan Note (Signed)
[facility]                              CARDIOLOGY OFFICE NOTE   NAME:Mcpeters, [name] TASSIN                    MRN:          [id number]  DATE:07/12/2006                            DOB:          08/28/1951    Mr. Bless is in for followup.  We last saw Mr. Nardozzi about a month ago.  He, at that time, had undergone a radionuclide imaging study.  This  demonstrated a fixed defect in the inferior and inferolateral wall with no  areas of reversibility and global hypokinesis with an ejection fraction of  42% with focal inferior abnormality.  This was compatible with his known  anatomic findings at cardiac catheterization.  We have reviewed his films  multiple times since discharge from the hospital and I have discussed them  with both Dr. Samule Ohm and Dr. Excell Seltzer.  The posterolateral system appears to  fill in retrograde fashion from the right and there is no antegrade flow  from the proximal right.  The patient was initially managed medically when  he presented with bright red blood per stool and Dr. Dorethea Clan elected medical  management.  After he was last seen he came back into the hospital with  recurrent episodes of chest pain. At that time, the patient's drug screen  was positive for both cocaine and THC.  He said that he was smoking  marijuana with his children and that they have done some cocaine, but he  denied that he was using cocaine at that time.  However, he says that he has  used it in the past.  He has continued to remain significantly hypertensive.  One of the big problems has been ongoing and continued back pain, which has  led to some disability, and he says he is unable to get more pain medicine  and he would like to have more pain medication.  He has been going to  [facility], but he has had trouble getting them to prescribe any  medications.  He is still smoking and has a history of bipolar illness.  He  has not had a lot of chest pain and  the biggest problem has been recurrent  continued back pain.   MEDICATIONS:  1. Aspirin 325 mg daily.  2. Lipitor 80 mg nightly.  3. Lopressor 50 mg b.i.d.  4. Protonix 80 mg daily.  5. Percocet b.i.d.  6. Valium 10 mg b.i.d.   PHYSICAL:  The blood pressure is 174/110, equal in both arms.  The pulse is  58.  The lung fields reveal some decrease in breath sounds.  The PMI is nondisplaced.  There is a widely-split second heart sound.   This remains a very difficult situation.  Mr. Wissing has very limited  resources.  He clearly has coronary artery disease, which is significant.  We have all been in agreement that the approach to this should be medical.  His blood pressure is not well-controlled.  In addition, the tox screens  raised a concern about possible continued drug use.  He and I have had a  long discussion about this today.  We  are going to go ahead and add Atacand  hydrochlorothiazide 16/12.5 to his regimen, and I plan to see him back in  followup in 2 days to reassess his blood pressure.  At that time, we may  elect to go ahead with a CT scan.  He is also going to need to have GI  followup.  The biggest limitation at the present time from his own  standpoint, is his continued back pain and his need for pain medications.  I  carefully described to him that I did not prescribe narcotic analgesics for  back pain, but we are in the process of attempting to make him a referral  for neurosurgical evaluation.   ADDENDUM:  The electrocardiogram demonstrates normal sinus rhythm with right  bundle branch block, inferior infarct of indeterminate age and no acute ST  segment changes with continued T wave inversion in the inferior leads.   His disability forms have been filled out.       Arturo Morton. Riley Kill, MD, [facility]     TDS/MedQ  DD:  07/19/2006  DT:  07/20/2006  Job #:  [id number]

## 2011-02-26 NOTE — Assessment & Plan Note (Signed)
[facility]                              CARDIOLOGY OFFICE NOTE   NAME:Bottomley, TOMY KHIM                    MRN:          [id number]  DATE:06/07/2006                            DOB:          1951/05/24    Mr. Saiz is a gentleman who was recently discharged from the hospital.  At the time of hospitalization, he came in saying that he had a large maroon  stool.  He was seen by Dr. Dorethea Clan in the emergency room.  He had a heme-  positive test at that time.  He also had evidence of a myocardial  infarction; therefore, urgent catheterization was delayed.  In the hospital,  the patient had fairly marked elevation in his enzymes.  His CPK-MB was 187  and the troponin-I reached as high as 17; they subsequently came down.  His  TSH was normal.  The patient had had a history of some dizziness for several  months.  The patient has had prior coronary artery bypass graft surgery done  in [place] in 2004 at [facility].  He underwent urgent cardiac  catheterization which we have reviewed in detail.  At that time, the  saphenous vein graft to the diagonal was widely patent.  The internal  mammary to the LAD was widely patent.  The circumflex, itself, had some  diffuse disease with 70% disease in the midportion overlapping the first  marginal.  The marginal, itself, had a 80% to 90% segmental disease or,  distal to this, there was about 50% narrowing.  The right coronary artery  was totally occluded which was a new finding from the original  catheterization.  The saphenous vein graft filled the PDA nicely but there  was a lesion proximal to this, and then there was slow filling into the  posterolateral segment.  Ventriculography in the RAO projection revealed an  ejection fraction of over 50% but with a small inferobasal wall motion  abnormality.  Follow-up echocardiogram did demonstrate well-preserved  overall LV function.  The patient has continued to have  some dizziness, and  this dizziness has persisted since a couple of weeks before his  catheterization.  He also has some intermittent shortness of breath as well  as some chest pain, but this has been somewhat difficult to discern.  It is  not clearly exertion related.   PHYSICAL EXAMINATION:  VITAL SIGNS:  The lying blood pressure was 142/90 and  with a pulse of 57.  Sitting, it was 147/92 with a pulse of 56.  The pulse  was basically unchanged standing and blood pressures ranged from 151/88 to  159/92.  NECK:  There were no definite carotid bruits.  LUNGS:  The lung fields revealed prolonged expiration but without rales.  The PMI was nondisplaced and there was not a significant murmur.  ABDOMEN:  Abdomen was soft and I could not appreciate masses.  RECTAL:  Rectal examination was done in the office today and was Hemoccult  negative.   An electrocardiogram today revealed sinus bradycardia with first-degree AV  block and right bundle branch block.  There is a  clear-cut inferior infarct  of indeterminate age with T-wave inversion inferiorly.   IMPRESSION:  1. Continued recurrent chest discomfort post myocardial infarction with      previous anatomic findings as noted above.  2. Preserved left ventricular function by 2-dimensional echocardiographic      study but inferior Qs by electrocardiogram suggesting a posterolateral      infarct as noted from the anatomic considerations.  3. Segmental disease of the circumflex.  4. Ongoing tobacco use.  5. Hyperlipidemia.  6. Hypertension.  7. History of recent tetrahydrocannabinol use and remote history of      cocaine use.   The patient will need follow-up lipid and liver profile.  I think we will  also go ahead and do a myocardial perfusion study to assess his residual  ischemia.  He does not have a good situation for percutaneous intervention.  Because of the continued dizziness, we may need to do some Doppler's and he  may need a  neurologic evaluation.  He also may ultimately need a MRI scan.  The patient does not currently have a primary care doctor either, which  could be an ongoing issue.                              Arturo Morton. Riley Kill, MD, [facility]    TDS/MedQ  DD:  06/11/2006  DT:  06/12/2006  Job #:  [id number]

## 2011-03-04 ENCOUNTER — Ambulatory Visit: Payer: Medicaid Other | Admitting: Cardiology

## 2011-04-06 ENCOUNTER — Encounter: Payer: Medicaid Other | Attending: Physical Medicine & Rehabilitation

## 2011-04-06 ENCOUNTER — Ambulatory Visit: Payer: Medicaid Other | Admitting: Physical Medicine & Rehabilitation

## 2011-05-07 ENCOUNTER — Encounter: Payer: Medicaid Other | Attending: Physical Medicine & Rehabilitation

## 2011-05-07 ENCOUNTER — Ambulatory Visit (HOSPITAL_BASED_OUTPATIENT_CLINIC_OR_DEPARTMENT_OTHER): Payer: Medicaid Other | Admitting: Physical Medicine & Rehabilitation

## 2011-05-07 DIAGNOSIS — M48061 Spinal stenosis, lumbar region without neurogenic claudication: Secondary | ICD-10-CM | POA: Insufficient documentation

## 2011-05-07 DIAGNOSIS — Z981 Arthrodesis status: Secondary | ICD-10-CM | POA: Insufficient documentation

## 2011-05-07 DIAGNOSIS — I69959 Hemiplegia and hemiparesis following unspecified cerebrovascular disease affecting unspecified side: Secondary | ICD-10-CM | POA: Insufficient documentation

## 2011-05-07 DIAGNOSIS — M961 Postlaminectomy syndrome, not elsewhere classified: Secondary | ICD-10-CM | POA: Insufficient documentation

## 2011-05-07 DIAGNOSIS — M4712 Other spondylosis with myelopathy, cervical region: Secondary | ICD-10-CM

## 2011-05-07 DIAGNOSIS — M545 Low back pain, unspecified: Secondary | ICD-10-CM | POA: Insufficient documentation

## 2011-05-10 NOTE — Consult Note (Signed)
A 60 year old male with chief complaint of low back pain.  The patient has multiple secondary complaints including neck pain, hand numbness and to a lesser extent foot numbness.  His average pain is about 8/10.  He has a history of cervical stenosis and has undergone posterior fusion C3-C7 with Dr. Yevette Edwards, February 2012.  He also has a history of lumbar spinal stenosis, has not had any conservative care. He has reportedly had lumbar myelogram and this was referred to in the notes from Dr. Marshell Levan office.  It makes note of severe spinal stenosis.  The patient has pain that worsens with walking, bending, sitting and standing; improves with medication.  Relief from meds is fair to good.  He walks with a walker, 5-minute walking tolerance, but has additional functional disability from prior stroke causing left hemiparesis.  He also needs help with dressing, bathing, toileting, meal prep, household duties and shopping.  His review of systems are positive for bladder control problems, bowel control problems, weakness, numbness, tremor, tingling, trouble walking, spasms, dizziness, confusion, depression, anxiety, constipation, urine retention, coughing, shortness breath, wheezing and he has a history of COPD.  Other past medical history is significant for coronary artery disease, status post bypass in 2006 with some reocclusion of graft.  Noted at last Cardiology report.  He has a history of seizure disorder and this occurred after a stroke.  He has hypertension.  He has anxiety disorder, has been on q.i.d. Valium for number of years.  He reportedly has bipolar disorder as well, is not on any medicines for that right now. Has a history of cocaine as well as marijuana abuse.  FAMILY MEDICAL HISTORY:  Heart disease, lung disease, diabetes and hypertension.  PSYCHIATRIC PROBLEMS:  Alcohol abuse in the father, disability.  SOCIAL HISTORY:  Divorced, lives with his ex-wife and daughter  and son- in-law.  Previously, incarcerated in the last couple years.  Smokes three-quarter pack per day.  PHYSICAL EXAMINATION:  VITAL SIGNS:  Blood pressure 151/84, pulse 85, respirations 18 and O2 sat 95% on room air. EXTREMITIES:  He has 4-/5 strength bilateral deltoid, biceps, triceps and grip.  He has had decreased sensation to pinprick, bilateral arms in the non dermatomal pattern.  Negative Hoffman reflex.  Neck range of motion has reduced forward flexion, extension, lateral rotation and bending.  Healed posterior scar in the cervical area.  His straight leg raising causes pain, but mainly in the thigh and back of the knee bilaterally.  Sensation is normal bilateral lower extremities.  Deep tendon reflexes are normal bilateral lower extremities.  Upper extremity reflexes are 3+ bilateral triceps, normal in the biceps and brachioradialis.  Gait is forward flexed at the hip kyphotic posture. No evidence of toe drag or knee instability.  He does have tight hamstrings bilaterally, but no knee contracture.  No ankle contracture.  IMPRESSION: 1. Chronic pain in the lumbar stenosis as well as cervical post     laminectomy syndrome.  In terms of his lumbar stenosis, he really     has not had any conservative care thus far and plans to start some     injections with Dr. Regino Schultze as well as start with some physical     therapy. 2. History of pontine cerebrovascular accident causing a mobility as     well as ADL deficits. 3. Polysubstance abuse.  Looking at his opioid risk tool score, he     scores 9 which puts him at a greater than 90% chance of her  developing problematic behaviors on chronic opioids.  He mentions     that he maybe having another spinal surgery in the lumbar area as     soon as next month.  Certainly, once he gets through the postop     period consider wean of narcotic analgesics. 4. I do not feel comfortable prescribing narcotic analgesics.  He     requests a second  opinion for this.  I have given a list of other     pain practices in the area.  He apparently had gone to [facility] and states that he was not discharged, but rather no longer     wished to go there. 5. In terms of his upper extremity numbness, he has multiple potential     etiologies including a cervical myelopathy residuals as well as     prior history of stroke and had some upper extremity artery harvest     in the radial which could have caused at least problems on the left     side.  He may benefit from EMG and CV which can presumably be done     through Dr. Regino Schultze. 6. I discussed with the patient, agrees with plan.  I will not be     scheduling discharge, will not be scheduling further appointments,     this will serve as consultation only.     Erick Colace, M.D. Electronically Signed    AEK/MedQ D:05/07/2011 12:24:41  T:05/07/2011 12:48:02  Job #:  [id number]

## 2011-05-31 ENCOUNTER — Telehealth: Payer: Self-pay | Admitting: Physician Assistant

## 2011-05-31 ENCOUNTER — Ambulatory Visit (INDEPENDENT_AMBULATORY_CARE_PROVIDER_SITE_OTHER): Payer: Medicaid Other | Admitting: *Deleted

## 2011-05-31 DIAGNOSIS — I472 Ventricular tachycardia: Secondary | ICD-10-CM

## 2011-05-31 NOTE — Telephone Encounter (Signed)
Pt's daughter called in tonight to ask if the patient having his device check would cause him to feel unwell this afternoon. I reviewed his Device Check clinic note with Gypsy Balsam, RN - no changes were made to the device and all parameters were in working order. Gearldine Bienenstock states her dad just "looked green" for a brief time earlier. No CP, palps, nausea, vomiting, ICD discharge or tone from device. He is feeling better now. She was just concerned to see if this is normal after a device check. I advised her that if he was feeling sick, it was likely not from his device interrogation. She wonders if it just happened from the stress of the day and states she will keep a close eye on him the rest of the night, and will call back if he has recurrent problems.

## 2011-05-31 NOTE — Progress Notes (Signed)
ICD check 

## 2011-06-28 ENCOUNTER — Encounter (HOSPITAL_COMMUNITY)
Admission: RE | Admit: 2011-06-28 | Discharge: 2011-06-28 | Disposition: A | Discharge status: Home / Self Care | Payer: Medicaid Other | Source: Ambulatory Visit | Attending: Orthopedic Surgery | Admitting: Orthopedic Surgery

## 2011-06-28 LAB — URINALYSIS, ROUTINE W REFLEX MICROSCOPIC
Glucose, UA: NEGATIVE mg/dL
Hgb urine dipstick: NEGATIVE
Ketones, ur: NEGATIVE mg/dL
Leukocytes, UA: NEGATIVE
Protein, ur: NEGATIVE mg/dL
Urobilinogen, UA: 1 mg/dL (ref 0.0–1.0)

## 2011-06-28 LAB — COMPREHENSIVE METABOLIC PANEL
ALT: 12 U/L (ref 0–53)
CO2: 30 mEq/L (ref 19–32)
Calcium: 10.3 mg/dL (ref 8.4–10.5)
Creatinine, Ser: 0.88 mg/dL (ref 0.50–1.35)
GFR calc Af Amer: >60 mL/min (ref >60)
GFR calc non Af Amer: >60 mL/min (ref >60)
Glucose, Bld: 105 mg/dL — ABNORMAL HIGH (ref 70–99)
Sodium: 140 mEq/L (ref 135–145)
Total Protein: 7 g/dL (ref 6.0–8.3)

## 2011-06-28 LAB — DIFFERENTIAL
Basophils Absolute: 0 10*3/uL (ref 0.0–0.1)
Basophils Relative: 0 % (ref 0–1)
Eosinophils Absolute: 0.2 10*3/uL (ref 0.0–0.7)
Eosinophils Relative: 2 % (ref 0–5)
Neutrophils Relative %: 78 % — ABNORMAL HIGH (ref 43–77)

## 2011-06-28 LAB — APTT: aPTT: 34 seconds (ref 24–37)

## 2011-06-28 LAB — CBC
Platelets: 216 10*3/uL (ref 150–400)
RBC: 4.82 MIL/uL (ref 4.22–5.81)
RDW: 12.5 % (ref 11.5–15.5)
WBC: 8.4 10*3/uL (ref 4.0–10.5)

## 2011-06-28 LAB — PROTIME-INR: Prothrombin Time: 13.6 seconds (ref 11.6–15.2)

## 2011-06-30 LAB — DIFFERENTIAL
Basophils Absolute: 0
Basophils Absolute: 0
Basophils Relative: 0
Basophils Relative: 1
Eosinophils Absolute: 0.2
Eosinophils Absolute: 0.2
Eosinophils Relative: 3
Eosinophils Relative: 3
Lymphs Abs: 2.2
Monocytes Absolute: 0.3
Neutrophils Relative %: 70

## 2011-06-30 LAB — I-STAT 8, (EC8 V) (CONVERTED LAB)
Acid-Base Excess: 2
Chloride: 104
HCT: 48
Hemoglobin: 16.3
Operator id: 257131
Sodium: 140
pCO2, Ven: 53.8 — ABNORMAL HIGH

## 2011-06-30 LAB — BENZODIAZEPINE, QUANTITATIVE, URINE: Oxazepam GC/MS Conf: 150 ng/mL

## 2011-06-30 LAB — URINALYSIS, ROUTINE W REFLEX MICROSCOPIC
Bilirubin Urine: NEGATIVE
Nitrite: NEGATIVE
Specific Gravity, Urine: 1.022
Urobilinogen, UA: 0.2
pH: 6.5

## 2011-06-30 LAB — BASIC METABOLIC PANEL
BUN: 11
Calcium: 8.9
Creatinine, Ser: 1.04
GFR calc non Af Amer: >60
Glucose, Bld: 128 — ABNORMAL HIGH
Potassium: 3.6

## 2011-06-30 LAB — CARDIAC PANEL(CRET KIN+CKTOT+MB+TROPI)
Relative Index: 2.3
Total CK: 107
Troponin I: 0.02

## 2011-06-30 LAB — DRUGS OF ABUSE SCREEN W/O ALC, ROUTINE URINE
Barbiturate Quant, Ur: NEGATIVE
Marijuana Metabolite: NEGATIVE
Methadone: NEGATIVE
Phencyclidine (PCP): NEGATIVE
Propoxyphene: NEGATIVE

## 2011-06-30 LAB — CBC
HCT: 41.4
HCT: 44.7
Hemoglobin: 14.2
MCHC: 34
MCHC: 34.2
MCV: 90.4
MCV: 90.5
Platelets: 175
Platelets: 198
Platelets: 201
Platelets: 234
RDW: 12.4
RDW: 12.5
RDW: 13.1
WBC: 15 — ABNORMAL HIGH
WBC: 7.4

## 2011-06-30 LAB — POCT I-STAT CREATININE: Creatinine, Ser: 1.1

## 2011-06-30 LAB — HEPARIN LEVEL (UNFRACTIONATED)
Heparin Unfractionated: 0.68
Heparin Unfractionated: <0.1 — ABNORMAL LOW

## 2011-06-30 LAB — COMPREHENSIVE METABOLIC PANEL
ALT: 12
AST: 14
Albumin: 3.3 — ABNORMAL LOW
Alkaline Phosphatase: 69
Chloride: 106
GFR calc Af Amer: >60
Potassium: 3.4 — ABNORMAL LOW
Total Bilirubin: 0.8

## 2011-06-30 LAB — RAPID URINE DRUG SCREEN, HOSP PERFORMED
Cocaine: NOT DETECTED
Opiates: NOT DETECTED
Tetrahydrocannabinol: POSITIVE — AB

## 2011-06-30 LAB — LIPID PANEL
Cholesterol: 102
HDL: 27 — ABNORMAL LOW
Total CHOL/HDL Ratio: 3.8

## 2011-06-30 LAB — POCT CARDIAC MARKERS
CKMB, poc: 1.5
Operator id: 257131

## 2011-06-30 LAB — PROTIME-INR: Prothrombin Time: 13.2

## 2011-07-01 ENCOUNTER — Inpatient Hospital Stay (HOSPITAL_COMMUNITY): Payer: Medicaid Other

## 2011-07-01 ENCOUNTER — Inpatient Hospital Stay (HOSPITAL_COMMUNITY)
Admission: RE | Admit: 2011-07-01 | Discharge: 2011-07-05 | DRG: 458 | Disposition: A | Discharge status: Home Health | Payer: Medicaid Other | Source: Ambulatory Visit | Attending: Orthopedic Surgery | Admitting: Orthopedic Surgery

## 2011-07-01 DIAGNOSIS — Z95810 Presence of automatic (implantable) cardiac defibrillator: Secondary | ICD-10-CM

## 2011-07-01 DIAGNOSIS — Z8673 Personal history of transient ischemic attack (TIA), and cerebral infarction without residual deficits: Secondary | ICD-10-CM

## 2011-07-01 DIAGNOSIS — J4489 Other specified chronic obstructive pulmonary disease: Secondary | ICD-10-CM | POA: Diagnosis present

## 2011-07-01 DIAGNOSIS — K219 Gastro-esophageal reflux disease without esophagitis: Secondary | ICD-10-CM | POA: Diagnosis present

## 2011-07-01 DIAGNOSIS — M5137 Other intervertebral disc degeneration, lumbosacral region: Secondary | ICD-10-CM | POA: Diagnosis present

## 2011-07-01 DIAGNOSIS — I10 Essential (primary) hypertension: Secondary | ICD-10-CM | POA: Diagnosis present

## 2011-07-01 DIAGNOSIS — J449 Chronic obstructive pulmonary disease, unspecified: Secondary | ICD-10-CM | POA: Diagnosis present

## 2011-07-01 DIAGNOSIS — I2510 Atherosclerotic heart disease of native coronary artery without angina pectoris: Secondary | ICD-10-CM | POA: Diagnosis present

## 2011-07-01 DIAGNOSIS — M4120 Other idiopathic scoliosis, site unspecified: Principal | ICD-10-CM | POA: Diagnosis present

## 2011-07-01 DIAGNOSIS — Z01812 Encounter for preprocedural laboratory examination: Secondary | ICD-10-CM

## 2011-07-01 DIAGNOSIS — I252 Old myocardial infarction: Secondary | ICD-10-CM

## 2011-07-01 DIAGNOSIS — F17200 Nicotine dependence, unspecified, uncomplicated: Secondary | ICD-10-CM | POA: Diagnosis present

## 2011-07-01 DIAGNOSIS — M51379 Other intervertebral disc degeneration, lumbosacral region without mention of lumbar back pain or lower extremity pain: Secondary | ICD-10-CM | POA: Diagnosis present

## 2011-07-01 LAB — DIFFERENTIAL
Basophils Absolute: 0
Basophils Relative: 0
Eosinophils Absolute: 0.3
Eosinophils Relative: 2
Monocytes Absolute: 0.4

## 2011-07-01 LAB — CULTURE, BLOOD (ROUTINE X 2)

## 2011-07-01 LAB — COMPREHENSIVE METABOLIC PANEL
ALT: 16
AST: 18
AST: 18
Albumin: 3.9
Albumin: 4.1
Alkaline Phosphatase: 92
CO2: 31
Chloride: 100
Chloride: 103
Creatinine, Ser: 1.07
GFR calc Af Amer: >60
GFR calc Af Amer: >60
GFR calc non Af Amer: >60
Potassium: 3.4 — ABNORMAL LOW
Potassium: 3.6
Sodium: 139
Total Bilirubin: 0.9
Total Bilirubin: 1

## 2011-07-01 LAB — RAPID URINE DRUG SCREEN, HOSP PERFORMED
Amphetamines: NOT DETECTED
Benzodiazepines: POSITIVE — AB
Cocaine: NOT DETECTED
Tetrahydrocannabinol: NOT DETECTED

## 2011-07-01 LAB — CBC
HCT: 41.7
Hemoglobin: 14.1
MCV: 92
Platelets: 228
RDW: 13.6
WBC: 15.7 — ABNORMAL HIGH

## 2011-07-01 LAB — PROTIME-INR: Prothrombin Time: 13.1

## 2011-07-01 LAB — CK TOTAL AND CKMB (NOT AT ARMC): CK, MB: 2

## 2011-07-02 LAB — CBC
HCT: 41.2
Hemoglobin: 9.3 g/dL — ABNORMAL LOW (ref 13.0–17.0)
Hemoglobin: 13.9
Hemoglobin: 14
Hemoglobin: 14.6
MCH: 31.2 pg (ref 26.0–34.0)
MCHC: 34.2 g/dL (ref 30.0–36.0)
MCHC: 33.7
MCHC: 33.9
MCV: 91.3 fL (ref 78.0–100.0)
MCV: 92.7
MCV: 92.7
Platelets: 106 10*3/uL — ABNORMAL LOW (ref 150–400)
Platelets: 161
RBC: 2.98 MIL/uL — ABNORMAL LOW (ref 4.22–5.81)
RDW: 13.6
RDW: 13.6
WBC: 8

## 2011-07-02 LAB — POCT I-STAT 7, (LYTES, BLD GAS, ICA,H+H)
Acid-Base Excess: 5 mmol/L — ABNORMAL HIGH (ref 0.0–2.0)
Bicarbonate: 29.2 mEq/L — ABNORMAL HIGH (ref 20.0–24.0)
Bicarbonate: 26.7 mEq/L — ABNORMAL HIGH (ref 20.0–24.0)
HCT: 34 % — ABNORMAL LOW (ref 39.0–52.0)
HCT: 30 % — ABNORMAL LOW (ref 39.0–52.0)
Hemoglobin: 10.2 g/dL — ABNORMAL LOW (ref 13.0–17.0)
O2 Saturation: 100 %
Patient temperature: 37.3
Sodium: 146 mEq/L — ABNORMAL HIGH (ref 135–145)
Sodium: 139 mEq/L (ref 135–145)
TCO2: 28 mmol/L (ref 0–100)
pCO2 arterial: 37.8 mmHg (ref 35.0–45.0)
pH, Arterial: 7.459 — ABNORMAL HIGH (ref 7.350–7.450)
pO2, Arterial: 474 mmHg — ABNORMAL HIGH (ref 80.0–100.0)

## 2011-07-02 LAB — I-STAT 8, (EC8 V) (CONVERTED LAB)
Acid-Base Excess: 2
BUN: 11
Bicarbonate: 24.6 — ABNORMAL HIGH
Chloride: 107
Glucose, Bld: 113 — ABNORMAL HIGH
HCT: 45
Hemoglobin: 15.3
Operator id: 146091
Potassium: 4.2
Sodium: 137
TCO2: 26
pCO2, Ven: 33.5 — ABNORMAL LOW
pH, Ven: 7.473 — ABNORMAL HIGH

## 2011-07-02 LAB — POCT I-STAT CREATININE
Creatinine, Ser: 1.5
Operator id: 146091

## 2011-07-02 LAB — BASIC METABOLIC PANEL
Calcium: 9
GFR calc Af Amer: >60
GFR calc non Af Amer: >60
Sodium: 141

## 2011-07-02 LAB — COMPREHENSIVE METABOLIC PANEL
Alkaline Phosphatase: 79
BUN: 8
CO2: 26 mEq/L (ref 19–32)
Calcium: 7.9 mg/dL — ABNORMAL LOW (ref 8.4–10.5)
Creatinine, Ser: 0.74 mg/dL (ref 0.50–1.35)
GFR calc Af Amer: >60 mL/min (ref >60)
GFR calc non Af Amer: >60 mL/min (ref >60)
Glucose, Bld: 102 mg/dL — ABNORMAL HIGH (ref 70–99)
Glucose, Bld: 100 — ABNORMAL HIGH
Potassium: 4.1
Total Bilirubin: 0.6
Total Protein: 6.3

## 2011-07-02 LAB — DIFFERENTIAL
Basophils Absolute: 0
Basophils Relative: 0
Monocytes Relative: 4
Neutro Abs: 6.9
Neutrophils Relative %: 71

## 2011-07-02 LAB — POCT CARDIAC MARKERS
CKMB, poc: 2.4
Myoglobin, poc: 101
Operator id: 146091
Troponin i, poc: <0.05

## 2011-07-02 LAB — CARDIAC PANEL(CRET KIN+CKTOT+MB+TROPI)
CK, MB: 12.6 — ABNORMAL HIGH
Total CK: 186
Troponin I: 0.59

## 2011-07-02 LAB — APTT: aPTT: 163 — ABNORMAL HIGH

## 2011-07-02 LAB — HEPARIN LEVEL (UNFRACTIONATED)
Heparin Unfractionated: 0.38
Heparin Unfractionated: 0.62

## 2011-07-02 LAB — LIPID PANEL
HDL: 34 — ABNORMAL LOW
Total CHOL/HDL Ratio: 5.1

## 2011-07-02 LAB — TROPONIN I: Troponin I: 0.83

## 2011-07-02 LAB — CK TOTAL AND CKMB (NOT AT ARMC): CK, MB: 14.5 — ABNORMAL HIGH

## 2011-07-02 LAB — MAGNESIUM: Magnesium: 1.9

## 2011-07-02 LAB — PROTIME-INR: INR: 1

## 2011-07-03 LAB — CBC
Platelets: 109 10*3/uL — ABNORMAL LOW (ref 150–400)
RBC: 3.01 MIL/uL — ABNORMAL LOW (ref 4.22–5.81)
RDW: 12.6 % (ref 11.5–15.5)
WBC: 10.6 10*3/uL — ABNORMAL HIGH (ref 4.0–10.5)

## 2011-07-03 LAB — COMPREHENSIVE METABOLIC PANEL
ALT: 12 U/L (ref 0–53)
AST: 24 U/L (ref 0–37)
Albumin: 2.6 g/dL — ABNORMAL LOW (ref 3.5–5.2)
CO2: 28 mEq/L (ref 19–32)
Chloride: 103 mEq/L (ref 96–112)
GFR calc non Af Amer: >60 mL/min (ref >60)
Sodium: 136 mEq/L (ref 135–145)
Total Bilirubin: 0.7 mg/dL (ref 0.3–1.2)

## 2011-07-04 LAB — BASIC METABOLIC PANEL
CO2: 30 mEq/L (ref 19–32)
Calcium: 8.4 mg/dL (ref 8.4–10.5)
Creatinine, Ser: 0.78 mg/dL (ref 0.50–1.35)
GFR calc non Af Amer: >60 mL/min (ref >60)
Glucose, Bld: 119 mg/dL — ABNORMAL HIGH (ref 70–99)
Sodium: 133 mEq/L — ABNORMAL LOW (ref 135–145)

## 2011-07-04 LAB — CBC
Hemoglobin: 8.6 g/dL — ABNORMAL LOW (ref 13.0–17.0)
MCH: 30.9 pg (ref 26.0–34.0)
MCHC: 33.6 g/dL (ref 30.0–36.0)
MCV: 92.1 fL (ref 78.0–100.0)
Platelets: 116 10*3/uL — ABNORMAL LOW (ref 150–400)
RBC: 2.78 MIL/uL — ABNORMAL LOW (ref 4.22–5.81)

## 2011-07-05 LAB — CBC
Hemoglobin: 14
RBC: 4.44
WBC: 9.2

## 2011-07-06 LAB — TYPE AND SCREEN
Antibody Screen: NEGATIVE
Unit division: 0

## 2011-07-08 LAB — DIFFERENTIAL
Eosinophils Absolute: 0.4
Eosinophils Relative: 4
Lymphs Abs: 3
Monocytes Absolute: 0.7
Monocytes Relative: 6
Neutrophils Relative %: 63

## 2011-07-08 LAB — POCT CARDIAC MARKERS
CKMB, poc: 2
Troponin i, poc: <0.05

## 2011-07-08 LAB — CARDIAC PANEL(CRET KIN+CKTOT+MB+TROPI)
CK, MB: 2
Relative Index: INVALID
Relative Index: INVALID
Relative Index: INVALID
Total CK: 75
Total CK: 99

## 2011-07-08 LAB — CBC
HCT: 39.9
Hemoglobin: 14
MCHC: 35.2
MCV: 92
RBC: 4.34
WBC: 11.6 — ABNORMAL HIGH

## 2011-07-08 LAB — POCT I-STAT, CHEM 8
Calcium, Ion: 1.12
Creatinine, Ser: 1.3
Glucose, Bld: 92
Hemoglobin: 14.3
Potassium: 4.3

## 2011-07-12 ENCOUNTER — Emergency Department (HOSPITAL_COMMUNITY): Payer: Medicaid Other

## 2011-07-12 ENCOUNTER — Emergency Department (HOSPITAL_COMMUNITY)
Admission: EM | Admit: 2011-07-12 | Discharge: 2011-07-12 | Disposition: A | Discharge status: Home / Self Care | Payer: Medicaid Other | Attending: Emergency Medicine | Admitting: Emergency Medicine

## 2011-07-12 DIAGNOSIS — I10 Essential (primary) hypertension: Secondary | ICD-10-CM | POA: Insufficient documentation

## 2011-07-12 DIAGNOSIS — M549 Dorsalgia, unspecified: Secondary | ICD-10-CM | POA: Insufficient documentation

## 2011-07-12 DIAGNOSIS — Z95810 Presence of automatic (implantable) cardiac defibrillator: Secondary | ICD-10-CM | POA: Insufficient documentation

## 2011-07-12 DIAGNOSIS — I252 Old myocardial infarction: Secondary | ICD-10-CM | POA: Insufficient documentation

## 2011-07-12 DIAGNOSIS — G319 Degenerative disease of nervous system, unspecified: Secondary | ICD-10-CM | POA: Insufficient documentation

## 2011-07-12 DIAGNOSIS — I25810 Atherosclerosis of coronary artery bypass graft(s) without angina pectoris: Secondary | ICD-10-CM | POA: Insufficient documentation

## 2011-07-12 DIAGNOSIS — T1490XA Injury, unspecified, initial encounter: Secondary | ICD-10-CM | POA: Insufficient documentation

## 2011-07-12 DIAGNOSIS — S0990XA Unspecified injury of head, initial encounter: Secondary | ICD-10-CM | POA: Insufficient documentation

## 2011-07-12 DIAGNOSIS — W06XXXA Fall from bed, initial encounter: Secondary | ICD-10-CM | POA: Insufficient documentation

## 2011-07-12 DIAGNOSIS — Z981 Arthrodesis status: Secondary | ICD-10-CM | POA: Insufficient documentation

## 2011-07-12 DIAGNOSIS — Y92009 Unspecified place in unspecified non-institutional (private) residence as the place of occurrence of the external cause: Secondary | ICD-10-CM | POA: Insufficient documentation

## 2011-07-12 NOTE — Op Note (Signed)
[name], [name] NO.:  [id number]  MEDICAL RECORD NO.:  [id number]  LOCATION:  3313                         FACILITY:  [facility]  PHYSICIAN:  Estill Bamberg, MD      DATE OF BIRTH:  11/24/1950  DATE OF PROCEDURE:  07/01/2011 DATE OF DISCHARGE:                              OPERATIVE REPORT   PREOPERATIVE DIAGNOSES: 1. Severe spinal stenosis L1-S1. 2. Degenerative lumbar scoliosis involving the thoracolumbar and     lumbar spine.  POSTOPERATIVE DIAGNOSES: 1. Severe spinal stenosis L1-S1. 2. Degenerative lumbar scoliosis involving the thoracolumbar and     lumbar spine.  PROCEDURE: 1. L5-S1 transforaminal lumbar interbody fusion with contralateral     posterolateral fusion at the same level. 2. Posterolateral fusion T10-11, T11-12, T12-L1, L1-L2, L2-L3, L3-L4,     L4-L5. 3. Insertion of posterior spinal instrumentation T10 to the sacrum. 4. Insertion of interbody device, L5-S1 (Concorde bullet 9 x 8 x 27-mm     lordotic cage). 5. Use of local autograft. 6. Intraoperative bone marrow aspiration. 7. Intraoperative use of fluoroscopy.  SURGEON:  Estill Bamberg, MD  ASSISTANT:  Skip Mayer, [facility]  ANESTHESIA:  General endotracheal anesthesia.  COMPLICATIONS:  None.  DISPOSITION:  Stable.  ESTIMATED BLOOD LOSS:  1500 mL.  INSTRUMENTATION USED:  Expedium instrumentation (5 x 35 mm screws at T10, 5 x 40 mm screws T11, T12, L1, 6 x 45 mm screws at L2, 7 x 45-mm screws L3, L4, L5, 8 x 45 mm screws, S1).  INDICATIONS FOR PROCEDURE:  Briefly, [name] is a very pleasant 60- year-old male who previously underwent a posterior cervical decompression and fusion by me.  The patient went onto have ongoing weakness and debilitating pain in his bilateral legs.  I did review a CT myelogram which is notable for severe spinal stenosis extending across L1-2 down to L5-S1.  The patient was also noted to have degenerative scoliosis.  We did go forward with  extensive nonoperative measures but the patient continued to have pain and weakness.  We therefore did have a discussion regarding going forward with a lumbar decompression from L1- S1.  Given the patient's degenerative scoliosis which did extend into the thoracolumbar junction, we did also discuss posterior spinal fusion from T10 to the sacrum.  Given the fact the patient did smoke and given the long fusion construct, we did also decide to go forward with a left- sided L5-S1 transforaminal lumbar interbody fusion.  OPERATIVE DETAILS:  On July 01, 2011, the patient was brought to surgery and general endotracheal anesthesia was administered.  The patient was placed prone on a well-padded Jackson spinal frame.  All bony prominences were meticulously padded.  SCDs were placed and antibiotics were given.  Of note, antibiotics were redosed every 4 hours throughout the procedure.  The back was then prepped and draped in the usual sterile fashion.  I made an incision from approximately spinous process of T9 to approximately the spinous process of S1.  The posterior elements were readily identified.  The fascia was sharply incised at the midline using electrocautery.  The posterolateral gutters were thoroughly exposed on the right and left side, exposing the facet joints in the transverse  processes of the levels to be fused.  Of note, the retractors were relaxed approximately every 30 minutes and the wound was copiously irrigated approximately every hour to help minimize the chance of infection.  Once the posterolateral gutters were thoroughly exposed, I did go forward with cannulating the pedicles first on the right and then on the left sides.  This was done using a 4-mm bur followed by lengthy gearshift probe.  I did use a ball-tip probe to confirm that there was no cortical violation at every pedicle that was cannulated.  I then used a tap approximately 1 mm less than the ultimate size of  the screws.  The size of the screws were noted in the dictation above.  The holes were cannulated on both the right and left sides.  Bone wax was placed over the holes to minimize bleeding.  Of note, there was no cortical violation noted after placement of all the screws.  I did liberally use AP and lateral fluoroscopy while placing the S1 pedicle screws and while placing the screws at the T10 level.  Once the holes were cannulated, the posterolateral gutters were packed to minimize bleeding and I did go forward with the transforaminal lumbar interbody fusion aspect of the procedure.  I did use an osteotome to entirely remove the anterior inferior articular process of L5.  A Kerrison punch was used to remove the superior articular process of S1.  The traversing S1 nerve was readily identified and was mobilized medially.  The L5-S1 intervertebral space was readily noted and was incised using #15 blade knife.  I did go forward with a thorough diskectomy and was very happy with the final diskectomy noted.  I then made a separate incision over the patient's right iliac crest and approximately 7 mL of bone marrow aspirate was obtained.  This was mixed with a 10 mL pack of Vitoss BA. The Vitoss BA bone marrow aspirate mixture was mixed.  Autograft obtained from the removal of the left-sided facet joint was mixed with this as well.  I then went forward placing a series of trials.  I did feel that an 8-mm Concorde bullet trial would be the most appropriate fit.  The 8-mm bullet cage was packed with the Vitoss BA/bone marrow aspirate/autograft mixture.  Prior to placing the interbody cage, the Vitoss mixture was packed liberally into the intervertebral space.  I then tamped the interbody device into the appropriate position using both AP and lateral fluoroscopy.  I was extremely pleased with the final press fit.  This was done with distraction across the posterior elements.  Again, an excellent  press fit was noted.  The remainder of the interbody space was packed using the remainder of the Vitoss BA/bone marrow aspirate/autograft mixture.  I then turned my attention towards the remainder of the decompression.  The posterior elements of L1, L2, L3, L4, and L5 were removed.  Starting inferiorly, I did go forward with a central and lateral recess decompression.  I then went forward with a thorough neuroforaminal decompression of both the right and left sides working from superiorly to inferiorly.  This portion of the procedure did take approximately 3-4 hours, as the stenosis was noted to be prominent and the nerve compression was noted to be severe.  Of note, there was readily noted to be a significant facet cyst on the patient's left side at the L3-4 level.  This was meticulously removed and the undue compression was taken down as well.  Of  particular note, there was no extravasation of cerebrospinal fluid noted throughout the decompressive aspect of the procedure.  At the termination of the decompression, I was easily able to pass a [place] out the neural foramen on both the right and left side at the L1-2, L2-3, L3-4, L4-5, and L5-S1 levels.  The posterolateral gutters were packed with Gelfoam and thrombin.  At this point, I went forward with placing the instrumentation.  Pedicle screws were placed from T10 down to the sacrum under fluoroscopy to help optimize the location of the screws.  I then selected an appropriately sized 5.5-mm titanium rod and it was fashioned into the appropriate position.  The rod was placed on the right and caps were then placed.  The final locking procedure was performed.  Screws were then placed on the left side followed by a 5.5 mm rod.  I was very happy with the final press fit of the screws and the final appearance on both AP and lateral radiographs.  Of particular note, prior to placing the hardware, I did liberally use a 4-mm bur to decorticate  the transverse processes from L1-L5.  I also decorticated the sacral ala as well as the facet joints to be fused.  The posterior elements of T10, T11, and T12 were also liberally decorticated.  50% of the Vitoss BA/bone marrow aspirate/autograft that was obtained from the decompression was packed in the posterior elements and the posterolateral gutter on the right side prior to placing the screws.  A decortication and fusion bed preparation was performed on the left side as well, prior to placing the screws.  Also of particular note, I did use motor evoked EMG potentials in each of the pedicle screws to confirm that there was no contact with the screws and the nerve roots.  There was no trigger response anything less than 19 mA.  Again, I was very happy with the final appearance of the radiographs and the final decompression.  Also of note, prior to decorticating the posterior elements and the transverse processes and the facet joints, I did liberally irrigated the wound with approximately 2 liters of normal saline.  At this point, a #15 Blake drain was placed.  The fascia was closed using #1 Vicryl.  The subcutaneous layer was closed using 2-0 Vicryl and the skin was closed using 3-0 nylon in a running locking fashion.  Instrument counts were correct at the termination of the procedure but there was apparently 1 extra lap pad noted in the count. An AP radiograph was obtained which confirmed that there was no sponges or instruments left in the wound.  Also of note, Skip Mayer was my assistant throughout the procedure and aided in essential retraction and suctioning required throughout the surgery.     Estill Bamberg, MD     MD/MEDQ  D:  07/02/2011  T:  07/02/2011  Job:  [id number]  cc:   Titus Dubin. Alwyn Ren, MD,FACP,FCCP  Electronically Signed by Estill Bamberg  on 07/12/2011 08:18:32 AM

## 2011-07-13 LAB — GLUCOSE, CAPILLARY: Glucose-Capillary: 105 mg/dL — ABNORMAL HIGH (ref 70–99)

## 2011-07-19 NOTE — Discharge Summary (Signed)
  NAMEDANNA, CASELLA NO.:  [id number]  MEDICAL RECORD NO.:  [id number]  LOCATION:  5035                         FACILITY:  [facility]  PHYSICIAN:  Estill Bamberg, MD      DATE OF BIRTH:  [birth year]  DATE OF ADMISSION:  07/01/2011 DATE OF DISCHARGE:  07/05/2011                              DISCHARGE SUMMARY   ADMISSION DIAGNOSES: 1. Severe spinal stenosis L1-S1. 2. Degenerative lumbar scoliosis.  ADMISSION HISTORY:  Briefly, Mr. Roepke is a pleasant 61 year old male who I have been following for severe debilitating bilateral leg pain in addition to severe weakness.  The patient was noted to have a degenerative scoliosis in addition to spinal stenosis.  The patient was therefore admitted on July 01, 2011, for a decompressive procedure and for a fusion from T10-S1.  HOSPITAL COURSE:  On July 01, 2011, the patient was brought to surgery and underwent the procedure noted above.  The patient tolerated the procedure well and was transferred to recovery in stable condition. The patient was transferred to the floor.  The patient's Foley catheter was discontinued on postoperative day #1 and the patient did ultimately go on to void spontaneously.  Of note, the patient did require Flomax initially in order to void successfully and was able to ultimately have normal bladder function.  The patient did have some discomfort in his back which did improve over time.  The patient's neurovascular status was consistent with the patient's baseline on the morning of postoperative day #1 and throughout his hospital stay.  The patient was evaluated by the Physical Therapy Team and progressively mobilized throughout his hospital stay as well.  It was on postoperative day #4 where it was felt safe to discharge the patient home.  The patient was ultimately discharged home on postoperative day #4.  DISCHARGE INSTRUCTIONS:  The patient's pain medication will be resumed by his  Pain Clinic.  He will adhere to back precautions all times and will specifically avoid any bending, twisting or lifting maneuvers.  He was instructed to remove his dressing on postoperative day #5.  I will see him back in my office in approximately 2 weeks after his procedure for additional evaluation.     Estill Bamberg, MD     MD/MEDQ  D:  07/12/2011  T:  07/12/2011  Job:  [id number]  Electronically Signed by Estill Bamberg  on 07/19/2011 10:40:50 AM

## 2011-07-29 ENCOUNTER — Telehealth: Payer: Self-pay | Admitting: Internal Medicine

## 2011-07-29 NOTE — Telephone Encounter (Signed)
Pt had back surgery He wants to ask what he needs to do about his devices and how it relates to spine surgery

## 2011-07-29 NOTE — Telephone Encounter (Signed)
I spoke with the patient's daughter. She is needing the bone stimulator rep, [employer] rep, and Dr. Ladona Ridgel in the room at the same time to have the bone stimulator tested. The patient currently has a device check with Paula/ Kristin set up for 11/15. I explained to the patient's daughter that Dr. Ladona Ridgel is not in the office that day, and I would need to forward this to [facility] to look at Dr. Lubertha Basque schedule so we can switch his appointment from device clinic to Dr. Ladona Ridgel. I talked with Gunnar Fusi about this and she agrees. The patient's daughter has the number for the bone stimulator rep and will contact her once we give him his appointment with Dr. Ladona Ridgel.

## 2011-07-30 NOTE — Telephone Encounter (Signed)
Will schedule patient to come in on 08/27/11 at 11:30am of Taylor's schedule

## 2011-08-26 ENCOUNTER — Encounter: Payer: Self-pay | Admitting: *Deleted

## 2011-08-26 ENCOUNTER — Encounter: Payer: Medicaid Other | Admitting: *Deleted

## 2011-08-27 ENCOUNTER — Encounter: Payer: Medicaid Other | Admitting: Internal Medicine

## 2011-09-08 ENCOUNTER — Encounter (HOSPITAL_COMMUNITY): Payer: Self-pay | Admitting: *Deleted

## 2011-09-08 ENCOUNTER — Observation Stay (HOSPITAL_COMMUNITY)
Admission: EM | Admit: 2011-09-08 | Discharge: 2011-09-09 | Disposition: A | Discharge status: Home / Self Care | Payer: Medicaid Other | Attending: Emergency Medicine | Admitting: Emergency Medicine

## 2011-09-08 DIAGNOSIS — Z981 Arthrodesis status: Secondary | ICD-10-CM | POA: Insufficient documentation

## 2011-09-08 DIAGNOSIS — R32 Unspecified urinary incontinence: Secondary | ICD-10-CM | POA: Insufficient documentation

## 2011-09-08 DIAGNOSIS — R29898 Other symptoms and signs involving the musculoskeletal system: Secondary | ICD-10-CM | POA: Insufficient documentation

## 2011-09-08 DIAGNOSIS — M51379 Other intervertebral disc degeneration, lumbosacral region without mention of lumbar back pain or lower extremity pain: Secondary | ICD-10-CM | POA: Insufficient documentation

## 2011-09-08 DIAGNOSIS — G8929 Other chronic pain: Principal | ICD-10-CM | POA: Insufficient documentation

## 2011-09-08 DIAGNOSIS — I2510 Atherosclerotic heart disease of native coronary artery without angina pectoris: Secondary | ICD-10-CM | POA: Insufficient documentation

## 2011-09-08 DIAGNOSIS — Z8679 Personal history of other diseases of the circulatory system: Secondary | ICD-10-CM | POA: Insufficient documentation

## 2011-09-08 DIAGNOSIS — M549 Dorsalgia, unspecified: Secondary | ICD-10-CM

## 2011-09-08 DIAGNOSIS — F319 Bipolar disorder, unspecified: Secondary | ICD-10-CM | POA: Insufficient documentation

## 2011-09-08 DIAGNOSIS — M546 Pain in thoracic spine: Secondary | ICD-10-CM | POA: Insufficient documentation

## 2011-09-08 DIAGNOSIS — M545 Low back pain, unspecified: Secondary | ICD-10-CM | POA: Insufficient documentation

## 2011-09-08 DIAGNOSIS — M5137 Other intervertebral disc degeneration, lumbosacral region: Secondary | ICD-10-CM

## 2011-09-08 DIAGNOSIS — M79609 Pain in unspecified limb: Secondary | ICD-10-CM | POA: Insufficient documentation

## 2011-09-08 DIAGNOSIS — E7800 Pure hypercholesterolemia, unspecified: Secondary | ICD-10-CM | POA: Insufficient documentation

## 2011-09-08 DIAGNOSIS — R159 Full incontinence of feces: Secondary | ICD-10-CM | POA: Insufficient documentation

## 2011-09-08 DIAGNOSIS — I10 Essential (primary) hypertension: Secondary | ICD-10-CM | POA: Insufficient documentation

## 2011-09-08 DIAGNOSIS — M4120 Other idiopathic scoliosis, site unspecified: Secondary | ICD-10-CM | POA: Insufficient documentation

## 2011-09-08 DIAGNOSIS — I252 Old myocardial infarction: Secondary | ICD-10-CM | POA: Insufficient documentation

## 2011-09-08 LAB — CBC
MCH: 29.6 pg (ref 26.0–34.0)
MCHC: 33.3 g/dL (ref 30.0–36.0)
Platelets: 246 10*3/uL (ref 150–400)
RBC: 4.23 MIL/uL (ref 4.22–5.81)
RDW: 13.3 % (ref 11.5–15.5)

## 2011-09-08 LAB — BASIC METABOLIC PANEL
CO2: 28 mEq/L (ref 19–32)
Calcium: 9.6 mg/dL (ref 8.4–10.5)
GFR calc Af Amer: >90 mL/min (ref >90)
GFR calc non Af Amer: >90 mL/min (ref >90)
Sodium: 139 mEq/L (ref 135–145)

## 2011-09-08 MED ORDER — OXYMORPHONE HCL ER 10 MG PO T12A
40.0000 mg | EXTENDED_RELEASE_TABLET | ORAL | Status: AC
  Administered 2011-09-08: 40 mg via ORAL
  Filled 2011-09-08: qty 4

## 2011-09-08 MED ORDER — HYDROMORPHONE HCL PF 1 MG/ML IJ SOLN
1.0000 mg | Freq: Once | INTRAMUSCULAR | Status: AC
  Administered 2011-09-08: 1 mg via INTRAVENOUS
  Filled 2011-09-08: qty 1

## 2011-09-08 MED ORDER — OXYCODONE-ACETAMINOPHEN 5-325 MG PO TABS
1.0000 | ORAL_TABLET | Freq: Four times a day (QID) | ORAL | Status: DC | PRN
  Administered 2011-09-08 – 2011-09-09 (×2): 2 via ORAL
  Filled 2011-09-08 (×2): qty 2

## 2011-09-08 MED ORDER — METHYLPREDNISOLONE SODIUM SUCC 125 MG IJ SOLR
125.0000 mg | Freq: Once | INTRAMUSCULAR | Status: AC
  Administered 2011-09-08: 125 mg via INTRAVENOUS
  Filled 2011-09-08: qty 2

## 2011-09-08 MED ORDER — SODIUM CHLORIDE 0.9 % IV SOLN
INTRAVENOUS | Status: DC
  Administered 2011-09-08 – 2011-09-09 (×2): via INTRAVENOUS

## 2011-09-08 NOTE — ED Notes (Signed)
Pt has chronic back problems and has intermittently lost control of bowel and bladder 3x over the past 2 weeks.  Last time this occurred was 3 days ago.  Pt has been having increasing center lower back pain.  His back surgeon advised him to come here for CT

## 2011-09-08 NOTE — ED Notes (Signed)
Paged Dr. Lance Bosch tp [phone]

## 2011-09-08 NOTE — ED Provider Notes (Signed)
Pt awaiting CT myelogram of his back in the morning. Dr. Fredricka Bonine has spoken with Dr. Elita Quick regarding the patient. We are to call him in the morning with results of the test. Pt has had urinary and bowel incontience in the last 12 days. Looking for cord compression. BACK PAIN PROTOCOL.  Dorthula Matas, PA 09/08/11 2239

## 2011-09-09 ENCOUNTER — Emergency Department (HOSPITAL_COMMUNITY): Payer: Medicaid Other

## 2011-09-09 LAB — C-REACTIVE PROTEIN: CRP: 0.16 mg/dL — ABNORMAL LOW (ref <0.60)

## 2011-09-09 LAB — URINALYSIS, ROUTINE W REFLEX MICROSCOPIC
Bilirubin Urine: NEGATIVE
Glucose, UA: NEGATIVE mg/dL
Hgb urine dipstick: NEGATIVE
Ketones, ur: NEGATIVE mg/dL
pH: 6 (ref 5.0–8.0)

## 2011-09-09 LAB — SEDIMENTATION RATE: Sed Rate: 20 mm/hr — ABNORMAL HIGH (ref 0–16)

## 2011-09-09 MED ORDER — HYDROMORPHONE HCL PF 2 MG/ML IJ SOLN
2.0000 mg | Freq: Once | INTRAMUSCULAR | Status: AC
  Administered 2011-09-09: 2 mg via INTRAVENOUS

## 2011-09-09 MED ORDER — OXYMORPHONE HCL ER 40 MG PO TB12
40.0000 mg | ORAL_TABLET | Freq: Two times a day (BID) | ORAL | Status: DC
  Administered 2011-09-09: 40 mg via ORAL
  Filled 2011-09-09: qty 1

## 2011-09-09 MED ORDER — IOHEXOL 300 MG/ML  SOLN
9.0000 mL | Freq: Once | INTRAMUSCULAR | Status: AC | PRN
  Administered 2011-09-09: 9 mL via INTRATHECAL

## 2011-09-09 MED ORDER — ONDANSETRON HCL 4 MG/2ML IJ SOLN: 4.0000 mg | Freq: Four times a day (QID) | INTRAMUSCULAR | Status: DC | PRN

## 2011-09-09 NOTE — ED Notes (Signed)
Pt medicated as requested with opana. He is also requesting his percocet or something else iv for his left side pain and  A headache.. Attempted to explain to pt concern for overmedication but he is not hearing  That. He is agreeable to speaking with the pa

## 2011-09-09 NOTE — ED Notes (Signed)
Pt transported with all personal belongings via stretcher to CDU#4. Pt able to ambulate with no assistance from just outside the room to CDU bed. Report given to CDU RN. Pt AAOx3, RESP E/U, NAD.

## 2011-09-09 NOTE — ED Provider Notes (Signed)
Medical screening examination/treatment/procedure(s) were performed by non-physician practitioner and as supervising physician I was immediately available for consultation/collaboration.   Benny Lennert, MD 09/09/11 1459

## 2011-09-09 NOTE — ED Notes (Signed)
Received call from radilolgy. They are stating pt is upset because he is not getting his pain meds as they are scheduled at home. Pt was made aware and agreeable that the pain meds would be addressed by the time he returned from his procedure. Now in procedure he has changed his mind. Pa made aware and med ordered for pt. Radiology nurse will give med in their dept.

## 2011-09-09 NOTE — ED Provider Notes (Signed)
History     CSN: [id number] Arrival date & time: 09/08/2011  7:15 PM   First MD Initiated Contact with Patient 09/08/11 2156      Chief Complaint  Patient presents with  . Back Pain    (Consider location/radiation/quality/duration/timing/severity/associated sxs/prior treatment) HPI Comments: The patient is a 60 year old male with a history of severe spinal stenosis and scoliosis and chronic back pain who has had a fusion of T10-S1 in the last 2 months to treat symptoms of spinal stenosis and nerve impingement by Dr. Yevette Edwards, who presents for 12 days of worsening lower back pain associated with paraneoplastic cyst the, loss of continence x3, and left lower extremity weakness. He had called Dr. Marshell Levan office and was directed to the ED for further evaluation.  Patient is a 60 y.o. male presenting with back pain. The history is provided by the patient and medical records.  Back Pain  This is a chronic problem. Episode onset: Worsening back pain at the lower back over the previous 12 days, with 3 episodes of urinary and stool incontinence, and left lower extremity weakness. The problem occurs constantly. Progression since onset: Waxing and waning, worsening. The pain is associated with no known injury (The patient has a history of spinal stenosis, severe, and chronic back pain, with a fusion from T10-S1 by Dr. Yevette Edwards approximately 2 months ago). The pain is present in the thoracic spine and lumbar spine. The quality of the pain is described as aching. Radiates to: Perineum(the patient has intact sensation at the perineum, but has dysesthesia at the perineum on the left side) and left lower extremity. The pain is at a severity of 8/10. The pain is severe. The symptoms are aggravated by certain positions, twisting and bending. The pain is the same all the time. Associated symptoms include bowel incontinence, bladder incontinence, leg pain, paresis and weakness. Pertinent negatives include no chest  pain, no fever, no numbness, no weight loss, no headaches, no abdominal pain, no abdominal swelling, no perianal numbness, no dysuria, no paresthesias and no tingling. He has tried analgesics for the symptoms. The treatment provided moderate relief. Risk factors: History of severe spinal stenosis and chronic back pain with prior surgical instrumentation of the thoracic sick and lumbar spine.    Past Medical History  Diagnosis Date  . Coronary artery disease     bypass grafting. Left internal mammary artery to left anterior descending   . Hypertension   . Myocardial infarction     hx of  . Hypercholesteremia   . Periodic limb movement disorder   . Hypersomnia, unspecified   . Syncope and collapse   . Tobacco use disorder   . Degeneration of lumbar or lumbosacral intervertebral disc   . Personal history of other diseases of digestive system   . Cocaine abuse     hx of   . Alcohol abuse     hx of  . Back pain, chronic   . Bipolar disorder, unspecified   . Aortocoronary bypass status 1996    surgery  . Defibrillator activation   . History of ventricular tachycardia      status post automatic implantable cardioverter defibrillator/pacer  . 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 MI (myocardial infarction) 1989 & 2006    with LV dysfunction  . AAA (abdominal aortic aneurysm)   . History of gastrointestinal bleeding     Past Surgical History  Procedure Date  . Aortocoronary byp   .  Icd pla   . Cardiac defibrillator placement 10/23/2007     Medtronic Maximo single chamber cardioverter-defibrillator  . Coronary artery bypass graft 2007    in [place], [place] -- LIMA to LAD, SVG to diagonal, SVG to RCA, SVG to circumflex (saphenous vein graft to circumflex totaled at catheterization in 2006)  . Cardiac catheterization 05/14/2006    Est EF of over 50%  . Cardiac catheterization     Est EF of 50% --   . Cardiac catheterization  09/03/2008    Continued patency of the internal mammary to the LAD -- of the saphenous vein graft to the diagonal -- of the saphenous vein graft to PDA -- Occluded saphenous vein graft to the circumflex with essentially widely patent circumflex vessel  . Lumbar fusion     L5-S1 transforaminal lumbar interbody fusion with contralateral posterolateral fusion at the same level -- Posterolateral fusion T10-11, T11-12, T12-L1, L1-L2, L2-L3, L3-L4, L4-L5 --   . Lumbar laminectomy/decompression microdiscectomy 11/26/2010    Laminectomy with decompression of the spinal cord C3-4, C4-5, C5-6,C6-7    Family History  Problem Relation Age of Onset  . Heart disease Mother 38    deceased  . Heart disease Father 38    deceased  . Heart disease Brother     alive  . Heart attack Mother   . Heart attack Father   . GI problems Sister     bleed    History  Substance Use Topics  . Smoking status: Current Everyday Smoker -- 0.3 packs/day    Types: Cigarettes  . Smokeless tobacco: Not on file   Comment: hx of tobacco abuse greater than 50 packs/years  . Alcohol Use: No      Review of Systems  Constitutional: Negative for fever and weight loss.  HENT: Negative for neck pain and neck stiffness.   Respiratory: Negative for cough, chest tightness and shortness of breath.   Cardiovascular: Negative for chest pain.  Gastrointestinal: Positive for bowel incontinence. Negative for abdominal pain and abdominal distention.  Genitourinary: Positive for bladder incontinence. Negative for dysuria and difficulty urinating.  Musculoskeletal: Positive for back pain. Negative for myalgias, joint swelling and gait problem.  Skin: Negative.   Neurological: Positive for weakness. Negative for tingling, numbness, headaches and paresthesias.  Hematological: Does not bruise/bleed easily.  Psychiatric/Behavioral: Negative.     Allergies  Amitriptyline hcl; Codeine; Iohexol; and Nifedipine  Home Medications    Current Outpatient Rx  Name Route Sig Dispense Refill  . ALBUTEROL SULFATE (2.5 MG/3ML) 0.083% IN NEBU Nebulization Take 2.5 mg by nebulization every 6 (six) hours as needed. For shortness of breath.    . ASPIRIN 81 MG PO TABS Oral Take 81 mg by mouth daily.      . BECLOMETHASONE DIPROPIONATE 40 MCG/ACT IN AERS Inhalation Inhale 2 puffs into the lungs 2 (two) times daily.      . CYCLOBENZAPRINE HCL 10 MG PO TABS Oral Take 10 mg by mouth 3 (three) times daily as needed. For muscle spasms.    Marland Kitchen DIAZEPAM 10 MG PO TABS Oral Take 10 mg by mouth 2 (two) times daily.      Marland Kitchen LISINOPRIL 10 MG PO TABS Oral Take 10 mg by mouth daily.      Marland Kitchen METOPROLOL TARTRATE 50 MG PO TABS Oral Take 50 mg by mouth 2 (two) times daily.      Marland Kitchen NITROGLYCERIN 0.4 MG SL SUBL Sublingual Place 0.4 mg under the tongue every 5 (five) minutes as  needed. For chest pain.    Marland Kitchen OMEPRAZOLE 20 MG PO CPDR Oral Take 20 mg by mouth daily.      . OXYCODONE-ACETAMINOPHEN 7.5-325 MG PO TABS Oral Take 2 tablets by mouth every 4 (four) hours as needed. For pain.    Marland Kitchen SIMVASTATIN 20 MG PO TABS Oral Take 20 mg by mouth at bedtime.      Marland Kitchen TIOTROPIUM BROMIDE MONOHYDRATE 18 MCG IN CAPS Inhalation Place 18 mcg into inhaler and inhale daily.        BP 145/81  Pulse 84  Temp(Src) 97.3 F (36.3 C) (Oral)  Resp 18  SpO2 99%  Physical Exam  Nursing note and vitals reviewed. Constitutional: He is oriented to person, place, and time. He appears well-developed and well-nourished. He appears distressed.  HENT:  Head: Normocephalic and atraumatic.  Mouth/Throat: Oropharynx is clear and moist.  Eyes: EOM are normal. Pupils are equal, round, and reactive to light.  Neck: Trachea normal, normal range of motion, full passive range of motion without pain and phonation normal. Neck supple. No spinous process tenderness and no muscular tenderness present. No rigidity. Normal range of motion present.  Cardiovascular: Normal rate, regular rhythm, normal  heart sounds and intact distal pulses.  Exam reveals no gallop and no friction rub.   No murmur heard. Pulmonary/Chest: Effort normal and breath sounds normal. No respiratory distress. He has no wheezes. He has no rales. He exhibits no tenderness.  Abdominal: Soft. Bowel sounds are normal. He exhibits no distension. There is no tenderness. There is no rebound and no guarding.  Genitourinary: Rectal exam shows anal tone abnormal. Rectal exam shows no external hemorrhoid, no internal hemorrhoid, no fissure, no mass and no tenderness.        The patient has anal tone present, however on clenching his sphincter muscle he does not have as much strength as would be expected there. Perineal sensation is intact, but with dysesthesia at the left perineum.  Musculoskeletal: He exhibits tenderness. He exhibits no edema.       Cervical back: Normal.       Thoracic back: Normal.       Lumbar back: He exhibits decreased range of motion, tenderness, bony tenderness, pain and spasm. He exhibits no swelling, no edema, no deformity and no laceration.  Neurological: He is alert and oriented to person, place, and time. He displays tremor. No cranial nerve deficit or sensory deficit. He exhibits abnormal muscle tone. Coordination normal. GCS eye subscore is 4. GCS verbal subscore is 5. GCS motor subscore is 6.  Reflex Scores:      Patellar reflexes are 2+ on the right side and 3+ on the left side.      Achilles reflexes are 3+ on the left side.      The patient has 4 of 5 strength at left hip extension, left knee flexion and extension, but otherwise shows 5 of 5 strength throughout the lower extremities.  Skin: He is not diaphoretic.    ED Course  Procedures (including critical care time)  Labs Reviewed  CBC - Abnormal; Notable for the following:    Hemoglobin 12.5 (*)    HCT 37.5 (*)    All other components within normal limits  SEDIMENTATION RATE - Abnormal; Notable for the following:    Sed Rate 20 (*)     All other components within normal limits  BASIC METABOLIC PANEL  PROTIME-INR  APTT  C-REACTIVE PROTEIN  URINALYSIS, ROUTINE W REFLEX MICROSCOPIC   No  results found.   1. BACK PAIN, CHRONIC   2. DEGENERATIVE DISC DISEASE, LUMBAR SPINE       MDM  I spoke with Dr. Yevette Edwards by telephone and discussed the patient's findings and presentation. I also called Dr. Fredia Sorrow of radiology to discuss imaging modalities for the patient as he has a pacemaker in place, and with the recent hardware placed in his lumbar spine, MRI as neither safe nor would it be expected to yield usable images due to artifact. In discussion with the other 2 aforementioned physicians, I have decided to place the patient in the CDU overnight on the back pain protocol to get pain medication as needed, while awaiting a morning CT myelogram. The patient is aware of the plan of care and states his understanding of and agreement with that. I have moved the patient to the CDU under the aforementioned order set under the care of Ellin Saba PA, and Dr. Azalia Bilis overnight tonight. My concern for the patient is that he has possibly some recurrent spinal stenosis causing nerve impingement or myelopathy. I have treated him with IV steroids to reduce any inflammation in the area, and had gotten him comfortable on pain medications. In the morning, the a.m. the CDU providers will await the results of the CT myelogram and contact Dr. Yevette Edwards or his on-call counterpart to discuss the results.  Evaluation and management procedures were performed by me prior to passing care of the patient to the mid-level provider in the CDU. Patient management continued under my collaboration and supervision as performed by the mid-level provider.         Felisa Bonier, MD 09/09/11 [phone]

## 2011-09-09 NOTE — ED Provider Notes (Signed)
Patient with history of chronic back pain is presenting to the ED with intermittent urinary and bowel incontinence. He has been placed in the CDU under a back pain protocol. He is to be followed by Dr. Yevette Edwards.  He is currently awaits CT myelogram of his back as he is not a candidate for an MRI due to to having a pacemaker.  12:03 PM CT myelogram of the thoracic and lumbar region reveals multiple smaller disc protrusions but no obvious cord compression. Reassurance given to the patient. Patient has agreed to follow with Dr. Maisie Fus the for further management. Patient was instructed to lay flat after having CT myelogram. Patient has been following instructions for the past several hours, however he requests to be discharged. Patient agrees to go home and laid down. He is currently in no acute distress.  Fayrene Helper, PA 09/09/11 1335

## 2011-09-09 NOTE — ED Notes (Signed)
Patient currently coughing and moving around, will re check blood pressure.

## 2011-09-09 NOTE — ED Notes (Signed)
Pa  Aware pt would like to speak with him about pain management. Sprite to pt . Meal ordered for pt at his request

## 2011-09-09 NOTE — Procedures (Signed)
L2-3 lp with 22g Clear csf 9 cc Omnippaque 300 instilled Thoracic and Lumbar myelogram performed No blood loss No complications

## 2011-09-09 NOTE — ED Notes (Signed)
Pt has returned from myelogram . He is supine in bed with pillow under his head.

## 2011-09-09 NOTE — ED Notes (Signed)
Pa aware of pt request for opana and will  Address this request

## 2011-09-13 NOTE — ED Provider Notes (Signed)
Evaluation and management procedures were performed by me prior to passing care of the patient to the mid-level provider in the CDU. Patient management continued under my collaboration and supervision as performed by the mid-level provider.   Felisa Bonier, MD 09/13/11 [phone]

## 2011-09-16 NOTE — Progress Notes (Signed)
Observation review completed. 

## 2011-09-27 ENCOUNTER — Ambulatory Visit (INDEPENDENT_AMBULATORY_CARE_PROVIDER_SITE_OTHER): Payer: Medicaid Other | Admitting: *Deleted

## 2011-09-27 ENCOUNTER — Encounter: Payer: Self-pay | Admitting: Internal Medicine

## 2011-09-27 DIAGNOSIS — R55 Syncope and collapse: Secondary | ICD-10-CM

## 2011-09-27 LAB — ICD DEVICE OBSERVATION
BRDY-0002RV: 40 {beats}/min
CHARGE TIME: 8.34 s
DEV-0020ICD: NEGATIVE
RV LEAD AMPLITUDE: 1.7 mv
RV LEAD IMPEDENCE ICD: 472 Ohm
RV LEAD THRESHOLD: 1.5 V
TZAT-0001FASTVT: 1
TZAT-0001SLOWVT: 1
TZAT-0001SLOWVT: 2
TZAT-0002FASTVT: NEGATIVE
TZAT-0002SLOWVT: NEGATIVE
TZAT-0002SLOWVT: NEGATIVE
TZAT-0004FASTVT: 8
TZAT-0011SLOWVT: 10 ms
TZAT-0011SLOWVT: 10 ms
TZAT-0018SLOWVT: NEGATIVE
TZAT-0018SLOWVT: NEGATIVE
TZAT-0019FASTVT: 8 V
TZAT-0019SLOWVT: 8 V
TZAT-0019SLOWVT: 8 V
TZAT-0020FASTVT: 1.6 ms
TZAT-0020SLOWVT: 1.6 ms
TZAT-0020SLOWVT: 1.6 ms
TZON-0005SLOWVT: 28
TZON-0008SLOWVT: 0 ms
TZON-0010AFLUTTER: 50 ms
TZON-0010FASTVT: 50 ms
TZON-0010VSLOWVT: 50 ms
TZST-0001FASTVT: 2
TZST-0001FASTVT: 4
TZST-0001FASTVT: 6
TZST-0001SLOWVT: 3
TZST-0001SLOWVT: 4
TZST-0001SLOWVT: 5
TZST-0002FASTVT: NEGATIVE
TZST-0002FASTVT: NEGATIVE
TZST-0002SLOWVT: NEGATIVE
TZST-0002SLOWVT: NEGATIVE
TZST-0002SLOWVT: NEGATIVE
TZST-0003FASTVT: 25 J
TZST-0003FASTVT: 35 J
TZST-0003SLOWVT: 25 J
TZST-0003SLOWVT: 35 J
VF: 0

## 2011-09-27 NOTE — Progress Notes (Signed)
icd check in clinic  

## 2011-11-05 ENCOUNTER — Encounter (HOSPITAL_COMMUNITY): Payer: Self-pay | Admitting: *Deleted

## 2011-11-05 ENCOUNTER — Emergency Department (HOSPITAL_COMMUNITY)
Admission: EM | Admit: 2011-11-05 | Discharge: 2011-11-05 | Disposition: A | Discharge status: Home / Self Care | Payer: Medicaid Other | Attending: Emergency Medicine | Admitting: Emergency Medicine

## 2011-11-05 DIAGNOSIS — G8929 Other chronic pain: Secondary | ICD-10-CM | POA: Insufficient documentation

## 2011-11-05 DIAGNOSIS — Z8673 Personal history of transient ischemic attack (TIA), and cerebral infarction without residual deficits: Secondary | ICD-10-CM | POA: Insufficient documentation

## 2011-11-05 DIAGNOSIS — M545 Low back pain, unspecified: Secondary | ICD-10-CM | POA: Insufficient documentation

## 2011-11-05 DIAGNOSIS — F17200 Nicotine dependence, unspecified, uncomplicated: Secondary | ICD-10-CM | POA: Insufficient documentation

## 2011-11-05 DIAGNOSIS — Z981 Arthrodesis status: Secondary | ICD-10-CM | POA: Insufficient documentation

## 2011-11-05 DIAGNOSIS — I252 Old myocardial infarction: Secondary | ICD-10-CM | POA: Insufficient documentation

## 2011-11-05 DIAGNOSIS — R252 Cramp and spasm: Secondary | ICD-10-CM | POA: Insufficient documentation

## 2011-11-05 DIAGNOSIS — E7800 Pure hypercholesterolemia, unspecified: Secondary | ICD-10-CM | POA: Insufficient documentation

## 2011-11-05 DIAGNOSIS — M25559 Pain in unspecified hip: Secondary | ICD-10-CM | POA: Insufficient documentation

## 2011-11-05 DIAGNOSIS — I2510 Atherosclerotic heart disease of native coronary artery without angina pectoris: Secondary | ICD-10-CM | POA: Insufficient documentation

## 2011-11-05 DIAGNOSIS — M549 Dorsalgia, unspecified: Secondary | ICD-10-CM

## 2011-11-05 DIAGNOSIS — M62838 Other muscle spasm: Secondary | ICD-10-CM | POA: Insufficient documentation

## 2011-11-05 DIAGNOSIS — F319 Bipolar disorder, unspecified: Secondary | ICD-10-CM | POA: Insufficient documentation

## 2011-11-05 DIAGNOSIS — M79609 Pain in unspecified limb: Secondary | ICD-10-CM | POA: Insufficient documentation

## 2011-11-05 DIAGNOSIS — IMO0002 Reserved for concepts with insufficient information to code with codable children: Secondary | ICD-10-CM | POA: Insufficient documentation

## 2011-11-05 DIAGNOSIS — Z79899 Other long term (current) drug therapy: Secondary | ICD-10-CM | POA: Insufficient documentation

## 2011-11-05 DIAGNOSIS — Z7982 Long term (current) use of aspirin: Secondary | ICD-10-CM | POA: Insufficient documentation

## 2011-11-05 DIAGNOSIS — Z951 Presence of aortocoronary bypass graft: Secondary | ICD-10-CM | POA: Insufficient documentation

## 2011-11-05 DIAGNOSIS — I10 Essential (primary) hypertension: Secondary | ICD-10-CM

## 2011-11-05 DIAGNOSIS — R209 Unspecified disturbances of skin sensation: Secondary | ICD-10-CM | POA: Insufficient documentation

## 2011-11-05 LAB — BASIC METABOLIC PANEL
BUN: 12 mg/dL (ref 6–23)
CO2: 25 mEq/L (ref 19–32)
Calcium: 10.1 mg/dL (ref 8.4–10.5)
GFR calc non Af Amer: >90 mL/min (ref >90)
Glucose, Bld: 103 mg/dL — ABNORMAL HIGH (ref 70–99)
Sodium: 139 mEq/L (ref 135–145)

## 2011-11-05 MED ORDER — HYDROMORPHONE HCL PF 2 MG/ML IJ SOLN
2.0000 mg | Freq: Once | INTRAMUSCULAR | Status: AC
  Administered 2011-11-05: 2 mg via INTRAMUSCULAR
  Filled 2011-11-05: qty 1

## 2011-11-05 MED ORDER — FENTANYL 12 MCG/HR TD PT72
12.5000 ug | MEDICATED_PATCH | TRANSDERMAL | Status: DC
  Administered 2011-11-05 (×2): 12.5 ug via TRANSDERMAL
  Filled 2011-11-05: qty 1

## 2011-11-05 MED ORDER — DIAZEPAM 5 MG PO TABS
5.0000 mg | ORAL_TABLET | Freq: Once | ORAL | Status: AC
  Administered 2011-11-05: 5 mg via ORAL
  Filled 2011-11-05: qty 1

## 2011-11-05 NOTE — ED Notes (Signed)
Pt states he fell yesterday several times. Pt c/o weakness in bilateral lower ext

## 2011-11-05 NOTE — ED Provider Notes (Signed)
History     CSN: [id number]  Arrival date & time 11/05/11  1320   First MD Initiated Contact with Patient 11/05/11 1407      Chief Complaint  Patient presents with  . Weakness    pt reports numbness in lower back since back operation, states that today his legs gave out on him. states he called PCP and was told to come to ER for eval.     (Consider location/radiation/quality/duration/timing/severity/associated sxs/prior treatment) HPI  Patient has history of chronic back pain that has been followed by a pain management clinic, [facility], as well as his orthopedic, Dr. Yevette Edwards, presents to emergency department complaining of a one-week history of increasing lower back/bilateral hip pain as well as bilateral posterior thigh cramping with left posterior thigh cramping worse than right. Patient states that over the last week he has had gradual increasing pain in his lower back as well as his legs with intermittent falls over last 2 to three-day stating "pain in my legs hurt so much that we'll just give out." Patient denies hitting head or loss of consciousness with the falls. Patient denies any extremity numbness or tingling, loss of bowel or bladder function, or saddle seat paresthesias. Patient denies any recent injury. He denies any skin changes, erythema, heat, or swelling of his lower extremities. Patient states after being seen in the emergency department in November of 2012 he started with a pain management clinic and has had well-controlled with his pain with daily narcotic pain medicine use until this last week. Patient has history of high cholesterol and high blood pressure for which he takes medication. Patient states he uses a power wheelchair most of the time however occasionally uses a walker for assistance in ambulation. Patient states pain is aggravated by movement and mildly improved with his daily narcotic prescriptions however pain recurs. Patient states he spoke  with the pain management clinic who will see him on Tuesday, 4 days from now but told him that if the pain was so severe he needed to go to the ER for further evaluation. Patient states he is currently on a slow release hydromorphone tablet and an instant release oxycodone pill daily.   Past Medical History  Diagnosis Date  . Coronary artery disease     bypass grafting. Left internal mammary artery to left anterior descending   . Hypertension   . Myocardial infarction     hx of  . Hypercholesteremia   . Periodic limb movement disorder   . Hypersomnia, unspecified   . Syncope and collapse   . Tobacco use disorder   . Degeneration of lumbar or lumbosacral intervertebral disc   . Personal history of other diseases of digestive system   . Cocaine abuse     hx of   . Alcohol abuse     hx of  . Back pain, chronic   . Bipolar disorder, unspecified   . Aortocoronary bypass status 1996    surgery  . Defibrillator activation   . History of ventricular tachycardia      status post automatic implantable cardioverter defibrillator/pacer  . 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 MI (myocardial infarction) 1989 & 2006    with LV dysfunction  . AAA (abdominal aortic aneurysm)   . History of gastrointestinal bleeding     Past Surgical History  Procedure Date  . Aortocoronary byp   . Icd pla   . Cardiac  defibrillator placement 10/23/2007     Medtronic Maximo single chamber cardioverter-defibrillator  . Coronary artery bypass graft 2007    in [place], [place] -- LIMA to LAD, SVG to diagonal, SVG to RCA, SVG to circumflex (saphenous vein graft to circumflex totaled at catheterization in 2006)  . Cardiac catheterization 05/14/2006    Est EF of over 50%  . Cardiac catheterization     Est EF of 50% --   . Cardiac catheterization 09/03/2008    Continued patency of the internal mammary to the LAD -- of the saphenous vein graft to the  diagonal -- of the saphenous vein graft to PDA -- Occluded saphenous vein graft to the circumflex with essentially widely patent circumflex vessel  . Lumbar fusion     L5-S1 transforaminal lumbar interbody fusion with contralateral posterolateral fusion at the same level -- Posterolateral fusion T10-11, T11-12, T12-L1, L1-L2, L2-L3, L3-L4, L4-L5 --   . Lumbar laminectomy/decompression microdiscectomy 11/26/2010    Laminectomy with decompression of the spinal cord C3-4, C4-5, C5-6,C6-7    Family History  Problem Relation Age of Onset  . Heart disease Mother 39    deceased  . Heart disease Father 6    deceased  . Heart disease Brother     alive  . Heart attack Mother   . Heart attack Father   . GI problems Sister     bleed    History  Substance Use Topics  . Smoking status: Current Everyday Smoker -- 0.3 packs/day    Types: Cigarettes  . Smokeless tobacco: Not on file   Comment: hx of tobacco abuse greater than 50 packs/years  . Alcohol Use: No      Review of Systems  All other systems reviewed and are negative.    Allergies  Amitriptyline hcl; Codeine; Iohexol; Ivp dye; and Nifedipine  Home Medications   Current Outpatient Rx  Name Route Sig Dispense Refill  . ALBUTEROL SULFATE (2.5 MG/3ML) 0.083% IN NEBU Nebulization Take 2.5 mg by nebulization every 6 (six) hours as needed. For shortness of breath.    . ASPIRIN 81 MG PO TABS Oral Take 81 mg by mouth daily.      . BECLOMETHASONE DIPROPIONATE 40 MCG/ACT IN AERS Inhalation Inhale 2 puffs into the lungs 2 (two) times daily.      . CYCLOBENZAPRINE HCL 10 MG PO TABS Oral Take 10 mg by mouth 3 (three) times daily as needed. For muscle spasms.    Marland Kitchen DIAZEPAM 10 MG PO TABS Oral Take 10 mg by mouth 2 (two) times daily.      Marland Kitchen HYDROMORPHONE HCL ER 16 MG PO TB24 Oral Take 2 tablets by mouth daily.    Marland Kitchen LISINOPRIL 10 MG PO TABS Oral Take 10 mg by mouth daily.      Marland Kitchen METOPROLOL TARTRATE 50 MG PO TABS Oral Take 50 mg by mouth 2  (two) times daily.      Marland Kitchen NITROGLYCERIN 0.4 MG SL SUBL Sublingual Place 0.4 mg under the tongue every 5 (five) minutes x 3 doses as needed. For chest pain.    Marland Kitchen OMEPRAZOLE 20 MG PO CPDR Oral Take 20 mg by mouth daily.      . OXYCODONE HCL 15 MG PO TABS Oral Take 15 mg by mouth every 4 (four) hours as needed. For pain.    Marland Kitchen SIMVASTATIN 20 MG PO TABS Oral Take 20 mg by mouth at bedtime.      Marland Kitchen TIOTROPIUM BROMIDE MONOHYDRATE 18 MCG IN CAPS  Inhalation Place 18 mcg into inhaler and inhale daily.        BP 178/117  Pulse 74  Temp(Src) 97.6 F (36.4 C) (Oral)  Resp 20  Wt 150 lb (68.04 kg)  SpO2 100%  Physical Exam  Nursing note and vitals reviewed. Constitutional: He is oriented to person, place, and time. He appears well-developed and well-nourished. No distress.  HENT:  Head: Normocephalic and atraumatic.  Eyes: Conjunctivae are normal.  Neck: Normal range of motion. Neck supple.  Cardiovascular: Normal rate, regular rhythm, normal heart sounds and intact distal pulses.  Exam reveals no gallop and no friction rub.   No murmur heard. Pulmonary/Chest: Effort normal and breath sounds normal. No respiratory distress. He has no wheezes. He has no rales. He exhibits no tenderness.  Abdominal: Bowel sounds are normal. He exhibits no distension and no mass. There is no tenderness. There is no rebound and no guarding.  Musculoskeletal: Normal range of motion. He exhibits tenderness. He exhibits no edema.       TTP of bilateral hips with FROM from hips with pain in left hip. TTP of bilateral posterior thighs without redness, skin changes, or crepitous. Left thigh with greater TTP. No TTP of bilateral calf. Good femoral pulse. Normal reflexes of bilateral LE.   Neurological: He is alert and oriented to person, place, and time. He has normal reflexes.  Skin: Skin is warm and dry. No rash noted. He is not diaphoretic. No erythema.  Psychiatric: He has a normal mood and affect.    ED Course    Procedures (including critical care time)  IM dilaudid. Transdermal fentanyl.  Labs Reviewed  BASIC METABOLIC PANEL - Abnormal; Notable for the following:    Glucose, Bld 103 (*)    All other components within normal limits  CK   No results found.   1. BACK PAIN, CHRONIC   2. Muscle spasm   3. Hypertension       MDM  Patient's potassium as well as CK are normal without evidence for the origin of bilateral thigh cramping. Bilateral lower chimneys are neurovascularly intact with no signs symptoms of central cord compression or cauda equina. Patient had CT myelogram in November for chronic back pain without acute findings. Patient has an Mining engineer wheelchair at home for assistance in mobility and has a followup appointment with his pain management on Tuesday, 4 days from now. Abdomen soft and nontender. He is afebrile in no acute distress.        Jenness [place], [place] 11/05/11 [phone]

## 2011-11-05 NOTE — ED Provider Notes (Signed)
Medical screening examination/treatment/procedure(s) were performed by non-physician practitioner and as supervising physician I was immediately available for consultation/collaboration.  Neftali Abair P Orel Cooler, MD 11/05/11 1527 

## 2011-11-07 ENCOUNTER — Emergency Department (HOSPITAL_COMMUNITY)
Admission: EM | Admit: 2011-11-07 | Discharge: 2011-11-07 | Disposition: A | Discharge status: Home / Self Care | Payer: Medicaid Other | Attending: Emergency Medicine | Admitting: Emergency Medicine

## 2011-11-07 ENCOUNTER — Encounter (HOSPITAL_COMMUNITY): Payer: Self-pay | Admitting: Emergency Medicine

## 2011-11-07 DIAGNOSIS — F319 Bipolar disorder, unspecified: Secondary | ICD-10-CM | POA: Insufficient documentation

## 2011-11-07 DIAGNOSIS — I10 Essential (primary) hypertension: Secondary | ICD-10-CM | POA: Insufficient documentation

## 2011-11-07 DIAGNOSIS — I252 Old myocardial infarction: Secondary | ICD-10-CM | POA: Insufficient documentation

## 2011-11-07 DIAGNOSIS — M545 Low back pain, unspecified: Secondary | ICD-10-CM | POA: Insufficient documentation

## 2011-11-07 DIAGNOSIS — Z7982 Long term (current) use of aspirin: Secondary | ICD-10-CM | POA: Insufficient documentation

## 2011-11-07 DIAGNOSIS — G8929 Other chronic pain: Secondary | ICD-10-CM | POA: Insufficient documentation

## 2011-11-07 DIAGNOSIS — Z8673 Personal history of transient ischemic attack (TIA), and cerebral infarction without residual deficits: Secondary | ICD-10-CM | POA: Insufficient documentation

## 2011-11-07 DIAGNOSIS — Z79899 Other long term (current) drug therapy: Secondary | ICD-10-CM | POA: Insufficient documentation

## 2011-11-07 DIAGNOSIS — I2510 Atherosclerotic heart disease of native coronary artery without angina pectoris: Secondary | ICD-10-CM | POA: Insufficient documentation

## 2011-11-07 DIAGNOSIS — R209 Unspecified disturbances of skin sensation: Secondary | ICD-10-CM | POA: Insufficient documentation

## 2011-11-07 DIAGNOSIS — E7800 Pure hypercholesterolemia, unspecified: Secondary | ICD-10-CM | POA: Insufficient documentation

## 2011-11-07 DIAGNOSIS — R29898 Other symptoms and signs involving the musculoskeletal system: Secondary | ICD-10-CM | POA: Insufficient documentation

## 2011-11-07 DIAGNOSIS — M549 Dorsalgia, unspecified: Secondary | ICD-10-CM

## 2011-11-07 MED ORDER — HYDROMORPHONE HCL PF 1 MG/ML IJ SOLN
1.0000 mg | Freq: Once | INTRAMUSCULAR | Status: AC
  Administered 2011-11-07: 1 mg via INTRAMUSCULAR
  Filled 2011-11-07: qty 1

## 2011-11-07 NOTE — ED Notes (Signed)
Pt requesting to see the MD.explained to him that he needed to follow up with the Pain Clinic per Dr. Denton Lank.  Pt requesting to know how many mg of dilaudid he received.  St's well I will just come back because this isn't gonna work.  Pt was sleeping prior to being discharged.

## 2011-11-07 NOTE — ED Notes (Signed)
C/o bilateral leg weakness that has been getting worse over the past 2-3 weeks.  Also c/o pain to bilateral feet and knees (L>R).  Pt has had 2 surgeries in the last 7 months on neck and back.

## 2011-11-07 NOTE — ED Notes (Signed)
Pt c/o bil leg pain st's pain got worse when he laid down to take a nap.    Pt sleeping prior to being assessed.

## 2011-11-07 NOTE — ED Provider Notes (Signed)
History     CSN: [id number]  Arrival date & time 11/07/11  1550   First MD Initiated Contact with Patient 11/07/11 1646      Chief Complaint  Patient presents with  . Extremity Weakness    (Consider location/radiation/quality/duration/timing/severity/associated sxs/prior treatment) Patient is a 61 y.o. male presenting with extremity weakness. The history is provided by the patient.  Extremity Weakness Pertinent negatives include no chest pain, no abdominal pain, no headaches and no shortness of breath.  pt c/o chronic low back pain. Constant, dull, non radiating, lower back. States the pain is so bad at times, with certain positions, that it makes his legs want to give way.  States occasionally his leg will feel tingly, no constant/persistent extremity numbness or weakness. No perineal numbness. No urinary retention or incontinence. No fever or chills. Denies recent trauma or fall.  No acute or abrupt change in symptoms, but persistent of same symptoms. Pt also states he inadvertently washed off a fentanyl patch that he had.  Past Medical History  Diagnosis Date  . Coronary artery disease     bypass grafting. Left internal mammary artery to left anterior descending   . Hypertension   . Myocardial infarction     hx of  . Hypercholesteremia   . Periodic limb movement disorder   . Hypersomnia, unspecified   . Syncope and collapse   . Tobacco use disorder   . Degeneration of lumbar or lumbosacral intervertebral disc   . Personal history of other diseases of digestive system   . Cocaine abuse     hx of   . Alcohol abuse     hx of  . Back pain, chronic   . Bipolar disorder, unspecified   . Aortocoronary bypass status 1996    surgery  . Defibrillator activation   . History of ventricular tachycardia      status post automatic implantable cardioverter defibrillator/pacer  . 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 MI (myocardial  infarction) 1989 & 2006    with LV dysfunction  . AAA (abdominal aortic aneurysm)   . History of gastrointestinal bleeding     Past Surgical History  Procedure Date  . Aortocoronary byp   . Icd pla   . Cardiac defibrillator placement 10/23/2007     Medtronic Maximo single chamber cardioverter-defibrillator  . Coronary artery bypass graft 2007    in [place], [place] -- LIMA to LAD, SVG to diagonal, SVG to RCA, SVG to circumflex (saphenous vein graft to circumflex totaled at catheterization in 2006)  . Cardiac catheterization 05/14/2006    Est EF of over 50%  . Cardiac catheterization     Est EF of 50% --   . Cardiac catheterization 09/03/2008    Continued patency of the internal mammary to the LAD -- of the saphenous vein graft to the diagonal -- of the saphenous vein graft to PDA -- Occluded saphenous vein graft to the circumflex with essentially widely patent circumflex vessel  . Lumbar fusion     L5-S1 transforaminal lumbar interbody fusion with contralateral posterolateral fusion at the same level -- Posterolateral fusion T10-11, T11-12, T12-L1, L1-L2, L2-L3, L3-L4, L4-L5 --   . Lumbar laminectomy/decompression microdiscectomy 11/26/2010    Laminectomy with decompression of the spinal cord C3-4, C4-5, C5-6,C6-7    Family History  Problem Relation Age of Onset  . Heart disease Mother 59    deceased  . Heart disease Father 28  deceased  . Heart disease Brother     alive  . Heart attack Mother   . Heart attack Father   . GI problems Sister     bleed    History  Substance Use Topics  . Smoking status: Current Everyday Smoker -- 0.3 packs/day    Types: Cigarettes  . Smokeless tobacco: Not on file   Comment: hx of tobacco abuse greater than 50 packs/years  . Alcohol Use: No      Review of Systems  Constitutional: Negative for fever and chills.  HENT: Negative for neck pain.   Eyes: Negative for redness.  Respiratory: Negative for shortness of breath.     Cardiovascular: Negative for chest pain.  Gastrointestinal: Negative for abdominal pain.  Genitourinary: Negative for flank pain.  Musculoskeletal: Positive for back pain and extremity weakness.  Skin: Negative for rash.  Neurological: Negative for weakness and headaches.  Hematological: Does not bruise/bleed easily.  Psychiatric/Behavioral: Negative for confusion.    Allergies  Amitriptyline hcl; Codeine; Iohexol; Ivp dye; and Nifedipine  Home Medications   Current Outpatient Rx  Name Route Sig Dispense Refill  . ALBUTEROL SULFATE (2.5 MG/3ML) 0.083% IN NEBU Nebulization Take 2.5 mg by nebulization every 6 (six) hours as needed. For shortness of breath.    . ASPIRIN 81 MG PO TABS Oral Take 81 mg by mouth daily.      . BECLOMETHASONE DIPROPIONATE 40 MCG/ACT IN AERS Inhalation Inhale 2 puffs into the lungs 2 (two) times daily.      . CYCLOBENZAPRINE HCL 10 MG PO TABS Oral Take 10 mg by mouth 3 (three) times daily as needed. For muscle spasms.    Marland Kitchen DIAZEPAM 10 MG PO TABS Oral Take 10 mg by mouth 2 (two) times daily.      Marland Kitchen HYDROMORPHONE HCL ER 16 MG PO TB24 Oral Take 2 tablets by mouth daily.    Marland Kitchen LISINOPRIL 10 MG PO TABS Oral Take 10 mg by mouth daily.      Marland Kitchen METOPROLOL TARTRATE 50 MG PO TABS Oral Take 50 mg by mouth 2 (two) times daily.      Marland Kitchen NITROGLYCERIN 0.4 MG SL SUBL Sublingual Place 0.4 mg under the tongue every 5 (five) minutes x 3 doses as needed. For chest pain.    Marland Kitchen OMEPRAZOLE 20 MG PO CPDR Oral Take 20 mg by mouth daily.      . OXYCODONE HCL 15 MG PO TABS Oral Take 15 mg by mouth every 4 (four) hours as needed. For pain.    Marland Kitchen SIMVASTATIN 20 MG PO TABS Oral Take 20 mg by mouth at bedtime.      Marland Kitchen TIOTROPIUM BROMIDE MONOHYDRATE 18 MCG IN CAPS Inhalation Place 18 mcg into inhaler and inhale daily.        BP 142/87  Pulse 84  Temp(Src) 98 F (36.7 C) (Oral)  Resp 18  SpO2 97%  Physical Exam  Nursing note and vitals reviewed. Constitutional: He is oriented to person,  place, and time. He appears well-developed and well-nourished. No distress.  HENT:  Head: Atraumatic.  Eyes: Pupils are equal, round, and reactive to light.  Neck: Neck supple. No tracheal deviation present.  Cardiovascular: Normal rate.   Pulmonary/Chest: Effort normal. No accessory muscle usage. No respiratory distress.  Abdominal: He exhibits no distension.  Musculoskeletal: Normal range of motion. He exhibits no edema and no tenderness.       Spine non tender, aligned, no step off. Well healed surgical scar. Lumbar muscular  tenderness.   Neurological: He is alert and oriented to person, place, and time. He displays normal reflexes.       Straight leg raise neg. Symmetric reflexes. Motor intact bil.   Skin: Skin is warm and dry.    ED Course  Procedures (including critical care time)     MDM  Reviewed prior charts. Multiple prior evals for same. Pt denies acute injury or new numbness/weakness. States was supposed to go to pain specialist 2 days ago 'but were closed'. Has appt w his doctor this coming week. Reviewed ct myelogram for same symptoms 11/12, neg acute.   Dilaudid 1 mg im. Discussed importance close follow up with his doctor/specialist.   Recheck pt comfortable.         Suzi Roots, MD 11/07/11 1736

## 2011-11-08 NOTE — ED Provider Notes (Signed)
Medical screening examination/treatment/procedure(s) were performed by non-physician practitioner and as supervising physician I was immediately available for consultation/collaboration.  Niang Mitcheltree P Lakeyshia Tuckerman, MD 11/08/11 0705 

## 2011-11-10 ENCOUNTER — Ambulatory Visit: Payer: Medicaid Other | Admitting: Physical Therapy

## 2011-11-20 ENCOUNTER — Inpatient Hospital Stay (HOSPITAL_COMMUNITY)
Admission: EM | Admit: 2011-11-20 | Discharge: 2011-11-23 | DRG: 093 | Disposition: A | Discharge status: Home / Self Care | Payer: Medicaid Other | Attending: Internal Medicine | Admitting: Internal Medicine

## 2011-11-20 ENCOUNTER — Encounter (HOSPITAL_COMMUNITY): Payer: Self-pay | Admitting: Emergency Medicine

## 2011-11-20 ENCOUNTER — Other Ambulatory Visit: Payer: Self-pay

## 2011-11-20 DIAGNOSIS — I2510 Atherosclerotic heart disease of native coronary artery without angina pectoris: Secondary | ICD-10-CM | POA: Diagnosis present

## 2011-11-20 DIAGNOSIS — Z23 Encounter for immunization: Secondary | ICD-10-CM

## 2011-11-20 DIAGNOSIS — R42 Dizziness and giddiness: Secondary | ICD-10-CM | POA: Diagnosis present

## 2011-11-20 DIAGNOSIS — R079 Chest pain, unspecified: Secondary | ICD-10-CM | POA: Diagnosis present

## 2011-11-20 DIAGNOSIS — I10 Essential (primary) hypertension: Secondary | ICD-10-CM | POA: Diagnosis present

## 2011-11-20 DIAGNOSIS — R471 Dysarthria and anarthria: Secondary | ICD-10-CM | POA: Diagnosis present

## 2011-11-20 DIAGNOSIS — R279 Unspecified lack of coordination: Principal | ICD-10-CM | POA: Diagnosis present

## 2011-11-20 DIAGNOSIS — M51379 Other intervertebral disc degeneration, lumbosacral region without mention of lumbar back pain or lower extremity pain: Secondary | ICD-10-CM | POA: Diagnosis present

## 2011-11-20 DIAGNOSIS — M549 Dorsalgia, unspecified: Secondary | ICD-10-CM | POA: Diagnosis present

## 2011-11-20 DIAGNOSIS — I428 Other cardiomyopathies: Secondary | ICD-10-CM

## 2011-11-20 DIAGNOSIS — R4789 Other speech disturbances: Secondary | ICD-10-CM | POA: Diagnosis present

## 2011-11-20 DIAGNOSIS — M5137 Other intervertebral disc degeneration, lumbosacral region: Secondary | ICD-10-CM | POA: Diagnosis present

## 2011-11-20 DIAGNOSIS — G4761 Periodic limb movement disorder: Secondary | ICD-10-CM

## 2011-11-20 DIAGNOSIS — Z95810 Presence of automatic (implantable) cardiac defibrillator: Secondary | ICD-10-CM

## 2011-11-20 DIAGNOSIS — I252 Old myocardial infarction: Secondary | ICD-10-CM

## 2011-11-20 DIAGNOSIS — Z951 Presence of aortocoronary bypass graft: Secondary | ICD-10-CM

## 2011-11-20 DIAGNOSIS — E7800 Pure hypercholesterolemia, unspecified: Secondary | ICD-10-CM | POA: Diagnosis present

## 2011-11-20 DIAGNOSIS — G4710 Hypersomnia, unspecified: Secondary | ICD-10-CM

## 2011-11-20 DIAGNOSIS — R270 Ataxia, unspecified: Secondary | ICD-10-CM | POA: Diagnosis present

## 2011-11-20 DIAGNOSIS — I2589 Other forms of chronic ischemic heart disease: Secondary | ICD-10-CM | POA: Diagnosis present

## 2011-11-20 DIAGNOSIS — F319 Bipolar disorder, unspecified: Secondary | ICD-10-CM

## 2011-11-20 DIAGNOSIS — F17200 Nicotine dependence, unspecified, uncomplicated: Secondary | ICD-10-CM | POA: Diagnosis present

## 2011-11-20 NOTE — ED Notes (Signed)
Patient states that he became really dizzy this morning; after lying down and taking a nap, patient went to stand up from the bed and fell.  Patient states that he passed out, but did not hit his head on anything; was assisted back up with wife.  Patient states that he needs assistance when getting in and out of bed due to spinal surgery.  Patient also complaining of headache.  Hand grips and foot pushes bilaterally equal and strong; no slurred speech or asymmetrical smile noted.

## 2011-11-21 ENCOUNTER — Encounter (HOSPITAL_COMMUNITY): Payer: Self-pay | Admitting: Radiology

## 2011-11-21 ENCOUNTER — Emergency Department (HOSPITAL_COMMUNITY): Payer: Medicaid Other

## 2011-11-21 DIAGNOSIS — R42 Dizziness and giddiness: Secondary | ICD-10-CM

## 2011-11-21 DIAGNOSIS — R471 Dysarthria and anarthria: Secondary | ICD-10-CM

## 2011-11-21 DIAGNOSIS — R079 Chest pain, unspecified: Secondary | ICD-10-CM | POA: Diagnosis present

## 2011-11-21 DIAGNOSIS — R270 Ataxia, unspecified: Secondary | ICD-10-CM | POA: Diagnosis present

## 2011-11-21 DIAGNOSIS — I6350 Cerebral infarction due to unspecified occlusion or stenosis of unspecified cerebral artery: Secondary | ICD-10-CM

## 2011-11-21 HISTORY — DX: Dysarthria and anarthria: R47.1

## 2011-11-21 HISTORY — DX: Dizziness and giddiness: R42

## 2011-11-21 HISTORY — DX: Ataxia, unspecified: R27.0

## 2011-11-21 LAB — COMPREHENSIVE METABOLIC PANEL
ALT: 14 U/L (ref 0–53)
AST: 21 U/L (ref 0–37)
Alkaline Phosphatase: 112 U/L (ref 39–117)
CO2: 27 mEq/L (ref 19–32)
Chloride: 105 mEq/L (ref 96–112)
GFR calc Af Amer: >90 mL/min (ref >90)
GFR calc non Af Amer: >90 mL/min (ref >90)
Glucose, Bld: 101 mg/dL — ABNORMAL HIGH (ref 70–99)
Potassium: 3.9 mEq/L (ref 3.5–5.1)
Sodium: 140 mEq/L (ref 135–145)
Total Bilirubin: 0.3 mg/dL (ref 0.3–1.2)

## 2011-11-21 LAB — URINALYSIS, ROUTINE W REFLEX MICROSCOPIC
Bilirubin Urine: NEGATIVE
Hgb urine dipstick: NEGATIVE
Ketones, ur: 15 mg/dL — AB
Nitrite: NEGATIVE
Urobilinogen, UA: 0.2 mg/dL (ref 0.0–1.0)

## 2011-11-21 LAB — CARDIAC PANEL(CRET KIN+CKTOT+MB+TROPI)
Relative Index: 1.9 (ref 0.0–2.5)
Total CK: 300 U/L — ABNORMAL HIGH (ref 7–232)
Troponin I: <0.3 ng/mL (ref <0.30)
Troponin I: <0.3 ng/mL (ref <0.30)

## 2011-11-21 LAB — CBC
Hemoglobin: 12.1 g/dL — ABNORMAL LOW (ref 13.0–17.0)
MCH: 29.4 pg (ref 26.0–34.0)
Platelets: 189 10*3/uL (ref 150–400)
RBC: 4.12 MIL/uL — ABNORMAL LOW (ref 4.22–5.81)
WBC: 5.5 10*3/uL (ref 4.0–10.5)

## 2011-11-21 LAB — LIPID PANEL
HDL: 44 mg/dL (ref >39)
LDL Cholesterol: 79 mg/dL (ref 0–99)
Total CHOL/HDL Ratio: 3.3 RATIO
VLDL: 21 mg/dL (ref 0–40)

## 2011-11-21 MED ORDER — ASPIRIN 81 MG PO CHEW: 81.0000 mg | CHEWABLE_TABLET | Freq: Every day | ORAL | Status: DC

## 2011-11-21 MED ORDER — SIMVASTATIN 20 MG PO TABS
20.0000 mg | ORAL_TABLET | Freq: Every day | ORAL | Status: DC
  Administered 2011-11-22: 20 mg via ORAL
  Filled 2011-11-21 (×3): qty 1

## 2011-11-21 MED ORDER — OXYCODONE HCL 5 MG PO TABS
15.0000 mg | ORAL_TABLET | ORAL | Status: DC
  Administered 2011-11-21 – 2011-11-22 (×6): 15 mg via ORAL
  Filled 2011-11-21 (×6): qty 3

## 2011-11-21 MED ORDER — TIOTROPIUM BROMIDE MONOHYDRATE 18 MCG IN CAPS
18.0000 ug | ORAL_CAPSULE | Freq: Every day | RESPIRATORY_TRACT | Status: DC
  Administered 2011-11-21 – 2011-11-23 (×3): 18 ug via RESPIRATORY_TRACT
  Filled 2011-11-21: qty 5

## 2011-11-21 MED ORDER — ONDANSETRON HCL 4 MG/2ML IJ SOLN: 4.0000 mg | Freq: Four times a day (QID) | INTRAMUSCULAR | Status: DC | PRN

## 2011-11-21 MED ORDER — ASPIRIN 325 MG PO TABS
325.0000 mg | ORAL_TABLET | Freq: Every day | ORAL | Status: DC
  Administered 2011-11-21 – 2011-11-23 (×3): 325 mg via ORAL
  Filled 2011-11-21 (×4): qty 1

## 2011-11-21 MED ORDER — ACETAMINOPHEN 325 MG PO TABS: 650.0000 mg | ORAL_TABLET | ORAL | Status: DC | PRN

## 2011-11-21 MED ORDER — SODIUM CHLORIDE 0.9 % IV SOLN
INTRAVENOUS | Status: DC
  Administered 2011-11-21: via INTRAVENOUS

## 2011-11-21 MED ORDER — CYCLOBENZAPRINE HCL 10 MG PO TABS
10.0000 mg | ORAL_TABLET | Freq: Three times a day (TID) | ORAL | Status: DC | PRN
  Administered 2011-11-21 – 2011-11-22 (×2): 10 mg via ORAL
  Filled 2011-11-21 (×2): qty 1

## 2011-11-21 MED ORDER — LISINOPRIL 10 MG PO TABS
10.0000 mg | ORAL_TABLET | Freq: Every day | ORAL | Status: DC
  Administered 2011-11-21 – 2011-11-22 (×2): 10 mg via ORAL
  Filled 2011-11-21 (×2): qty 1

## 2011-11-21 MED ORDER — ENOXAPARIN SODIUM 40 MG/0.4ML ~~LOC~~ SOLN
40.0000 mg | SUBCUTANEOUS | Status: DC
  Administered 2011-11-21 – 2011-11-23 (×3): 40 mg via SUBCUTANEOUS
  Filled 2011-11-21 (×5): qty 0.4

## 2011-11-21 MED ORDER — HYDROMORPHONE HCL PF 1 MG/ML IJ SOLN
1.0000 mg | Freq: Once | INTRAMUSCULAR | Status: AC
  Administered 2011-11-21: 1 mg via INTRAVENOUS
  Filled 2011-11-21: qty 1

## 2011-11-21 MED ORDER — SENNOSIDES-DOCUSATE SODIUM 8.6-50 MG PO TABS: 1.0000 | ORAL_TABLET | Freq: Every evening | ORAL | Status: DC | PRN

## 2011-11-21 MED ORDER — ASPIRIN 81 MG PO TABS: 81.0000 mg | ORAL_TABLET | Freq: Every day | ORAL | Status: DC

## 2011-11-21 MED ORDER — FLUTICASONE PROPIONATE HFA 44 MCG/ACT IN AERO
2.0000 | INHALATION_SPRAY | Freq: Two times a day (BID) | RESPIRATORY_TRACT | Status: DC
  Administered 2011-11-21 – 2011-11-23 (×5): 2 via RESPIRATORY_TRACT
  Filled 2011-11-21: qty 10.6

## 2011-11-21 MED ORDER — HYDROMORPHONE HCL ER 8 MG PO T24A: 32.0000 mg | EXTENDED_RELEASE_TABLET | Freq: Every day | ORAL | Status: DC

## 2011-11-21 MED ORDER — PANTOPRAZOLE SODIUM 40 MG PO TBEC
40.0000 mg | DELAYED_RELEASE_TABLET | Freq: Every day | ORAL | Status: DC
  Administered 2011-11-21 – 2011-11-23 (×3): 40 mg via ORAL
  Filled 2011-11-21 (×3): qty 1

## 2011-11-21 MED ORDER — ALBUTEROL SULFATE (5 MG/ML) 0.5% IN NEBU: 2.5000 mg | INHALATION_SOLUTION | Freq: Four times a day (QID) | RESPIRATORY_TRACT | Status: DC | PRN

## 2011-11-21 MED ORDER — ZOLPIDEM TARTRATE 10 MG PO TABS: 10.0000 mg | ORAL_TABLET | Freq: Every evening | ORAL | Status: DC | PRN

## 2011-11-21 MED ORDER — METOPROLOL TARTRATE 50 MG PO TABS
50.0000 mg | ORAL_TABLET | Freq: Two times a day (BID) | ORAL | Status: DC
  Administered 2011-11-21 – 2011-11-23 (×5): 50 mg via ORAL
  Filled 2011-11-21 (×6): qty 1

## 2011-11-21 MED ORDER — ASPIRIN 300 MG RE SUPP
300.0000 mg | Freq: Every day | RECTAL | Status: DC
  Filled 2011-11-21 (×4): qty 1

## 2011-11-21 MED ORDER — NITROGLYCERIN 0.4 MG SL SUBL: 0.4000 mg | SUBLINGUAL_TABLET | SUBLINGUAL | Status: DC | PRN

## 2011-11-21 MED ORDER — HYDROMORPHONE HCL ER 32 MG PO T24A: 1.0000 | EXTENDED_RELEASE_TABLET | Freq: Every day | ORAL | Status: DC

## 2011-11-21 MED ORDER — HYDROMORPHONE HCL 2 MG PO TABS
2.0000 mg | ORAL_TABLET | ORAL | Status: DC | PRN
  Administered 2011-11-21 (×2): 2 mg via ORAL
  Filled 2011-11-21 (×2): qty 1

## 2011-11-21 MED ORDER — DIAZEPAM 5 MG PO TABS
10.0000 mg | ORAL_TABLET | Freq: Two times a day (BID) | ORAL | Status: DC
  Administered 2011-11-21 – 2011-11-22 (×3): 10 mg via ORAL
  Filled 2011-11-21 (×3): qty 2

## 2011-11-21 NOTE — ED Notes (Signed)
Moved to [place] and to monitor.

## 2011-11-21 NOTE — Progress Notes (Signed)
Subjective: Patient admitted early this AM  Objective: Vital signs in last 24 hours: Filed Vitals:   11/21/11 0400 11/21/11 0415 11/21/11 0433 11/21/11 0550  BP: 133/62 151/76 151/76 149/77  Pulse: 70 83 73 92  Temp:   98.3 F (36.8 C) 98.7 F (37.1 C)  TempSrc:   Oral Oral  Resp: 15  18 12   SpO2: 98% 98% 96% 99%   Weight change:  No intake or output data in the 24 hours ending 11/21/11 0913  Physical Exam: General: Awake, Oriented, No acute distress. HEENT: EOMI. Neck: Supple CV: S1 and S2 Lungs: Clear to ascultation bilaterally Abdomen: Soft, Nontender, Nondistended, +bowel sounds. Ext: Good pulses. Trace edema. Neuro: dysarthria  Lab Results:  [facility] 11/21/11 0010  NA 140  K 3.9  CL 105  CO2 27  GLUCOSE 101*  BUN 10  CREATININE 0.80  CALCIUM 9.7  MG --  PHOS --    Basename 11/21/11 0010  AST 21  ALT 14  ALKPHOS 112  BILITOT 0.3  PROT 6.4  ALBUMIN 3.8     Basename 11/21/11 0010  WBC 5.5  NEUTROABS --  HGB 12.1*  HCT 36.9*  MCV 89.6  PLT 189        Basename 11/21/11 0450  CHOL 144  HDL 44  LDLCALC 79  TRIG 106  CHOLHDL 3.3  LDLDIRECT --        Studies/Results: Dg Chest 2 View  11/21/2011  *RADIOLOGY REPORT*  Clinical Data: Dizziness.  Shortness of breath.  Lower chest pain for 12 hours.  History of pacemaker, bypass surgery, spinal surgery, hypertension, COPD.  CHEST - 2 VIEW  Comparison: 11/23/2010  Findings: The patient has a left-sided AICD.  Lead overlies the right ventricle.  Patient has had median sternotomy.  Patient has had prior cervical fusion and thoracolumbar fusion.  The heart is enlarged.  No edema.  There are no focal consolidations or pleural effusions.  IMPRESSION:  1.  Cardiomegaly. 2.  No edema or focal pulmonary abnormality.  Original Report Authenticated By: Patterson Hammersmith, M.D.   Ct Head Wo Contrast  11/21/2011  *RADIOLOGY REPORT*  Clinical Data: Headache and dizziness with fall.  CT HEAD WITHOUT  CONTRAST  Technique:  Contiguous axial images were obtained from the base of the skull through the vertex without contrast.  Comparison: 07/12/2011  Findings: Mild cerebral atrophy.  Mild ventricular dilatation suggesting central atrophy.  No mass effect or midline shift.  No abnormal extra-axial fluid collections.  Gray-white matter junctions are distinct.  Basal cisterns are not effaced.  No evidence of acute intracranial hemorrhage.  No depressed skull fractures.  Visualized paranasal sinuses are not opacified.  No significant change since previous study.  IMPRESSION: No evidence of acute intracranial hemorrhage, mass lesion, or acute infarct.  Original Report Authenticated By: Marlon Pel, M.D.    Medications: I have reviewed the patient's current medications. Scheduled Meds:   . aspirin  300 mg Rectal Daily   Or  . aspirin  325 mg Oral Daily  . diazepam  10 mg Oral BID  . enoxaparin  40 mg Subcutaneous Q24H  . fluticasone  2 puff Inhalation BID  .  HYDROmorphone (DILAUDID) injection  1 mg Intravenous Once  . HYDROmorphone HCl  32 mg Oral Daily  . lisinopril  10 mg Oral Daily  . metoprolol  50 mg Oral BID  . oxycodone  15 mg Oral Q4H  . pantoprazole  40 mg Oral Q1200  . simvastatin  20  mg Oral q1800  . tiotropium  18 mcg Inhalation Daily  . DISCONTD: aspirin  81 mg Oral Daily  . DISCONTD: aspirin  81 mg Oral Daily  . DISCONTD: HYDROmorphone HCl  1 tablet Oral Daily   Continuous Infusions:   . sodium chloride 125 mL/hr at 11/21/11 0029   PRN Meds:.acetaminophen, albuterol, cyclobenzaprine, nitroGLYCERIN, ondansetron (ZOFRAN) IV, senna-docusate, zolpidem  Assessment/Plan: Principal Problem:  1. Ataxia- CVA w/up, neuro consulted, No MRI secondary to AICD and pt is allergic to IV contrast dye- patient says this is normal for him except now he has numbness in B/L hands so probably not a CVA/TIA  2. Chest pain- check CE as pt has CAD in past   3. HYPERCHOLESTEROLEMIA-  zocor  4.  BIPOLAR DISORDER UNSPECIFIED   5. TOBACCO ABUSE- encourage cessation   6. HYPERTENSION   7.  BACK PAIN, CHRONIC  Hope for D/C tomm if CE normal      LOS: 1 day  Jasdeep Kepner, DO 11/21/2011, 9:13 AM

## 2011-11-21 NOTE — H&P (Addendum)
DATE OF ADMISSION:  11/21/2011  PCP:    No primary provider on file.   Chief Complaint: Dizziness, Difficulty Walking, and Slurred Speech   HPI: [name] is an 61 y.o. male with a history of CAD, and Chronic Pain who presents to the ED with complaints of slurring of his speech dizziness, and difficulty walking since the AM upon awakening.  He reports getting up from bed and falling to the floor. He denied having weakness in any particular side.  He reports also having chest pain and a Headache, He states the symptoms have not improved at all.  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     Past Medical History  Diagnosis Date  . Coronary artery disease     bypass grafting. Left internal mammary artery to left anterior descending   . Hypertension   . Myocardial infarction     hx of  . Hypercholesteremia   . Periodic limb movement disorder   . Hypersomnia, unspecified   . Syncope and collapse   . Tobacco use disorder   . Degeneration of lumbar or lumbosacral intervertebral disc   . Personal history of other diseases of digestive system   . Cocaine abuse     hx of   . Alcohol abuse     hx of  . Back pain, chronic   . Bipolar disorder, unspecified   . Aortocoronary bypass status 1996    surgery  . Defibrillator activation   . History of ventricular tachycardia      status post automatic implantable cardioverter defibrillator/pacer  . 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 MI (myocardial infarction) 1989 & 2006    with LV dysfunction  . AAA (abdominal aortic aneurysm)   . History of gastrointestinal bleeding     Past Surgical History  Procedure Date  . Aortocoronary byp   . Icd pla   . Cardiac defibrillator placement 10/23/2007     Medtronic Maximo single chamber cardioverter-defibrillator  . Coronary artery bypass graft  2007    in [place], [place] -- LIMA to LAD, SVG to diagonal, SVG to RCA, SVG to circumflex (saphenous vein graft to circumflex totaled at catheterization in 2006)  . Cardiac catheterization 05/14/2006    Est EF of over 50%  . Cardiac catheterization     Est EF of 50% --   . Cardiac catheterization 09/03/2008    Continued patency of the internal mammary to the LAD -- of the saphenous vein graft to the diagonal -- of the saphenous vein graft to PDA -- Occluded saphenous vein graft to the circumflex with essentially widely patent circumflex vessel  . Lumbar fusion     L5-S1 transforaminal lumbar interbody fusion with contralateral posterolateral fusion at the same level -- Posterolateral fusion T10-11, T11-12, T12-L1, L1-L2, L2-L3, L3-L4, L4-L5 --   . Lumbar laminectomy/decompression microdiscectomy 11/26/2010    Laminectomy with decompression of the spinal cord C3-4, C4-5, C5-6,C6-7    Medications:  HOME MEDS: Prior to Admission medications   Medication Sig Start Date End Date Taking? Authorizing Provider  albuterol (PROVENTIL) (2.5 MG/3ML) 0.083% nebulizer solution Take 2.5 mg by nebulization every 6 (six) hours as needed. For shortness of breath.   Yes Historical Provider, MD  aspirin 81 MG tablet Take 81 mg by mouth daily.  Yes Historical Provider, MD  beclomethasone (QVAR) 40 MCG/ACT inhaler Inhale 2 puffs into the lungs daily.    Yes Historical Provider, MD  cyclobenzaprine (FLEXERIL) 10 MG tablet Take 10 mg by mouth 3 (three) times daily as needed. For muscle spasms.   Yes Historical Provider, MD  diazepam (VALIUM) 10 MG tablet Take 10 mg by mouth 2 (two) times daily.     Yes Historical Provider, MD  HYDROmorphone HCl (EXALGO) 32 MG TB24 Take 1 tablet by mouth daily.   Yes Historical Provider, MD  lisinopril (PRINIVIL,ZESTRIL) 10 MG tablet Take 10 mg by mouth daily.     Yes Historical Provider, MD  metoprolol (LOPRESSOR) 50 MG tablet Take 50 mg by mouth 2 (two) times daily.      Yes Historical Provider, MD  nitroGLYCERIN (NITROSTAT) 0.4 MG SL tablet Place 0.4 mg under the tongue every 5 (five) minutes x 3 doses as needed. For chest pain.   Yes Historical Provider, MD  omeprazole (PRILOSEC) 20 MG capsule Take 20 mg by mouth daily.     Yes Historical Provider, MD  oxycodone (ROXICODONE) 30 MG immediate release tablet Take 15 mg by mouth every 4 (four) hours.   Yes Historical Provider, MD  simvastatin (ZOCOR) 20 MG tablet Take 20 mg by mouth at bedtime.     Yes Historical Provider, MD  tiotropium (SPIRIVA) 18 MCG inhalation capsule Place 18 mcg into inhaler and inhale daily.     Yes Historical Provider, MD    Allergies:  Allergies  Allergen Reactions  . Amitriptyline Hcl   . Codeine   . Iohexol      Desc: PT STATES THAT HE HAS CONVULSIONS/SEIZURES S/P CONTRAST.  PT WAS GIVEN 1 HR PREMEDS AND HAD NO PROBLEMS.  STEPHANIE DAVIS RT-RCT, Onset Date: [id number]   . Ivp Dye (Iodinated Diagnostic Agents)   . Nifedipine     Social History:   reports that he has been smoking Cigarettes.  He has been smoking about .33 packs per day. He does not have any smokeless tobacco history on file. He reports that he does not drink alcohol or use illicit drugs.  Family History: Family History  Problem Relation Age of Onset  . Heart disease Mother 28    deceased  . Heart disease Father 18    deceased  . Heart disease Brother     alive  . Heart attack Mother   . Heart attack Father   . GI problems Sister     bleed    Review of Systems:  The patient denies anorexia, fever, weight loss, vision loss, decreased hearing, hoarseness, syncope, dyspnea on exertion, peripheral edema, hemoptysis, abdominal pain, melena, hematochezia, severe indigestion/heartburn, hematuria, incontinence, genital sores, suspicious skin lesions, transient blindness, depression, unusual weight change, abnormal bleeding, enlarged lymph nodes, angioedema, and breast masses.   Physical Exam:  GEN:  Pleasant Well nourished and well developed 61 year old Caucasian male examined  and in no acute distress; cooperative with exam Filed Vitals:   11/21/11 0330 11/21/11 0345 11/21/11 0400 11/21/11 0433  BP: 128/65 138/64 133/62 151/76  Pulse: 65 67 70 73  Temp:    98.3 F (36.8 C)  TempSrc:    Oral  Resp: 13 10 15 18   SpO2: 96% 95% 98% 96%   Blood pressure 151/76, pulse 73, temperature 98.3 F (36.8 C), temperature source Oral, resp. rate 18, SpO2 96.00%. PSYCH: He is alert and oriented x4; does not appear anxious does not appear depressed; affect is normal  HEENT: Normocephalic and Atraumatic, Mucous membranes pink; PERRLA; EOM intact; Fundi:  Benign;  No scleral icterus, Nares: Patent, Oropharynx: Clear, Upper Denture, Absent front 2 lower teeth, Neck:  FROM, no cervical lymphadenopathy nor thyromegaly or carotid bruit; no JVD; Breasts:: Not examined CHEST WALL: No tenderness, Left anterior Chest Nodule (consistent with AICD) CHEST: Normal respiration, clear to auscultation bilaterally HEART: Regular rate and rhythm; no murmurs rubs or gallops BACK: No kyphosis or scoliosis; no CVA tenderness ABDOMEN: Positive Bowel Sounds, Scaphoid, soft non-tender; no masses, no organomegaly, no pannus; no intertriginous candida. Rectal Exam: Not done EXTREMITIES: No cyanosis, clubbing or edema; no ulcerations. Genitalia: not examined PULSES: 2+ and symmetric SKIN: Normal hydration no rash or ulceration CNS: Cranial nerves 2-12 grossly Except  Deficits in Speech with +DYSARTHRIA, +ATAXIA +Right BABINSKI SIGN (?Chronic from previous CVA)   Labs & Imaging Results for orders placed during the hospital encounter of 11/20/11 (from the past 48 hour(s))  CBC     Status: Abnormal   Collection Time   11/21/11 12:10 AM      Component Value Range Comment   WBC 5.5  4.0 - 10.5 (K/uL)    RBC 4.12 (*) 4.22 - 5.81 (MIL/uL)    Hemoglobin 12.1 (*) 13.0 - 17.0 (g/dL)    HCT 29.5 (*) 62.1 - 52.0 (%)    MCV 89.6   78.0 - 100.0 (fL)    MCH 29.4  26.0 - 34.0 (pg)    MCHC 32.8  30.0 - 36.0 (g/dL)    RDW 30.8  65.7 - 84.6 (%)    Platelets 189  150 - 400 (K/uL)   COMPREHENSIVE METABOLIC PANEL     Status: Abnormal   Collection Time   11/21/11 12:10 AM      Component Value Range Comment   Sodium 140  135 - 145 (mEq/L)    Potassium 3.9  3.5 - 5.1 (mEq/L)    Chloride 105  96 - 112 (mEq/L)    CO2 27  19 - 32 (mEq/L)    Glucose, Bld 101 (*) 70 - 99 (mg/dL)    BUN 10  6 - 23 (mg/dL)    Creatinine, Ser 9.62  0.50 - 1.35 (mg/dL)    Calcium 9.7  8.4 - 10.5 (mg/dL)    Total Protein 6.4  6.0 - 8.3 (g/dL)    Albumin 3.8  3.5 - 5.2 (g/dL)    AST 21  0 - 37 (U/L)    ALT 14  0 - 53 (U/L)    Alkaline Phosphatase 112  39 - 117 (U/L)    Total Bilirubin 0.3  0.3 - 1.2 (mg/dL)    GFR calc non Af Amer >90  >90 (mL/min)    GFR calc Af Amer >90  >90 (mL/min)   URINALYSIS, ROUTINE W REFLEX MICROSCOPIC     Status: Abnormal   Collection Time   11/21/11  2:35 AM      Component Value Range Comment   Color, Urine YELLOW  YELLOW     APPearance CLEAR  CLEAR     Specific Gravity, Urine 1.023  1.005 - 1.030     pH 5.5  5.0 - 8.0     Glucose, UA NEGATIVE  NEGATIVE (mg/dL)    Hgb urine dipstick NEGATIVE  NEGATIVE     Bilirubin Urine NEGATIVE  NEGATIVE     Ketones, ur 15 (*) NEGATIVE (mg/dL)    Protein, ur NEGATIVE  NEGATIVE (mg/dL)    Urobilinogen, UA 0.2  0.0 - 1.0 (  mg/dL)    Nitrite NEGATIVE  NEGATIVE     Leukocytes, UA NEGATIVE  NEGATIVE  MICROSCOPIC NOT DONE ON URINES WITH NEGATIVE PROTEIN, BLOOD, LEUKOCYTES, NITRITE, OR GLUCOSE <1000 mg/dL.   Dg Chest 2 View  11/21/2011  *RADIOLOGY REPORT*  Clinical Data: Dizziness.  Shortness of breath.  Lower chest pain for 12 hours.  History of pacemaker, bypass surgery, spinal surgery, hypertension, COPD.  CHEST - 2 VIEW  Comparison: 11/23/2010  Findings: The patient has a left-sided AICD.  Lead overlies the right ventricle.  Patient has had median sternotomy.  Patient has had prior  cervical fusion and thoracolumbar fusion.  The heart is enlarged.  No edema.  There are no focal consolidations or pleural effusions.  IMPRESSION:  1.  Cardiomegaly. 2.  No edema or focal pulmonary abnormality.  Original Report Authenticated By: Patterson Hammersmith, M.D.   Ct Head Wo Contrast  11/21/2011  *RADIOLOGY REPORT*  Clinical Data: Headache and dizziness with fall.  CT HEAD WITHOUT CONTRAST  Technique:  Contiguous axial images were obtained from the base of the skull through the vertex without contrast.  Comparison: 07/12/2011  Findings: Mild cerebral atrophy.  Mild ventricular dilatation suggesting central atrophy.  No mass effect or midline shift.  No abnormal extra-axial fluid collections.  Gray-white matter junctions are distinct.  Basal cisterns are not effaced.  No evidence of acute intracranial hemorrhage.  No depressed skull fractures.  Visualized paranasal sinuses are not opacified.  No significant change since previous study.  IMPRESSION: No evidence of acute intracranial hemorrhage, mass lesion, or acute infarct.  Original Report Authenticated By: Marlon Pel, M.D.      Assessment/Plan: Present on Admission:  .Ataxia .Dysarthria .Vertigo .HYPERCHOLESTEROLEMIA .TOBACCO ABUSE .CAD .DEGENERATIVE DISC DISEASE, LUMBAR SPINE .BACK PAIN, CHRONIC .Chest pain .HYPERTENSION    Plan:      CVA Workup  Please Note that Patient has an AICD and can not have MRIs,  He is also ALLERGIC  TO IV CONTRAST DYE WITH ANAPHYLAXIS  Cardiac Enzymes due to chest pain Check Lipid Panel Pain Control Reconcile Home Medications DVT Prophylaxis Smoking Cessation counseling done Other plans as per orders.     CODE STATUS:      FULL CODE        Ayaana Biondo C 11/21/2011, 4:45 AM

## 2011-11-21 NOTE — Evaluation (Signed)
Physical Therapy Evaluation Patient Details Name: [name] MRN: [id number] DOB: 08-14-1951 Today's Date: 11/21/2011  Problem List:  Patient Active Problem List  Diagnoses  . HYPERCHOLESTEROLEMIA  . BIPOLAR DISORDER UNSPECIFIED  . TOBACCO ABUSE  . Periodic limb movement disorder  . HYPERTENSION  . MYOCARDIAL INFARCTION, HX OF  . CAD  . DEGENERATIVE DISC DISEASE, LUMBAR SPINE  . BACK PAIN, CHRONIC  . HYPERSOMNIA  . Ataxia  . Dysarthria  . Vertigo  . Chest pain    Past Medical History:  Past Medical History  Diagnosis Date  . Coronary artery disease     bypass grafting. Left internal mammary artery to left anterior descending   . Hypertension   . Myocardial infarction     hx of  . Hypercholesteremia   . Periodic limb movement disorder   . Hypersomnia, unspecified   . Syncope and collapse   . Tobacco use disorder   . Degeneration of lumbar or lumbosacral intervertebral disc   . Personal history of other diseases of digestive system   . Cocaine abuse     hx of   . Alcohol abuse     hx of  . Back pain, chronic   . Bipolar disorder, unspecified   . Aortocoronary bypass status 1996    surgery  . Defibrillator activation   . History of ventricular tachycardia      status post automatic implantable cardioverter defibrillator/pacer  . 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 MI (myocardial infarction) 1989 & 2006    with LV dysfunction  . AAA (abdominal aortic aneurysm)   . History of gastrointestinal bleeding    Past Surgical History:  Past Surgical History  Procedure Date  . Aortocoronary byp   . Icd pla   . Cardiac defibrillator placement 10/23/2007     Medtronic Maximo single chamber cardioverter-defibrillator  . Coronary artery bypass graft 2007    in [place], [place] -- LIMA to LAD, SVG to diagonal, SVG to RCA, SVG to circumflex (saphenous vein graft to circumflex totaled at catheterization in  2006)  . Cardiac catheterization 05/14/2006    Est EF of over 50%  . Cardiac catheterization     Est EF of 50% --   . Cardiac catheterization 09/03/2008    Continued patency of the internal mammary to the LAD -- of the saphenous vein graft to the diagonal -- of the saphenous vein graft to PDA -- Occluded saphenous vein graft to the circumflex with essentially widely patent circumflex vessel  . Lumbar fusion     L5-S1 transforaminal lumbar interbody fusion with contralateral posterolateral fusion at the same level -- Posterolateral fusion T10-11, T11-12, T12-L1, L1-L2, L2-L3, L3-L4, L4-L5 --   . Lumbar laminectomy/decompression microdiscectomy 11/26/2010    Laminectomy with decompression of the spinal cord C3-4, C4-5, C5-6,C6-7    PT Assessment/Plan/Recommendation PT Assessment Clinical Impression Statement: 61 y.o. male admitted secondary to several falls at home. Pt reports dizziness although no true vertigo. Pt does not appear to have position dependent dizziness and no vertigo, do not expect vestibular dysfucntion at this time although will continue to assess. He does present with generalized imbalance and poor gait mechanics. Pt had multiple Lt. LE spasms? which pt reports are from previous back surgeries. Pt reports 24 hour supervision at home however appropriate discharge environment will be more clear with next sessions.  PT Recommendation/Assessment: Patient will need skilled PT in the acute care venue  PT Problem List: Decreased strength;Decreased activity tolerance;Decreased balance;Decreased mobility;Decreased knowledge of use of DME;Decreased safety awareness;Pain;Decreased cognition PT Therapy Diagnosis : Difficulty walking;Abnormality of gait;Generalized weakness PT Plan PT Frequency: Min 3X/week PT Treatment/Interventions: DME instruction;Gait training;Stair training;Functional mobility training;Therapeutic activities;Therapeutic exercise;Balance training;Cognitive  remediation;Patient/family education PT Recommendation Follow Up Recommendations: Home health PT VS Skilled nursing facility;Supervision/Assistance - 24 hour (pending progress) Equipment Recommended: Other (comment) (to be determined) PT Goals  Acute Rehab PT Goals PT Goal Formulation: With patient Time For Goal Achievement: 2 weeks Pt will go Sit to Stand: with modified independence PT Goal: Sit to Stand - Progress: Goal set today Pt will go Stand to Sit: with modified independence PT Goal: Stand to Sit - Progress: Goal set today Pt will Ambulate: 51 - 150 feet;with supervision;with least restrictive assistive device PT Goal: Ambulate - Progress: Goal set today Pt will Go Up / Down Stairs: 3-5 stairs;with supervision;with least restrictive assistive device PT Goal: Up/Down Stairs - Progress: Goal set today Additional Goals Additional Goal #1: Score >/= 45/56 on Berg Balance Test PT Goal: Additional Goal #1 - Progress: Goal set today  PT Evaluation Precautions/Restrictions   Falls  Prior Functioning  Home Living Lives With: Spouse Type of Home: House Home Layout: One level Home Access: Stairs to enter Entrance Stairs-Rails: None Entrance Stairs-Number of Steps: 3 Bathroom Shower/Tub: Engineer, manufacturing systems: Standard Bathroom Accessibility: No (has to leave RW at door holds onto furniture) Home Adaptive Equipment: Shower chair with back;Wheelchair - [employer] - four wheeled;Bedside commode/3-in-1 Prior Function Level of Independence: Requires assistive device for independence Driving: Yes Vocation: On disability Cognition Cognition Arousal/Alertness: Awake/alert Overall Cognitive Status: Impaired Orientation Level: Oriented X4 Safety/Judgement: Decreased awareness of safety precautions Decreased Safety/Judgement: Decreased awareness of need for assistance Safety/Judgement - Other Comments: Pt stands when asked to not stand, takes hands off RW to test his  balance randomly. Problem Solving: Requires assistance for problem solving Sensation/Coordination Sensation Light Touch: Impaired by gross assessment Light Touch Impaired Details: Impaired RLE;Impaired LLE (bil. feet) Coordination Gross Motor Movements are Fluid and Coordinated: No Coordination and Movement Description: Pt with jerky, almost ataxic movements of Lt. LE Extremity Assessment RLE AROM (degrees) Overall AROM Right Lower Extremity: Within functional limits for tasks assessed RLE Strength RLE Overall Strength: Deficits Right Knee Flexion: 3+/5 Right Knee Extension: 3+/5 Right Ankle Dorsiflexion: 4/5 LLE AROM (degrees) Overall AROM Left Lower Extremity: Within functional limits for tasks assessed LLE Strength Left Hip Flexion: 3+/5 Left Knee Flexion: 3/5 Left Knee Extension: 3/5 Left Ankle Dorsiflexion: 3+/5 Mobility (including Balance) Bed Mobility Bed Mobility: Yes Rolling Right: 6: Modified independent (Device/Increase time) Right Sidelying to Sit: 6: Modified independent (Device/Increase time) Transfers Transfers: Yes Sit to Stand: 4: Min assist;From bed Sit to Stand Details (indicate cue type and reason): Assist for decreased stability. Verbal cues for UE positioning, pt continues to attempt to pull up on RW.  Stand to Sit: 4: Min assist Stand to Sit Details: Verbal cues to get fully square with chair prior to sitting. Assist to help control descent.  Ambulation/Gait Ambulation/Gait: Yes Ambulation/Gait Assistance: 4: Min assist Ambulation/Gait Assistance Details (indicate cue type and reason): Pt with generalized instability and takes hands off of RW at times demonstrating decreased safety awareness. Pt having "spasms" of Lt. LE with ambulation. Very odd mechanics Ambulation Distance (Feet): 30 Feet Assistive device: Rolling walker Gait Pattern: Step-through pattern;Decreased stride length;Trunk flexed Stairs: No  Posture/Postural Control Posture/Postural  Control: No significant limitations Balance Balance Assessed: Yes Static Sitting Balance  Static Sitting - Balance Support: No upper extremity supported;Feet supported Static Sitting - Level of Assistance: 5: Stand by assistance Static Standing Balance Static Standing - Balance Support: No upper extremity supported Static Standing - Level of Assistance: 4: Min assist Static Standing - Comment/# of Minutes: Pt with increased sway End of Session PT - End of Session Equipment Utilized During Treatment: Gait belt Activity Tolerance: Patient limited by fatigue;Other (comment) (spasms) Patient left: in chair;with call bell in reach Nurse Communication: Mobility status for ambulation;Other (comment) (pt up to chair) General Behavior During Session: [facility] for tasks performed Cognition: Impaired Cognitive Impairment: Decreased safety awareness, some impulsiveness which increases risk for falls.   Sherie Don 11/21/2011, 2:35 PM  Sherie Don) Carleene Mains PT, DPT Acute Rehabilitation [phone]

## 2011-11-21 NOTE — ED Notes (Signed)
Report called to floor and patient moved to 3027.

## 2011-11-21 NOTE — Consult Note (Signed)
[facility] CONSULT NOTE     Reason for Consult: Recurrent falls of unclear etiology as well as slurred speech and dizziness    HPI:    [name] is an 61 y.o. male was brought to the hospital after experiencing several falls at home. Falls occurred with attempts to ambulate. There is no clear focal weakness. He is a history of back problems as well as back surgery and pain involving his left hip and leg. Speech was apparently more slurred than usual. He complained of dizziness but no true vertigo. CT scan of his head showed no acute intracranial abnormality. MRI study could not be obtained because patient has an implanted defibrillator. He has a history of her right pontine stroke. He was on aspirin 81 mg per day prior to admission. He is currently on aspirin 325 mg per day.  Past Medical History  Diagnosis Date  . Coronary artery disease     bypass grafting. Left internal mammary artery to left anterior descending   . Hypertension   . Myocardial infarction     hx of  . Hypercholesteremia   . Periodic limb movement disorder   . Hypersomnia, unspecified   . Syncope and collapse   . Tobacco use disorder   . Degeneration of lumbar or lumbosacral intervertebral disc   . Personal history of other diseases of digestive system   . Cocaine abuse     hx of   . Alcohol abuse     hx of  . Back pain, chronic   . Bipolar disorder, unspecified   . Aortocoronary bypass status 1996    surgery  . Defibrillator activation   . History of ventricular tachycardia      status post automatic implantable cardioverter defibrillator/pacer  . 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 MI (myocardial infarction) 1989 & 2006    with LV dysfunction  . AAA (abdominal aortic aneurysm)   . History of gastrointestinal bleeding     Past Surgical History  Procedure Date  . Aortocoronary byp   . Icd pla   . Cardiac  defibrillator placement 10/23/2007     Medtronic Maximo single chamber cardioverter-defibrillator  . Coronary artery bypass graft 2007    in [place], [place] -- LIMA to LAD, SVG to diagonal, SVG to RCA, SVG to circumflex (saphenous vein graft to circumflex totaled at catheterization in 2006)  . Cardiac catheterization 05/14/2006    Est EF of over 50%  . Cardiac catheterization     Est EF of 50% --   . Cardiac catheterization 09/03/2008    Continued patency of the internal mammary to the LAD -- of the saphenous vein graft to the diagonal -- of the saphenous vein graft to PDA -- Occluded saphenous vein graft to the circumflex with essentially widely patent circumflex vessel  . Lumbar fusion     L5-S1 transforaminal lumbar interbody fusion with contralateral posterolateral fusion at the same level -- Posterolateral fusion T10-11, T11-12, T12-L1, L1-L2, L2-L3, L3-L4, L4-L5 --   . Lumbar laminectomy/decompression microdiscectomy 11/26/2010    Laminectomy with decompression of the spinal cord C3-4, C4-5, C5-6,C6-7    Family History  Problem Relation Age of Onset  . Heart disease Mother 2    deceased  . Heart disease Father 26    deceased  .  Heart disease Brother     alive  . Heart attack Mother   . Heart attack Father   . GI problems Sister     bleed   Medications:    Scheduled:   . aspirin  300 mg Rectal Daily   Or  . aspirin  325 mg Oral Daily  . diazepam  10 mg Oral BID  . enoxaparin  40 mg Subcutaneous Q24H  . fluticasone  2 puff Inhalation BID  .  HYDROmorphone (DILAUDID) injection  1 mg Intravenous Once  . HYDROmorphone HCl  32 mg Oral Daily  . lisinopril  10 mg Oral Daily  . metoprolol  50 mg Oral BID  . oxycodone  15 mg Oral Q4H  . pantoprazole  40 mg Oral Q1200  . simvastatin  20 mg Oral q1800  . tiotropium  18 mcg Inhalation Daily  . DISCONTD: aspirin  81 mg Oral Daily  . DISCONTD: aspirin  81 mg Oral Daily  . DISCONTD: HYDROmorphone HCl  1 tablet Oral  Daily   Blood pressure 165/91, pulse 70, temperature 97.4 F (36.3 C), temperature source Oral, resp. rate 18, SpO2 95.00%.  Neurologic Examination:  Mental Status: Patient was lethargic and tended to fall asleep if not engaged verbally.  Speech was slurred with no indications of aphasia. Able to follow commands without difficulty. Cranial Nerves: II-Visual fields normal. III/IV/VI-Extraocular movements full and conjugate.  Pupils reacted normally to light V/VII-no facial numbness and no facial weakness VIII-grossly intact X-moderate dysarthria, commensurate with level of alertness. XII-midline tongue extension Motor: 5/5 bilaterally with normal tone and bulk; fairly frequent muscle spasms involving left thigh and hip. Sensory: light touch intact throughout, bilaterally Deep Tendon Reflexes: 1+ and symmetric throughout Plantars: Mute bilaterally Cerebellar: Normal finger-to-nose. Carotid auscultation: Normal  Assessment/Plan:  61 year old man with recurrent falls and apparent unstable gait of unclear etiology. Clinically he has no signs of acute stroke. There also is no evidence of an acute myelopathy. Etiology for dizziness is also unclear, as his etiology for slurred speech. I suspect his slurring of speech may be related to effects of pain medication, at least in part.  Recommendations:  1. Continue aspirin 325 mg per day 2 vitamin B12 and folate levels 3 physical therapy consultation for evaluation of patient's gait. 4. RPR  CR Michaeleen Down M.D. [facility] [phone]  11/21/2011, 10:10 AM

## 2011-11-21 NOTE — Progress Notes (Signed)
Bilateral carotid artery duplex completed.  Preliminary report is no evidence of significant ICA stenosis. 

## 2011-11-21 NOTE — ED Provider Notes (Signed)
History     CSN: [id number]  Arrival date & time 11/20/11  2243   First MD Initiated Contact with Patient 11/20/11 2347      Chief Complaint  Patient presents with  . Dizziness    (Consider location/radiation/quality/duration/timing/severity/associated sxs/prior treatment) HPI Patient woke up between 7:30 and 8:00 AM yesterday morning with dizziness. Upon getting up he fell due to the symptoms.  Dizziness has persisted throughout the day unchanged. Patient does have significant cardiac and stroke history. He denies any difficulty with his speech, but does have some difficulty with his saliva. He denies any chest pain or shortness of breath. He denies any new weakness or numbness. He does have chronic back pain that complicates his presentation. No vision changes. No headache at this time, did have one earlier. Patient takes aspirin daily but no other anticoagulants.  Symptoms moderate in severity. No known aggravating or alleviating factors.   Past Medical History  Diagnosis Date  . Coronary artery disease     bypass grafting. Left internal mammary artery to left anterior descending   . Hypertension   . Myocardial infarction     hx of  . Hypercholesteremia   . Periodic limb movement disorder   . Hypersomnia, unspecified   . Syncope and collapse   . Tobacco use disorder   . Degeneration of lumbar or lumbosacral intervertebral disc   . Personal history of other diseases of digestive system   . Cocaine abuse     hx of   . Alcohol abuse     hx of  . Back pain, chronic   . Bipolar disorder, unspecified   . Aortocoronary bypass status 1996    surgery  . Defibrillator activation   . History of ventricular tachycardia      status post automatic implantable cardioverter defibrillator/pacer  . 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 MI (myocardial infarction) 1989 & 2006    with LV dysfunction  . AAA (abdominal aortic aneurysm)   .  History of gastrointestinal bleeding     Past Surgical History  Procedure Date  . Aortocoronary byp   . Icd pla   . Cardiac defibrillator placement 10/23/2007     Medtronic Maximo single chamber cardioverter-defibrillator  . Coronary artery bypass graft 2007    in [place], [place] -- LIMA to LAD, SVG to diagonal, SVG to RCA, SVG to circumflex (saphenous vein graft to circumflex totaled at catheterization in 2006)  . Cardiac catheterization 05/14/2006    Est EF of over 50%  . Cardiac catheterization     Est EF of 50% --   . Cardiac catheterization 09/03/2008    Continued patency of the internal mammary to the LAD -- of the saphenous vein graft to the diagonal -- of the saphenous vein graft to PDA -- Occluded saphenous vein graft to the circumflex with essentially widely patent circumflex vessel  . Lumbar fusion     L5-S1 transforaminal lumbar interbody fusion with contralateral posterolateral fusion at the same level -- Posterolateral fusion T10-11, T11-12, T12-L1, L1-L2, L2-L3, L3-L4, L4-L5 --   . Lumbar laminectomy/decompression microdiscectomy 11/26/2010    Laminectomy with decompression of the spinal cord C3-4, C4-5, C5-6,C6-7    Family History  Problem Relation Age of Onset  . Heart disease Mother 49    deceased  . Heart disease Father 69    deceased  . Heart disease Brother     alive  .  Heart attack Mother   . Heart attack Father   . GI problems Sister     bleed    History  Substance Use Topics  . Smoking status: Current Everyday Smoker -- 0.3 packs/day    Types: Cigarettes  . Smokeless tobacco: Not on file   Comment: hx of tobacco abuse greater than 50 packs/years  . Alcohol Use: No      Review of Systems  Constitutional: Negative for fever and chills.  HENT: Negative for neck pain and neck stiffness.   Eyes: Negative for pain.  Respiratory: Negative for shortness of breath.   Cardiovascular: Negative for chest pain and palpitations.    Gastrointestinal: Negative for abdominal pain.  Genitourinary: Negative for dysuria.  Musculoskeletal: Negative for back pain.  Skin: Negative for rash.  Neurological: Positive for dizziness and headaches.  All other systems reviewed and are negative.    Allergies  Amitriptyline hcl; Codeine; Iohexol; Ivp dye; and Nifedipine  Home Medications   Current Outpatient Rx  Name Route Sig Dispense Refill  . ALBUTEROL SULFATE (2.5 MG/3ML) 0.083% IN NEBU Nebulization Take 2.5 mg by nebulization every 6 (six) hours as needed. For shortness of breath.    . ASPIRIN 81 MG PO TABS Oral Take 81 mg by mouth daily.      . BECLOMETHASONE DIPROPIONATE 40 MCG/ACT IN AERS Inhalation Inhale 2 puffs into the lungs daily.     . CYCLOBENZAPRINE HCL 10 MG PO TABS Oral Take 10 mg by mouth 3 (three) times daily as needed. For muscle spasms.    Marland Kitchen DIAZEPAM 10 MG PO TABS Oral Take 10 mg by mouth 2 (two) times daily.      Marland Kitchen HYDROMORPHONE HCL ER 32 MG PO TB24 Oral Take 1 tablet by mouth daily.    Marland Kitchen LISINOPRIL 10 MG PO TABS Oral Take 10 mg by mouth daily.      Marland Kitchen METOPROLOL TARTRATE 50 MG PO TABS Oral Take 50 mg by mouth 2 (two) times daily.      Marland Kitchen NITROGLYCERIN 0.4 MG SL SUBL Sublingual Place 0.4 mg under the tongue every 5 (five) minutes x 3 doses as needed. For chest pain.    Marland Kitchen OMEPRAZOLE 20 MG PO CPDR Oral Take 20 mg by mouth daily.      . OXYCODONE HCL 30 MG PO TABS Oral Take 15 mg by mouth every 4 (four) hours.    Marland Kitchen SIMVASTATIN 20 MG PO TABS Oral Take 20 mg by mouth at bedtime.      Marland Kitchen TIOTROPIUM BROMIDE MONOHYDRATE 18 MCG IN CAPS Inhalation Place 18 mcg into inhaler and inhale daily.        BP 128/65  Pulse 65  Temp(Src) 98.2 F (36.8 C) (Oral)  Resp 13  SpO2 96%  Physical Exam  Constitutional: He is oriented to person, place, and time. He appears well-developed and well-nourished.  HENT:  Head: Normocephalic and atraumatic.  Eyes: Conjunctivae and EOM are normal. Pupils are equal, round, and reactive  to light.  Neck: Trachea normal. Neck supple. No thyromegaly present.  Cardiovascular: Normal rate, regular rhythm, S1 normal, S2 normal and normal pulses.     No systolic murmur is present   No diastolic murmur is present  Pulses:      Radial pulses are 2+ on the right side, and 2+ on the left side.  Pulmonary/Chest: Effort normal and breath sounds normal. He has no wheezes. He has no rhonchi. He has no rales. He exhibits no tenderness.  Abdominal:  Soft. Normal appearance and bowel sounds are normal. There is no tenderness. There is no CVA tenderness and negative Murphy's sign.  Musculoskeletal:       BLE:s Calves nontender, no cords or erythema, negative Homans sign  Neurological: He is alert and oriented to person, place, and time. He has normal strength. No sensory deficit. GCS eye subscore is 4. GCS verbal subscore is 5. GCS motor subscore is 6.       Orthostatic dizziness with standing, unable to assess gait. Equal grips, biceps and triceps. Equal dorsi plantar flexion and hip flexion with stated weakness left lower extremity that is chronic due to back problems. Sensorium to light touch equal and intact throughout. Cranial nerves II through XII intact  Skin: Skin is warm and dry. No rash noted. He is not diaphoretic.  Psychiatric: His speech is normal.       Cooperative and appropriate    ED Course  Procedures (including critical care time)  Labs Reviewed  CBC - Abnormal; Notable for the following:    RBC 4.12 (*)    Hemoglobin 12.1 (*)    HCT 36.9 (*)    All other components within normal limits  COMPREHENSIVE METABOLIC PANEL - Abnormal; Notable for the following:    Glucose, Bld 101 (*)    All other components within normal limits  URINALYSIS, ROUTINE W REFLEX MICROSCOPIC - Abnormal; Notable for the following:    Ketones, ur 15 (*)    All other components within normal limits   Dg Chest 2 View  11/21/2011  *RADIOLOGY REPORT*  Clinical Data: Dizziness.  Shortness of  breath.  Lower chest pain for 12 hours.  History of pacemaker, bypass surgery, spinal surgery, hypertension, COPD.  CHEST - 2 VIEW  Comparison: 11/23/2010  Findings: The patient has a left-sided AICD.  Lead overlies the right ventricle.  Patient has had median sternotomy.  Patient has had prior cervical fusion and thoracolumbar fusion.  The heart is enlarged.  No edema.  There are no focal consolidations or pleural effusions.  IMPRESSION:  1.  Cardiomegaly. 2.  No edema or focal pulmonary abnormality.  Original Report Authenticated By: Patterson Hammersmith, M.D.   Ct Head Wo Contrast  11/21/2011  *RADIOLOGY REPORT*  Clinical Data: Headache and dizziness with fall.  CT HEAD WITHOUT CONTRAST  Technique:  Contiguous axial images were obtained from the base of the skull through the vertex without contrast.  Comparison: 07/12/2011  Findings: Mild cerebral atrophy.  Mild ventricular dilatation suggesting central atrophy.  No mass effect or midline shift.  No abnormal extra-axial fluid collections.  Gray-white matter junctions are distinct.  Basal cisterns are not effaced.  No evidence of acute intracranial hemorrhage.  No depressed skull fractures.  Visualized paranasal sinuses are not opacified.  No significant change since previous study.  IMPRESSION: No evidence of acute intracranial hemorrhage, mass lesion, or acute infarct.  Original Report Authenticated By: Marlon Pel, M.D.     1. Dizziness     Date: 11/21/2011  Rate: 71  Rhythm: normal sinus rhythm  QRS Axis: normal  Intervals: normal  ST/T Wave abnormalities: nonspecific ST/T changes  Conduction Disutrbances:right bundle branch block  Narrative Interpretation:   Old EKG Reviewed: unchanged    MDM   Dizziness with history of stroke and concern for the same. Labs and CT scan obtained and reviewed as above. Medicine consult for admit and further evaluation for possible CVA        Sunnie Nielsen, MD 11/21/11 [phone]

## 2011-11-22 ENCOUNTER — Inpatient Hospital Stay (HOSPITAL_COMMUNITY): Payer: Medicaid Other

## 2011-11-22 DIAGNOSIS — I369 Nonrheumatic tricuspid valve disorder, unspecified: Secondary | ICD-10-CM

## 2011-11-22 LAB — RPR: RPR Ser Ql: NONREACTIVE

## 2011-11-22 LAB — CARDIAC PANEL(CRET KIN+CKTOT+MB+TROPI)
CK, MB: 3.5 ng/mL (ref 0.3–4.0)
Relative Index: 2.1 (ref 0.0–2.5)
Troponin I: <0.3 ng/mL (ref <0.30)

## 2011-11-22 LAB — HEMOGLOBIN A1C
Hgb A1c MFr Bld: 5.6 % (ref <5.7)
Mean Plasma Glucose: 114 mg/dL (ref <117)

## 2011-11-22 LAB — VITAMIN B12: Vitamin B-12: 591 pg/mL (ref 211–911)

## 2011-11-22 LAB — LIPID PANEL
LDL Cholesterol: 73 mg/dL (ref 0–99)
Triglycerides: 95 mg/dL (ref <150)

## 2011-11-22 LAB — HIV ANTIBODY (ROUTINE TESTING W REFLEX): HIV: NONREACTIVE

## 2011-11-22 MED ORDER — PNEUMOCOCCAL VAC POLYVALENT 25 MCG/0.5ML IJ INJ
0.5000 mL | INJECTION | INTRAMUSCULAR | Status: AC
  Administered 2011-11-23: 0.5 mL via INTRAMUSCULAR
  Filled 2011-11-22: qty 0.5

## 2011-11-22 MED ORDER — LISINOPRIL 20 MG PO TABS
20.0000 mg | ORAL_TABLET | Freq: Every day | ORAL | Status: DC
  Administered 2011-11-23: 20 mg via ORAL
  Filled 2011-11-22: qty 1

## 2011-11-22 MED ORDER — OXYCODONE HCL 5 MG PO TABS
15.0000 mg | ORAL_TABLET | Freq: Four times a day (QID) | ORAL | Status: DC | PRN
  Administered 2011-11-22 – 2011-11-23 (×4): 15 mg via ORAL
  Filled 2011-11-22 (×4): qty 3

## 2011-11-22 MED ORDER — HYDROMORPHONE HCL 2 MG PO TABS
1.0000 mg | ORAL_TABLET | Freq: Four times a day (QID) | ORAL | Status: DC | PRN
  Administered 2011-11-22 (×2): 1 mg via ORAL
  Filled 2011-11-22 (×2): qty 1

## 2011-11-22 MED ORDER — DIAZEPAM 5 MG PO TABS
5.0000 mg | ORAL_TABLET | Freq: Two times a day (BID) | ORAL | Status: DC
  Administered 2011-11-22 – 2011-11-23 (×2): 5 mg via ORAL
  Filled 2011-11-22 (×2): qty 1

## 2011-11-22 MED ORDER — HYDRALAZINE HCL 20 MG/ML IJ SOLN
10.0000 mg | Freq: Four times a day (QID) | INTRAMUSCULAR | Status: DC | PRN
  Administered 2011-11-22: 10 mg via INTRAVENOUS
  Filled 2011-11-22: qty 0.5

## 2011-11-22 NOTE — Progress Notes (Signed)
[facility] PROGRESS NOTE    SUBJECTIVE   Pt reports significant low back and hip pain this am.  He denies any further episodes of slurred speech.  He has no other concerns or complaints this am.  No acute events per RN.  OBJECTIVE   Vital signs in last 24 hours: Temp:  [94.5 F (34.7 C)-98.4 F (36.9 C)] 97.2 F (36.2 C) (02/11 0530) Pulse Rate:  [54-80] 69  (02/11 0530) Resp:  [18-20] 19  (02/11 0530) BP: (120-169)/(60-95) 161/92 mmHg (02/11 0530) SpO2:  [91 %-98 %] 93 % (02/11 0746)  Past Medical History  Diagnosis Date  . Coronary artery disease     bypass grafting. Left internal mammary artery to left anterior descending   . Hypertension   . Myocardial infarction     hx of  . Hypercholesteremia   . Periodic limb movement disorder   . Hypersomnia, unspecified   . Syncope and collapse   . Tobacco use disorder   . Degeneration of lumbar or lumbosacral intervertebral disc   . Personal history of other diseases of digestive system   . Cocaine abuse     hx of   . Alcohol abuse     hx of  . Back pain, chronic   . Bipolar disorder, unspecified   . Aortocoronary bypass status 1996    surgery  . Defibrillator activation   . History of ventricular tachycardia      status post automatic implantable cardioverter defibrillator/pacer  . 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 MI (myocardial infarction) 1989 & 2006    with LV dysfunction  . AAA (abdominal aortic aneurysm)   . History of gastrointestinal bleeding     Neurologic Exam:  Mental Status: Patient is awake, alert, and oriented x 3.  Speech was clear with no evidence of aphasia.  Pt is able to follow commands. Cranial Nerves: III/IV/VI-Extraocular movements intact.  Pupils reactive bilaterally. V/VII-Smile symmetric VIII-grossly intact Motor: 5/5 bilaterally with normal tone and bulk Cerebellar: Normal  finger-to-nose  Studies/Results: Dg Chest 2 View  11/21/2011  *RADIOLOGY REPORT*  Clinical Data: Dizziness.  Shortness of breath.  Lower chest pain for 12 hours.  History of pacemaker, bypass surgery, spinal surgery, hypertension, COPD.  CHEST - 2 VIEW  Comparison: 11/23/2010  Findings: The patient has a left-sided AICD.  Lead overlies the right ventricle.  Patient has had median sternotomy.  Patient has had prior cervical fusion and thoracolumbar fusion.  The heart is enlarged.  No edema.  There are no focal consolidations or pleural effusions.  IMPRESSION:  1.  Cardiomegaly. 2.  No edema or focal pulmonary abnormality.  Original Report Authenticated By: Patterson Hammersmith, M.D.   Ct Head Wo Contrast  11/21/2011  *RADIOLOGY REPORT*  Clinical Data: Headache and dizziness with fall.  CT HEAD WITHOUT CONTRAST  Technique:  Contiguous axial images were obtained from the base of the skull through the vertex without contrast.  Comparison: 07/12/2011  Findings: Mild cerebral atrophy.  Mild ventricular dilatation suggesting central atrophy.  No mass effect or midline shift.  No abnormal extra-axial fluid collections.  Gray-white matter junctions are distinct.  Basal cisterns are not effaced.  No evidence of acute intracranial hemorrhage.  No depressed skull fractures.  Visualized paranasal sinuses are not opacified.  No significant change since previous study.  IMPRESSION: No evidence of acute intracranial hemorrhage, mass lesion, or acute infarct.  Original Report Authenticated By: Marlon Pel, M.D.    Medications:    I have reviewed the patient's current medications.  Assessment/Plan:   #Gait instability: CT head was negative for any evidence of hemorrhage, mass, or other acute/concerning CNS process.  The preliminary report from carotid doppler study suggests no significant stenosis. [name] does not have any focal deficit on neurologic exam.  It is highly unlikely that his complaints were the  result of acute stoke/TIA. He has an extensive history of substance abuse, including EtOH.  His impaired gait, recurrent falls, and report of dizziness may be adverse effects of long-term EtOH abuse in addition to adverse effects from narcotic used for management of his chronic pain. - Continue ASA - Will order B12, folate, RPR, and TSH - Consider checking HIV Ab given history and risk factors - Continue with PT - Will sign off and remain available for any questions  #Slurred speech: this is now resolved.  Patient's narcotic medications may have contributed to his slurred speech.   Nelda Bucks, PGY-3 [phone]  11/22/2011, 9:30 AM

## 2011-11-22 NOTE — Progress Notes (Signed)
PT Cancellation Note  Pt reporting pain in Lt. Hip area that will take 10-12 hours to calm down if he ambulates. Pt reports Lt. Hip pain arrived after a fall 2-3 days after return home after his last back surgery during which he hit his Lt. Hip, head, and flexed his back. Pt reports the pain in his Lt. Hip becomes unbearable with activity, reports it is a "totally separate" pain from his back. MD and RN notified.   Kelleen Stolze (Beverely Pace) Carleene Mains PT, DPT Acute Rehabilitation [phone]

## 2011-11-22 NOTE — Progress Notes (Signed)
OT Cancellation Note  Treatment cancelled today due to patient's refusal to participate due to 8/10 back pain--RN aware.  Will continue to attempt evaluation.  Evern Bio 11/22/2011, 12:03 PM

## 2011-11-22 NOTE — Progress Notes (Signed)
  Echocardiogram 2D Echocardiogram has been performed.  [name] [name] 11/22/2011, 10:35 AM

## 2011-11-22 NOTE — Progress Notes (Addendum)
Subjective: C/o L hip pain in addition to his chronic back pain- told PT it was new but told me he's had it on and off for several months  Objective: Vital signs in last 24 hours: Filed Vitals:   11/22/11 0530 11/22/11 0746 11/22/11 1036 11/22/11 1350  BP: 161/92  193/102 173/101  Pulse: 69  69 65  Temp: 97.2 F (36.2 C)  98.3 F (36.8 C) 98.3 F (36.8 C)  TempSrc: Oral  Oral Oral  Resp: 19  18 18   SpO2: 91% 93% 98% 98%   Weight change:   Intake/Output Summary (Last 24 hours) at 11/22/11 1507 Last data filed at 11/22/11 1150  Gross per 24 hour  Intake      0 ml  Output    850 ml  Net   -850 ml    Physical Exam: General: Awake, Oriented, No acute distress. HEENT: EOMI. Neck: Supple CV: S1 and S2 Lungs: Clear to ascultation bilaterally Abdomen: Soft, Nontender, Nondistended, +bowel sounds. Ext: Good pulses. Trace edema. Neuro: Left leg weaknees > R,  LE weaker then UE  Lab Results:  [facility] 11/21/11 0010  NA 140  K 3.9  CL 105  CO2 27  GLUCOSE 101*  BUN 10  CREATININE 0.80  CALCIUM 9.7  MG --  PHOS --    Basename 11/21/11 0010  AST 21  ALT 14  ALKPHOS 112  BILITOT 0.3  PROT 6.4  ALBUMIN 3.8     Basename 11/21/11 0010  WBC 5.5  NEUTROABS --  HGB 12.1*  HCT 36.9*  MCV 89.6  PLT 189        Basename 11/22/11 0151 11/21/11 0450  CHOL 131 144  HDL 39* 44  LDLCALC 73 79  TRIG 95 106  CHOLHDL 3.4 3.3  LDLDIRECT -- --        Studies/Results: Dg Chest 2 View  11/21/2011  *RADIOLOGY REPORT*  Clinical Data: Dizziness.  Shortness of breath.  Lower chest pain for 12 hours.  History of pacemaker, bypass surgery, spinal surgery, hypertension, COPD.  CHEST - 2 VIEW  Comparison: 11/23/2010  Findings: The patient has a left-sided AICD.  Lead overlies the right ventricle.  Patient has had median sternotomy.  Patient has had prior cervical fusion and thoracolumbar fusion.  The heart is enlarged.  No edema.  There are no focal consolidations or  pleural effusions.  IMPRESSION:  1.  Cardiomegaly. 2.  No edema or focal pulmonary abnormality.  Original Report Authenticated By: Patterson Hammersmith, M.D.   Ct Head Wo Contrast  11/21/2011  *RADIOLOGY REPORT*  Clinical Data: Headache and dizziness with fall.  CT HEAD WITHOUT CONTRAST  Technique:  Contiguous axial images were obtained from the base of the skull through the vertex without contrast.  Comparison: 07/12/2011  Findings: Mild cerebral atrophy.  Mild ventricular dilatation suggesting central atrophy.  No mass effect or midline shift.  No abnormal extra-axial fluid collections.  Gray-white matter junctions are distinct.  Basal cisterns are not effaced.  No evidence of acute intracranial hemorrhage.  No depressed skull fractures.  Visualized paranasal sinuses are not opacified.  No significant change since previous study.  IMPRESSION: No evidence of acute intracranial hemorrhage, mass lesion, or acute infarct.  Original Report Authenticated By: Marlon Pel, M.D.    Medications: I have reviewed the patient's current medications. Scheduled Meds:    . aspirin  300 mg Rectal Daily   Or  . aspirin  325 mg Oral Daily  . diazepam  5  mg Oral BID  . enoxaparin  40 mg Subcutaneous Q24H  . fluticasone  2 puff Inhalation BID  . lisinopril  10 mg Oral Daily  . metoprolol  50 mg Oral BID  . pantoprazole  40 mg Oral Q1200  . simvastatin  20 mg Oral q1800  . tiotropium  18 mcg Inhalation Daily  . DISCONTD: diazepam  10 mg Oral BID  . DISCONTD: oxycodone  15 mg Oral Q4H   Continuous Infusions:    . sodium chloride 125 mL/hr at 11/21/11 0029   PRN Meds:.acetaminophen, albuterol, cyclobenzaprine, HYDROmorphone, nitroGLYCERIN, ondansetron (ZOFRAN) IV, oxycodone, senna-docusate, zolpidem, DISCONTD: HYDROmorphone  Assessment/Plan: Principal Problem:  1. Ataxia/falls/slurred speech- CVA w/up, neuro consulted, No MRI secondary to AICD and pt is allergic to IV contrast dye- patient says this  is normal for him except now he has numbness in B/L hands so probably not a CVA/TIA, patient poor historian but wanting to go home, will wean medications as most like ly this is the cause of his issues  2. Chest pain- CE negative   3. HYPERCHOLESTEROLEMIA- zocor  4.  BIPOLAR DISORDER UNSPECIFIED   5. TOBACCO ABUSE- encourage cessation   6. HYPERTENSION- add prn hydralazine and increased lisinopril continue metoprolol   7.  BACK PAIN, CHRONIC- plan for him to have surgery in 7 weeks per pt  8. L hip pain- check x rays and then PT will work with  9. EF 30-35%- pt on ACE, encouraged to stop alcohol  Patient is very poor historian  Hope for D/C tomm       LOS: 2 days  Senaya Dicenso, DO 11/22/2011, 3:07 PM

## 2011-11-22 NOTE — Progress Notes (Signed)
Utilization review complete 

## 2011-11-22 NOTE — Evaluation (Signed)
Speech Language Pathology Evaluation Patient Details Name: [name] MRN: [id number] DOB: 1951/04/14 Today's Date: 11/22/2011  GENERAL:  61 yr old admitted with dizziness, slurred speech, difficulty walking.  See history below.  CT negative for new event.  MD not suspicious of CVA at this time.  Problem List:  Patient Active Problem List  Diagnoses  . HYPERCHOLESTEROLEMIA  . BIPOLAR DISORDER UNSPECIFIED  . TOBACCO ABUSE  . Periodic limb movement disorder  . HYPERTENSION  . MYOCARDIAL INFARCTION, HX OF  . CAD  . DEGENERATIVE DISC DISEASE, LUMBAR SPINE  . BACK PAIN, CHRONIC  . HYPERSOMNIA  . Ataxia  . Dysarthria  . Vertigo  . Chest pain   Past Medical History:  Past Medical History  Diagnosis Date  . Coronary artery disease     bypass grafting. Left internal mammary artery to left anterior descending   . Hypertension   . Myocardial infarction     hx of  . Hypercholesteremia   . Periodic limb movement disorder   . Hypersomnia, unspecified   . Syncope and collapse   . Tobacco use disorder   . Degeneration of lumbar or lumbosacral intervertebral disc   . Personal history of other diseases of digestive system   . Cocaine abuse     hx of   . Alcohol abuse     hx of  . Back pain, chronic   . Bipolar disorder, unspecified   . Aortocoronary bypass status 1996    surgery  . Defibrillator activation   . History of ventricular tachycardia      status post automatic implantable cardioverter defibrillator/pacer  . 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 MI (myocardial infarction) 1989 & 2006    with LV dysfunction  . AAA (abdominal aortic aneurysm)   . History of gastrointestinal bleeding    Past Surgical History:  Past Surgical History  Procedure Date  . Aortocoronary byp   . Icd pla   . Cardiac defibrillator placement 10/23/2007     [employer]tronic Maximo single chamber cardioverter-defibrillator  . Coronary artery  bypass graft 2007    in [place], [place] -- LIMA to LAD, SVG to diagonal, SVG to RCA, SVG to circumflex (saphenous vein graft to circumflex totaled at catheterization in 2006)  . Cardiac catheterization 05/14/2006    Est EF of over 50%  . Cardiac catheterization     Est EF of 50% --   . Cardiac catheterization 09/03/2008    Continued patency of the internal mammary to the LAD -- of the saphenous vein graft to the diagonal -- of the saphenous vein graft to PDA -- Occluded saphenous vein graft to the circumflex with essentially widely patent circumflex vessel  . Lumbar fusion     L5-S1 transforaminal lumbar interbody fusion with contralateral posterolateral fusion at the same level -- Posterolateral fusion T10-11, T11-12, T12-L1, L1-L2, L2-L3, L3-L4, L4-L5 --   . Lumbar laminectomy/decompression microdiscectomy 11/26/2010    Laminectomy with decompression of the spinal cord C3-4, C4-5, C5-6,C6-7    SLP Assessment/Plan/Recommendation Assessment Clinical Impression Statement: Pt. appeared functional for items assessed.  Pt. has history of polysubstance abuse and old CVA, therefore, may have baseline deficits with judgement, reasoning.  Speech is intelligible.  No ST recommended at this time.  SLP Recommendation/Assessment: Patient does not need any further Speech Lanaguage Pathology Services No Skilled Speech Therapy: Patient at baseline level of functioning;All education completed SLP Recommendations Follow up Recommendations: None  Equipment Recommended: Other (comment) (to be determined) Individuals Consulted Consulted and Agree with Results and Recommendations: Patient  SLP Evaluation Prior Functioning Cognitive/Linguistic Baseline:  (Suspect functional for pt. environment. 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 Overall Cognitive Status: Appears within functional limits for tasks  assessed Arousal/Alertness: Awake/alert Orientation Level: Oriented X4 Attention:  (sustained WFL) Memory: Appears intact Awareness: Appears intact (for basic info) Problem Solving:  (for basic) Safety/Judgment:  (appeares functional)  Comprehension Auditory Comprehension Overall Auditory Comprehension: Appears within functional limits for tasks assessed Yes/No Questions: Not tested Commands: Within Functional Limits Conversation: Simple Interfering Components: Pain Visual Recognition/Discrimination Discrimination: Not tested Reading Comprehension Reading Status: Not tested  Expression Expression Primary Mode of Expression: Verbal Verbal Expression Overall Verbal Expression: Appears within functional limits for tasks assessed Initiation: No impairment Level of Generative/Spontaneous Verbalization: Conversation Repetition: No impairment Naming: Not tested Pragmatics: No impairment Non-Verbal Means of Communication: Not applicable Written Expression Written Expression: Not tested  Oral/Motor Oral Motor/Sensory Function Overall Oral Motor/Sensory Function: Appears within functional limits for tasks assessed Motor Speech Overall Motor Speech: Appears within functional limits for tasks assessed Respiration: Within functional limits Phonation: Normal Resonance: Within functional limits Articulation: Within functional limitis Intelligibility: Intelligible Motor Planning: Witnin functional limits Motor Speech Errors: Not applicable  Royce Macadamia M.Ed [employer] [phone]  11/22/2011

## 2011-11-23 LAB — FOLATE RBC: RBC Folate: 600 ng/mL (ref >=366)

## 2011-11-23 MED ORDER — LISINOPRIL 40 MG PO TABS: 40.0000 mg | ORAL_TABLET | Freq: Every day | ORAL | Status: DC

## 2011-11-23 MED ORDER — UNABLE TO FIND: Status: DC

## 2011-11-23 MED ORDER — DIAZEPAM 5 MG PO TABS: 5.0000 mg | ORAL_TABLET | Freq: Two times a day (BID) | ORAL | Status: AC

## 2011-11-23 MED ORDER — OXYCODONE HCL 15 MG PO TABS: 15.0000 mg | ORAL_TABLET | Freq: Four times a day (QID) | ORAL | Status: AC | PRN

## 2011-11-23 NOTE — Discharge Summary (Signed)
Discharge Summary  [name] MR#: [id number]  DOB:May 15, 1951  Date of Admission: 11/20/2011 Date of Discharge: 11/23/2011  Patient's PCP: No primary provider on file.  Attending Physician:Lorrene Graef  Consults: Neurology  Discharge Diagnoses: Principal Problem:  *Ataxia Active Problems:  HYPERCHOLESTEROLEMIA  BIPOLAR DISORDER UNSPECIFIED  TOBACCO ABUSE  HYPERTENSION  CAD  DEGENERATIVE DISC DISEASE, LUMBAR SPINE  BACK PAIN, CHRONIC  HYPERSOMNIA  Dysarthria  Vertigo  Chest pain   Brief Admitting History and Physical [name] is an 61 y.o. male with a history of CAD, and Chronic Pain who presents to the ED with complaints of slurring of his speech dizziness, and difficulty walking since the AM upon awakening. He reports getting up from bed and falling to the floor. He denied having weakness in any particular side. He reports also having chest pain and a Headache, He states the symptoms have not improved at all. 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    Discharge Medications Medication List  As of 11/23/2011  4:49 PM   TAKE these medications         albuterol (2.5 MG/3ML) 0.083% nebulizer solution   Commonly known as: PROVENTIL   Take 2.5 mg by nebulization every 6 (six) hours as needed. For shortness of breath.      aspirin 81 MG tablet   Take 81 mg by mouth daily.      beclomethasone 40 MCG/ACT inhaler   Commonly known as: QVAR   Inhale 2 puffs into the lungs daily.      cyclobenzaprine 10 MG tablet   Commonly known as: FLEXERIL   Take 10 mg by mouth 3 (three) times daily as needed. For muscle spasms.      diazepam 5 MG tablet   Commonly known as: VALIUM   Take 1 tablet (5 mg total) by mouth 2 (two) times daily.      EXALGO 32 MG Tb24   Generic drug: HYDROmorphone HCl   Take 1 tablet by mouth daily.      lisinopril 40 MG tablet   Commonly  known as: PRINIVIL,ZESTRIL   Take 1 tablet (40 mg total) by mouth daily.      metoprolol 50 MG tablet   Commonly known as: LOPRESSOR   Take 50 mg by mouth 2 (two) times daily.      nitroGLYCERIN 0.4 MG SL tablet   Commonly known as: NITROSTAT   Place 0.4 mg under the tongue every 5 (five) minutes x 3 doses as needed. For chest pain.      omeprazole 20 MG capsule   Commonly known as: PRILOSEC   Take 20 mg by mouth daily.      oxyCODONE 15 MG immediate release tablet   Commonly known as: ROXICODONE   Take 1 tablet (15 mg total) by mouth every 6 (six) hours as needed.      simvastatin 20 MG tablet   Commonly known as: ZOCOR   Take 20 mg by mouth at bedtime.      tiotropium 18 MCG inhalation capsule   Commonly known as: SPIRIVA   Place 18 mcg into inhaler and inhale daily.            Hospital Course:  .Ataxia- found to be multifactorial, probably drug interaction with know back problems needing surgery, have decreased pain meds and any sedating meds .HYPERCHOLESTEROLEMIA- cont medications .  TOBACCO ABUSE- encourage cessation .CAD- follow up with cardiology .DEGENERATIVE DISC DISEASE, LUMBAR SPINE- neurosurgery .BACK PAIN, CHRONIC- decrease pain meds .Chest pain- reosolved .HYPERTENSION-  Increased lisinopril- will need to BP as outpt   Day of Discharge BP 138/78  Pulse 65  Temp(Src) 98.6 F (37 C) (Oral)  Resp 18  Ht 5\' 11"  (1.803 m)  Wt 70.3 kg (154 lb 15.7 oz)  BMI 21.62 kg/m2  SpO2 96%  Results for orders placed during the hospital encounter of 11/20/11 (from the past 48 hour(s))  CARDIAC PANEL(CRET KIN+CKTOT+MB+TROPI)     Status: Abnormal   Collection Time   11/21/11  4:59 PM      Component Value Range Comment   Total CK 242 (*) 7 - 232 (U/L)    CK, MB 4.5 (*) 0.3 - 4.0 (ng/mL)    Troponin I <0.30  <0.30 (ng/mL)    Relative Index 1.9  0.0 - 2.5    CARDIAC PANEL(CRET KIN+CKTOT+MB+TROPI)     Status: Normal   Collection Time   11/22/11  1:47 AM       Component Value Range Comment   Total CK 170  7 - 232 (U/L)    CK, MB 3.5  0.3 - 4.0 (ng/mL)    Troponin I <0.30  <0.30 (ng/mL)    Relative Index 2.1  0.0 - 2.5    HEMOGLOBIN A1C     Status: Normal   Collection Time   11/22/11  1:51 AM      Component Value Range Comment   Hemoglobin A1C 5.6  <5.7 (%)    Mean Plasma Glucose 114  <117 (mg/dL)   LIPID PANEL     Status: Abnormal   Collection Time   11/22/11  1:51 AM      Component Value Range Comment   Cholesterol 131  0 - 200 (mg/dL)    Triglycerides 95  <161 (mg/dL)    HDL 39 (*) >09 (mg/dL)    Total CHOL/HDL Ratio 3.4      VLDL 19  0 - 40 (mg/dL)    LDL Cholesterol 73  0 - 99 (mg/dL)   RPR     Status: Normal   Collection Time   11/22/11 10:00 AM      Component Value Range Comment   RPR NON REACTIVE  NON REACTIVE    TSH     Status: Normal   Collection Time   11/22/11 10:00 AM      Component Value Range Comment   TSH 0.586  0.350 - 4.500 (uIU/mL)   VITAMIN B12     Status: Normal   Collection Time   11/22/11 10:00 AM      Component Value Range Comment   Vitamin B-12 591  211 - 911 (pg/mL)   FOLATE RBC     Status: Normal   Collection Time   11/22/11 10:00 AM      Component Value Range Comment   RBC Folate 600  >=366 (ng/mL) Reference range not established for pediatric patients.  HIV ANTIBODY (ROUTINE TESTING)     Status: Normal   Collection Time   11/22/11 10:00 AM      Component Value Range Comment   HIV NON REACTIVE  NON REACTIVE      Dg Chest 2 View  11/21/2011  *RADIOLOGY REPORT*  Clinical Data: Dizziness.  Shortness of breath.  Lower chest pain for 12 hours.  History of pacemaker, bypass surgery, spinal surgery, hypertension, COPD.  CHEST - 2 VIEW  Comparison:  11/23/2010  Findings: The patient has a left-sided AICD.  Lead overlies the right ventricle.  Patient has had median sternotomy.  Patient has had prior cervical fusion and thoracolumbar fusion.  The heart is enlarged.  No edema.  There are no focal consolidations or  pleural effusions.  IMPRESSION:  1.  Cardiomegaly. 2.  No edema or focal pulmonary abnormality.  Original Report Authenticated By: Patterson Hammersmith, M.D.   Dg Hip Complete Left  11/22/2011  *RADIOLOGY REPORT*  Clinical Data: Fall with left hip pain.  LEFT HIP - COMPLETE 2+ VIEW  Comparison: 11/11/2010.  Findings: No acute osseous or joint abnormality.  Obturator rings are intact.  Postoperative changes are seen in the spine.  IMPRESSION: No acute findings.  Original Report Authenticated By: Reyes Ivan, M.D.   Ct Head Wo Contrast  11/21/2011  *RADIOLOGY REPORT*  Clinical Data: Headache and dizziness with fall.  CT HEAD WITHOUT CONTRAST  Technique:  Contiguous axial images were obtained from the base of the skull through the vertex without contrast.  Comparison: 07/12/2011  Findings: Mild cerebral atrophy.  Mild ventricular dilatation suggesting central atrophy.  No mass effect or midline shift.  No abnormal extra-axial fluid collections.  Gray-white matter junctions are distinct.  Basal cisterns are not effaced.  No evidence of acute intracranial hemorrhage.  No depressed skull fractures.  Visualized paranasal sinuses are not opacified.  No significant change since previous study.  IMPRESSION: No evidence of acute intracranial hemorrhage, mass lesion, or acute infarct.  Original Report Authenticated By: Marlon Pel, M.D.  Transthoracic Echocardiography   ECHO:  ------------------------------------------------------------ LV EF: 30% - 35%  ------------------------------------------------------------ Indications: CVA 436.  ------------------------------------------------------------ History: PMH: Coronary artery disease. Risk factors: Vertigo. History of MI. Degenerative disc disease, lumbar spine. Current tobacco use. Hypertension. Dyslipidemia.  ------------------------------------------------------------ Study Conclusions  - Left ventricle: The cavity size was mildly  dilated. Wall thickness was increased in a pattern of mild LVH. Systolic function was moderately to severely reduced. The estimated ejection fraction was in the range of 30% to 35%. Posterior and inferior akinesis. Anterolateral hypokinesis. Features are consistent with a pseudonormal left ventricular filling pattern, with concomitant abnormal relaxation and increased filling pressure (grade 2 diastolic dysfunction). E/medial e' > 15 suggests LV end diastolic pressure at least 20 mmHg. - Aortic valve: There was no stenosis. Trivial regurgitation. - Aorta: Mildly dilated aortic root. - Mitral valve: Mild regurgitation. - Left atrium: The atrium was mildly dilated. - Right ventricle: The cavity size was normal. Pacer wire or catheter noted in right ventricle. Systolic function was mildly to moderately reduced. - Right atrium: The atrium was mildly dilated. - Tricuspid valve: Moderate regurgitation. Peak RV-RA gradient:88mm Hg (S). - Pulmonary arteries: PA systolic pressure 56-60 mmHg. - Systemic veins: IVC measured 2.9 cm with some respirophasic variation, suggesting RA pressure 16-20 mmHg. Impressions:  - Mildly dilated LV with moderate to severe systolic dysfunction, EF 30-35%. Mild LV hypertrophy. Moderate diastolic dysfunction with evidence for elevated LV filling pressure. Wall motion abnormalities as noted above. The RV was normal in size with mild to moderate systolic dysfunction. The IVC was dilated suggestive of elevated RV filling pressure. Moderate pulmonary hypertension. Transthoracic echocardiography. M-mode, complete 2D, spectral Doppler, and color Doppler. Height: Height: 180.3cm. Height: 71in. Weight: Weight: 68.2kg. Weight: 150lb. Body mass index: BMI: 21kg/m^2. Body surface area: BSA: 1.73m^2. Blood pressure: 161/92. Patient status: Inpatient. Location:  Bedside.  ------------------------------------------------------------  ------------------------------------------------------------ Left ventricle: The cavity size was mildly dilated. Wall thickness was increased in  a pattern of mild LVH. Systolic function was moderately to severely reduced. The estimated ejection fraction was in the range of 30% to 35%. Posterior and inferior akinesis. Anterolateral hypokinesis. Features are consistent with a pseudonormal left ventricular filling pattern, with concomitant abnormal relaxation and increased filling pressure (grade 2 diastolic dysfunction). E/medial e' > 15 suggests LV end diastolic pressure at least 20 mmHg.  ------------------------------------------------------------ Aortic valve: Trileaflet; mildly calcified leaflets. Doppler: There was no stenosis. Trivial regurgitation.  ------------------------------------------------------------ Aorta: Mildly dilated aortic root.  ------------------------------------------------------------ Mitral valve: Doppler: There was no evidence for stenosis. Mild regurgitation. Peak gradient: 4mm Hg (D).  ------------------------------------------------------------ Left atrium: The atrium was mildly dilated.  ------------------------------------------------------------ Right ventricle: The cavity size was normal. Pacer wire or catheter noted in right ventricle. Systolic function was mildly to moderately reduced.  ------------------------------------------------------------ Pulmonic valve: Doppler: Trivial regurgitation.  ------------------------------------------------------------ Tricuspid valve: Doppler: Moderate regurgitation.  ------------------------------------------------------------ Pulmonary artery: PA systolic pressure 56-60 mmHg.  ------------------------------------------------------------ Right atrium: The atrium was mildly  dilated.  ------------------------------------------------------------ Pericardium: There was no pericardial effusion.  ------------------------------------------------------------ Systemic veins: IVC measured 2.9 cm with some respirophasic variation, suggesting RA pressure 16-20 mmHg.  ------------------------------------------------------------ Post procedure conclusions Ascending Aorta:  - Mildly dilated aortic root.  ------------------------------------------------------------  2D measurements Normal Doppler Normal Left ventricle measurements LVID ED, 54.7 mm 43-52 Left ventricle chord, Ea, lat 3.51 cm/ ------- PLAX ann, tiss s LVPW, ED 12.7 mm ------ DP IVS/LVPW 1.15 <1.3 E/Ea, lat 29.06 ------- ratio, ED ann, tiss Ventricular septum DP IVS, ED 14.6 mm ------ Ea, med 4.39 cm/ ------- Aorta ann, tiss s Root diam, 40 mm ------ DP ED E/Ea, med 23.23 ------- Left atrium ann, tiss AP dim 36 mm ------ DP AP dim 1.95 cm/m^2 <2.2 Mitral valve index Peak E vel 102 cm/ ------- s Peak A vel 66.7 cm/ ------- s Deceleratio 225 ms 150-230 n time Peak 4 mm ------- gradient, D Hg Peak E/A 1.5 ------- ratio Tricuspid valve Peak RV-RA 36 mm ------- gradient, S Hg Right ventricle Sa vel, lat 8.97 cm/ ------- ann, tiss s DP Pulmonic valve Regurg vel, 139 cm/ ------- ED s  ------------------------------------------------------------ Prepared and Electronically Authenticated by  Marca Ancona, MD 2013-02-11T13:10:58.510    Disposition: home with 24 hour supervision  Diet: cardiac  Activity: per neurosurgeon   Follow-up Appts: PCP Dr. Orvan Falconer- 1 week Discharge Orders    Future Appointments: Provider: Department: Dept Phone: Center:   12/29/2011 2:00 PM Vella Kohler [facility] [place] [phone] [facility]     Future Orders Please Complete By Expires   Diet - low sodium heart healthy      Increase activity slowly      Scheduling Instructions:   Per  neurosurgeon   Discharge instructions      Comments:   Use rolling walker as instructed by physical therapy Check BP at home and bring readings to PCP      Time spent on discharge, talking to the patient, and coordinating care: 33 mins.   SignedMarlin Canary, DO 11/23/2011, 4:49 PM

## 2011-11-23 NOTE — Progress Notes (Signed)
Patient much more awake since we have decreased his pain meds.  He has arranged 24 hour care with his daughter and ex wife. He is refusing SNF. He declines HH at this time.  He says he will get it after his surgery in 7 weeks.  Will use his rolling walker. Marlin Canary D.O.

## 2011-11-23 NOTE — Progress Notes (Signed)
   CARE MANAGEMENT NOTE 11/23/2011  Patient:  [name], [name]   Account Number:  [id number]  Date Initiated:  11/22/2011  Documentation initiated by:  Donn Pierini  Subjective/Objective Assessment:   Pt admitted with slurred speech, dizziness, difficulty walking     Action/Plan:   PT/OT evals   Anticipated DC Date:  11/23/2011   Anticipated DC Plan:  HOME/SELF CARE      DC Planning Services  CM consult      Choice offered to / List presented to:             Status of service:  Completed, signed off Medicare Important Message given?   (If response is "NO", the following Medicare IM given date fields will be blank) Date Medicare IM given:   Date Additional Medicare IM given:    Discharge Disposition:  HOME/SELF CARE  Per UR Regulation:    Comments:  11/23/11- Donn Pierini RN, BSN [phone] Pt for possible d/c today, medications through medicaid benefits, no discharge needs.

## 2011-11-23 NOTE — Progress Notes (Signed)
Physical Therapy Treatment Patient Details Name: [name] MRN: [id number] DOB: 1950-12-01 Today's Date: 11/23/2011  PT Assessment/Plan  PT - Assessment/Plan Comments on Treatment Session: 61 y.o. male admitted secondary to several falls at home. Pt continues to have decreased stability with gait in addition to decreased safety awareness placing him at a high risk for falling again. This was explained to pt along with consequences of falling. Educated pt on need to use RW and not rollator if he goes home for improved safety. Also educated pt on need for 24 hour supervision - pt reports he will try to get 24 hour supervision at home between his daughter and ex-wife however is refusing SNF. Pt's spinal surgeon should be consulted prior to ordering HHPT however. PT Plan: Discharge plan remains appropriate PT Frequency: Min 3X/week Follow Up Recommendations: Home health PT;Skilled nursing facility;Supervision/Assistance - 24 hour (SNF most appropriate D/C plan) Equipment Recommended: None recommended by PT PT Goals  Acute Rehab PT Goals PT Goal Formulation: With patient Time For Goal Achievement: 2 weeks Pt will go Sit to Stand: with modified independence PT Goal: Sit to Stand - Progress: Progressing toward goal Pt will go Stand to Sit: with modified independence PT Goal: Stand to Sit - Progress: Progressing toward goal Pt will Ambulate: 51 - 150 feet;with supervision;with least restrictive assistive device PT Goal: Ambulate - Progress: Progressing toward goal Pt will Go Up / Down Stairs: 3-5 stairs;with supervision;with least restrictive assistive device PT Goal: Up/Down Stairs - Progress: Progressing toward goal Additional Goals Additional Goal #1: Score >/= 45/56 on Berg Balance Test PT Goal: Additional Goal #1 - Progress: Progressing toward goal  PT Treatment Precautions/Restrictions  Precautions Precautions: Fall Required Braces or Orthoses: No Restrictions Weight Bearing  Restrictions: No Mobility (including Balance) Bed Mobility Bed Mobility: Yes Rolling Right: 6: Modified independent (Device/Increase time) Right Sidelying to Sit: 6: Modified independent (Device/Increase time) Transfers Transfers: Yes Sit to Stand: From bed;5: Supervision Sit to Stand Details (indicate cue type and reason): supervision for safety, pt performing prior to being cued to perform.  Stand to Sit: Other (comment);With armrests;To chair/3-in-1 (min-guard assist) Stand to Sit Details: Verbal cues to get fully square with chair prior to sitting.  Ambulation/Gait Ambulation/Gait: Yes Ambulation/Gait Assistance: 4: Min assist Ambulation/Gait Assistance Details (indicate cue type and reason): Pt with generalized instability and takes hands off of RW at times demonstrating decreased safety awareness. Pt allows RW to get very far ahead of self - requires verbal cues to correct. Pt with no c/o Lt. hip pain today. No spasms noted. Ambulation Distance (Feet): 110 Feet Assistive device: Rolling walker Gait Pattern: Step-through pattern;Decreased stride length;Trunk flexed (exessive hip flexion, decreased foot clearance bilaterally. ) Stairs: No    Exercise  General Exercises - Lower Extremity Ankle Circles/Pumps: AROM;Both;15 reps;Seated Quad Sets: AROM;Both;15 reps;Seated Gluteal Sets: AROM;Both;5 reps;Seated End of Session PT - End of Session Equipment Utilized During Treatment: Gait belt Activity Tolerance: Patient limited by fatigue Patient left: in chair;with call bell in reach General Behavior During Session: [facility] for tasks performed Cognition: Impaired Cognitive Impairment: Decreased safety awareness, some impulsiveness which increases risk for falls.   Sherie Don 11/23/2011, 3:47 PM  Sherie Don) Carleene Mains PT, DPT Acute Rehabilitation [phone]

## 2011-11-23 NOTE — Evaluation (Signed)
Occupational Therapy Evaluation Patient Details Name: [name] MRN: [id number] DOB: 04/05/1951 Today's Date: 11/23/2011  Problem List:  Patient Active Problem List  Diagnoses  . HYPERCHOLESTEROLEMIA  . BIPOLAR DISORDER UNSPECIFIED  . TOBACCO ABUSE  . Periodic limb movement disorder  . HYPERTENSION  . MYOCARDIAL INFARCTION, HX OF  . CAD  . DEGENERATIVE DISC DISEASE, LUMBAR SPINE  . BACK PAIN, CHRONIC  . HYPERSOMNIA  . Ataxia  . Dysarthria  . Vertigo  . Chest pain    Past Medical History:  Past Medical History  Diagnosis Date  . Coronary artery disease     bypass grafting. Left internal mammary artery to left anterior descending   . Hypertension   . Myocardial infarction     hx of  . Hypercholesteremia   . Periodic limb movement disorder   . Hypersomnia, unspecified   . Syncope and collapse   . Tobacco use disorder   . Degeneration of lumbar or lumbosacral intervertebral disc   . Personal history of other diseases of digestive system   . Cocaine abuse     hx of   . Alcohol abuse     hx of  . Back pain, chronic   . Bipolar disorder, unspecified   . Aortocoronary bypass status 1996    surgery  . Defibrillator activation   . History of ventricular tachycardia      status post automatic implantable cardioverter defibrillator/pacer  . 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 MI (myocardial infarction) 1989 & 2006    with LV dysfunction  . AAA (abdominal aortic aneurysm)   . History of gastrointestinal bleeding    Past Surgical History:  Past Surgical History  Procedure Date  . Aortocoronary byp   . Icd pla   . Cardiac defibrillator placement 10/23/2007     Medtronic Maximo single chamber cardioverter-defibrillator  . Coronary artery bypass graft 2007    in [place], [place] -- LIMA to LAD, SVG to diagonal, SVG to RCA, SVG to circumflex (saphenous vein graft to circumflex totaled at catheterization in  2006)  . Cardiac catheterization 05/14/2006    Est EF of over 50%  . Cardiac catheterization     Est EF of 50% --   . Cardiac catheterization 09/03/2008    Continued patency of the internal mammary to the LAD -- of the saphenous vein graft to the diagonal -- of the saphenous vein graft to PDA -- Occluded saphenous vein graft to the circumflex with essentially widely patent circumflex vessel  . Lumbar fusion     L5-S1 transforaminal lumbar interbody fusion with contralateral posterolateral fusion at the same level -- Posterolateral fusion T10-11, T11-12, T12-L1, L1-L2, L2-L3, L3-L4, L4-L5 --   . Lumbar laminectomy/decompression microdiscectomy 11/26/2010    Laminectomy with decompression of the spinal cord C3-4, C4-5, C5-6,C6-7    OT Assessment/Plan/Recommendation OT Assessment Clinical Impression Statement: This 61 y.o. male admitted for falls, dizziness, and slurred speech.  Pt. reports to OT that he has an ongoing, extensive h/o falls that he relates to his back issues.  He reports numbness and tingling of fingertips and states he feels this is possibly due to a "neck issue".  Pt. reports he had been referred to OP for therapy, but that his pain MD had instructed him not to go until after he has surgery in 7 weeks.   Pt. demonstrates impaired safety and judement; impaired problem solving, impaired balance.  He indicated  to PT that he has 24 hour supervision/assist at home, but during OT eval he reports he is home alone from 10-7 while ex-wife works.  Due significant history of falls, and lack of 24 hour assist, feel he would be best served in SNF level rehab; however, am doubtful pt. will agree.   If pt. does discharge to home, recommend HHOT OT Recommendation/Assessment: Patient will need skilled OT in the acute care venue OT Problem List: Decreased strength;Decreased activity tolerance;Impaired balance (sitting and/or standing);Decreased cognition;Decreased safety awareness;Decreased knowledge  of use of DME or AE;Impaired sensation;Pain Barriers to Discharge: Decreased caregiver support OT Therapy Diagnosis : Generalized weakness;Acute pain;Cognitive deficits OT Plan OT Frequency: Min 2X/week OT Treatment/Interventions: Self-care/ADL training;DME and/or AE instruction;Therapeutic activities;Patient/family education;Balance training OT Recommendation Follow Up Recommendations: Skilled nursing facility Equipment Recommended: None recommended by OT Individuals Consulted Consulted and Agree with Results and Recommendations: Patient OT Goals Acute Rehab OT Goals OT Goal Formulation: With patient Time For Goal Achievement: 2 weeks ADL Goals Pt Will Perform Grooming: with supervision;Standing at sink ADL Goal: Grooming - Progress: Goal set today Pt Will Perform Lower Body Bathing: with min assist;Sit to stand from bed;Sit to stand from chair ADL Goal: Lower Body Bathing - Progress: Goal set today Pt Will Perform Lower Body Dressing: with supervision;Sit to stand from chair;Sit to stand from bed ADL Goal: Lower Body Dressing - Progress: Goal set today Pt Will Transfer to Toilet: with supervision;Ambulation ADL Goal: Toilet Transfer - Progress: Goal set today Pt Will Perform Toileting - Clothing Manipulation: with supervision;Standing ADL Goal: Toileting - Clothing Manipulation - Progress: Progressing toward goals  OT Evaluation Precautions/Restrictions  Restrictions Weight Bearing Restrictions: No Prior Functioning Home Living Lives With: Spouse (Pt reports ex-wife) Receives Help From: Family Type of Home: House Home Layout: One level Home Access: Stairs to enter Entrance Stairs-Rails: None Entrance Stairs-Number of Steps: 3 Bathroom Shower/Tub: Child psychotherapist: Standard Bathroom Accessibility: No Home Adaptive Equipment: Shower chair with back;Wheelchair - [employer] - four wheeled;Bedside commode/3-in-1 Additional Comments: Pt. reports that  ex-wife works 10-7 daily and that he is alone during the day Prior Function Level of Independence: Requires assistive device for independence Driving: Yes Vocation: On disability Comments: Pt. reports he takes sponge baths due to frequent falls in the tub/shower.  Pt. reports h/o frequent falls PTA ADL ADL Eating/Feeding: Performed;Independent Where Assessed - Eating/Feeding: Edge of bed Grooming: Simulated;Wash/dry hands;Teeth care;Wash/dry face;Minimal assistance (min guard assist) Where Assessed - Grooming: Standing at sink Upper Body Bathing: Simulated;Set up Where Assessed - Upper Body Bathing: Sitting, bed;Unsupported Lower Body Bathing: Simulated;Minimal assistance (min guard) Where Assessed - Lower Body Bathing: Sit to stand from bed Upper Body Dressing: Simulated;Set up Where Assessed - Upper Body Dressing: Unsupported;Sitting, bed Lower Body Dressing: Simulated;Minimal assistance (min guard assist.  Pt. able to access bil. feet for socks) Where Assessed - Lower Body Dressing: Sit to stand from bed Toilet Transfer: Simulated;Minimal assistance (min guard assist) Toilet Transfer Method: Ambulating Toilet Transfer Equipment: Comfort height toilet Toileting - Clothing Manipulation: Simulated;Minimal assistance (min guard assist) Where Assessed - Toileting Clothing Manipulation: Sit to stand from 3-in-1 or toilet Toileting - Hygiene: Simulated;Modified independent Where Assessed - Toileting Hygiene: Sit on 3-in-1 or toilet Equipment Used: Rolling walker Ambulation Related to ADLs: Pt. ambulated in room with min guard assist.  Pt. requires min verbal cues for hand placement when moving sit to stand - pulls up on walker.  Pt. pushing down hard on walker when ambulating, and ambulation slow and deliberate.  ADL Comments: Pt. requires min guard assist for LB ADLs due to h/o frequent falls.  No LOB noted during OT eval.  Pt. is eager to return home.  States that he doesn't feel there is  anything that can be done for him in hospital.  He doesn't feel status, nor balance have improved since being in hospital, and that he is positive that his pain medications are not contributing to his falls.  Pt. also states that he was approved for OP therapy, but that his pain MD told him not to go until after he has surgery in 7 weeks due to increased risk of paralysis.   Vision/Perception  Vision - History Patient Visual Report: No change from baseline Perception Perception: Within Functional Limits Praxis Praxis: Intact Cognition Cognition Arousal/Alertness: Awake/alert Overall Cognitive Status: Impaired Orientation Level: Oriented X4 Safety/Judgement: Decreased awareness of safety precautions;Decreased safety judgement for tasks assessed Decreased Safety/Judgement: Decreased awareness of need for assistance Awareness of Errors: Decreased awareness of errors made Decreased Awareness of Errors: Assistance required to identify errors made Problem Solving: Requires assistance for problem solving Sensation/Coordination Sensation Light Touch: Impaired by gross assessment (Pt. reports this long standing issue) Light Touch Impaired Details: Impaired LUE;Impaired RUE (pt. reports tingling/numbness bil. fingertips) Coordination Gross Motor Movements are Fluid and Coordinated: Yes Fine Motor Movements are Fluid and Coordinated: Yes Extremity Assessment RUE Assessment RUE Assessment: Within Functional Limits LUE Assessment LUE Assessment: Within Functional Limits Mobility  Bed Mobility Bed Mobility: Yes Rolling Right: 6: Modified independent (Device/Increase time) Right Sidelying to Sit: 6: Modified independent (Device/Increase time) Transfers Transfers: Yes Sit to Stand: 4: Min assist;From bed;With upper extremity assist (min guard assist) Stand to Sit: 4: Min assist;With upper extremity assist;To bed (min guard assist) Exercises   End of Session OT - End of Session Activity  Tolerance: Patient limited by fatigue Patient left: in bed;with call bell in reach;with bed alarm set Nurse Communication: Mobility status for transfers General Behavior During Session: [facility] for tasks performed Cognition: Impaired, at baseline   Othman Masur M 11/23/2011, 1:17 PM

## 2011-11-23 NOTE — Progress Notes (Signed)
Pt smokes 3/4 ppd and is willing to quit in the near future. Has never tried patches before and is willing to try them. Recommended 21 mg patch x 6 weeks, 14 mg patch x 2 weeks and 7 mg patch x 2 weeks. Discussed patch use instructions and wrote them down for pt as well. Referred to 1-800 quit now for f/u and support. Discussed oral fixation substitutes, second hand smoke and in home smoking policy. Reviewed and gave pt Written education/contact information.

## 2011-11-23 NOTE — Progress Notes (Signed)
Subjective: Patient sleepy, hard to wake up- when awakened, he complained of back pain No CP, no SOB  Objective: Vital signs in last 24 hours: Filed Vitals:   11/22/11 2151 11/23/11 0200 11/23/11 0542 11/23/11 1000  BP: 165/90 146/71 166/92 153/89  Pulse: 73 67 75 67  Temp: 97.8 F (36.6 C) 99.4 F (37.4 C) 100.2 F (37.9 C) 98.7 F (37.1 C)  TempSrc: Oral Oral Oral Oral  Resp: 17 24 22 18   Height:      Weight:      SpO2: 95% 96% 94% 94%   Weight change:   Intake/Output Summary (Last 24 hours) at 11/23/11 1209 Last data filed at 11/23/11 0434  Gross per 24 hour  Intake      0 ml  Output   1900 ml  Net  -1900 ml    Physical Exam: General: sleepy HEENT: EOMI. Neck: Supple CV: S1 and S2, RRR Lungs: Clear to ascultation bilaterally Abdomen: Soft, Nontender, Nondistended, +bowel sounds. Ext: Good pulses. Trace edema. Neuro: Left leg weaknees > R,  LE weaker then UE, patient still with twitching R>L leg  Lab Results:  [facility] 11/21/11 0010  NA 140  K 3.9  CL 105  CO2 27  GLUCOSE 101*  BUN 10  CREATININE 0.80  CALCIUM 9.7  MG --  PHOS --    Basename 11/21/11 0010  AST 21  ALT 14  ALKPHOS 112  BILITOT 0.3  PROT 6.4  ALBUMIN 3.8     Basename 11/21/11 0010  WBC 5.5  NEUTROABS --  HGB 12.1*  HCT 36.9*  MCV 89.6  PLT 189        Basename 11/22/11 0151 11/21/11 0450  CHOL 131 144  HDL 39* 44  LDLCALC 73 79  TRIG 95 106  CHOLHDL 3.4 3.3  LDLDIRECT -- --        Studies/Results: Dg Hip Complete Left  11/22/2011  *RADIOLOGY REPORT*  Clinical Data: Fall with left hip pain.  LEFT HIP - COMPLETE 2+ VIEW  Comparison: 11/11/2010.  Findings: No acute osseous or joint abnormality.  Obturator rings are intact.  Postoperative changes are seen in the spine.  IMPRESSION: No acute findings.  Original Report Authenticated By: Reyes Ivan, M.D.    Medications: I have reviewed the patient's current medications. Scheduled Meds:    . aspirin   300 mg Rectal Daily   Or  . aspirin  325 mg Oral Daily  . diazepam  5 mg Oral BID  . enoxaparin  40 mg Subcutaneous Q24H  . fluticasone  2 puff Inhalation BID  . lisinopril  20 mg Oral Daily  . metoprolol  50 mg Oral BID  . pantoprazole  40 mg Oral Q1200  . pneumococcal 23 valent vaccine  0.5 mL Intramuscular Tomorrow-1000  . simvastatin  20 mg Oral q1800  . tiotropium  18 mcg Inhalation Daily  . DISCONTD: diazepam  10 mg Oral BID  . DISCONTD: lisinopril  10 mg Oral Daily  . DISCONTD: oxycodone  15 mg Oral Q4H   Continuous Infusions:    . DISCONTD: sodium chloride 125 mL/hr at 11/21/11 0029   PRN Meds:.acetaminophen, albuterol, cyclobenzaprine, hydrALAZINE, HYDROmorphone, nitroGLYCERIN, ondansetron (ZOFRAN) IV, oxycodone, senna-docusate, zolpidem, DISCONTD: HYDROmorphone  Assessment/Plan: Principal Problem:   1. Ataxia/falls/slurred speech- CVA w/up, neuro consulted, No MRI secondary to AICD and pt is allergic to IV contrast dye- neuro suspicious that pain meds could be causing his above named problems, will continue to wean off pain meds as  tolerated, PT to eval pt today- held eval yesterday secondary to hip pain and history of falls, x-rays negative for fracture  2. Chest pain- CE negative   3. HYPERCHOLESTEROLEMIA- zocor  4.  BIPOLAR DISORDER UNSPECIFIED   5. TOBACCO ABUSE- encourage cessation   6. HYPERTENSION- add prn hydralazine and increased lisinopril continue metoprolol   7.  BACK PAIN, CHRONIC- plan for him to have surgery in 7 weeks per pt  8. 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 With H/H       LOS: 3 days  Kadesha Virrueta, DO 11/23/2011, 12:09 PM

## 2011-11-25 ENCOUNTER — Encounter (HOSPITAL_COMMUNITY): Payer: Self-pay | Admitting: Emergency Medicine

## 2012-01-03 ENCOUNTER — Other Ambulatory Visit: Payer: Self-pay | Admitting: Orthopedic Surgery

## 2012-01-03 DIAGNOSIS — M545 Low back pain: Secondary | ICD-10-CM

## 2012-01-05 ENCOUNTER — Other Ambulatory Visit: Payer: Medicaid Other

## 2012-01-06 ENCOUNTER — Inpatient Hospital Stay: Admission: RE | Admit: 2012-01-06 | Payer: Medicaid Other | Source: Ambulatory Visit

## 2012-01-11 ENCOUNTER — Encounter: Payer: Self-pay | Admitting: Internal Medicine

## 2012-01-21 ENCOUNTER — Ambulatory Visit
Admission: RE | Admit: 2012-01-21 | Discharge: 2012-01-21 | Disposition: A | Discharge status: Home / Self Care | Payer: Medicaid Other | Source: Ambulatory Visit | Attending: Orthopedic Surgery | Admitting: Orthopedic Surgery

## 2012-01-21 DIAGNOSIS — M545 Low back pain: Secondary | ICD-10-CM

## 2012-01-31 ENCOUNTER — Other Ambulatory Visit: Payer: Self-pay | Admitting: Orthopedic Surgery

## 2012-02-07 ENCOUNTER — Encounter (HOSPITAL_COMMUNITY): Payer: Self-pay | Admitting: Pharmacy Technician

## 2012-02-08 ENCOUNTER — Other Ambulatory Visit: Payer: Self-pay

## 2012-02-08 ENCOUNTER — Inpatient Hospital Stay (HOSPITAL_COMMUNITY): Admission: RE | Admit: 2012-02-08 | Discharge: 2012-02-08 | Payer: Medicaid Other | Source: Ambulatory Visit

## 2012-02-08 NOTE — Progress Notes (Signed)
Note from dr taylor from 5/12, echo 2/13, cxr 2/13, ekg 2/13 in epic

## 2012-02-08 NOTE — Pre-Procedure Instructions (Signed)
20 [name]  02/08/2012   Your procedure is scheduled on:  5.8.13  Report to [facility] at 630 AM.  Call this number if you have problems the morning of surgery: [phone]   Remember:   Do not eat food:After Midnight.  May have clear liquids: up to 4 Hours before arrival.  Clear liquids include soda, tea, black coffee, apple or grape juice, broth.  Take these medicines the morning of surgery with A SIP OF WATER: inhaler, nebulizer, flexiril, metoprolol, omeprazole, spiriva  STOP any aspirin, nsaids, herbal meds, blood thinners 02/09/12   Do not wear jewelry, make-up or nail polish.  Do not wear lotions, powders, or perfumes. You may wear deodorant.  Do not shave 48 hours prior to surgery.  Do not bring valuables to the hospital.  Contacts, dentures or bridgework may not be worn into surgery.  Leave suitcase in the car. After surgery it may be brought to your room.  For patients admitted to the hospital, checkout time is 11:00 AM the day of discharge.   Patients discharged the day of surgery will not be allowed to drive home.  Name and phone number of your driver:   Special Instructions: CHG Shower Use Special Wash: 1/2 bottle night before surgery and 1/2 bottle morning of surgery.   Please read over the following fact sheets that you were given: Pain Booklet, Coughing and Deep Breathing, Blood Transfusion Information, MRSA Information and Surgical Site Infection Prevention

## 2012-02-14 ENCOUNTER — Inpatient Hospital Stay (HOSPITAL_COMMUNITY): Admission: RE | Admit: 2012-02-14 | Discharge: 2012-02-14 | Payer: Medicaid Other | Source: Ambulatory Visit

## 2012-02-14 NOTE — Pre-Procedure Instructions (Signed)
[name]  02/14/2012   Your procedure is scheduled on:  02/16/2012  [facility]  Report to [facility] at 0630 AM AM.  Call this number if you have problems the morning of surgery: [phone]   Remember:   Do not eat food:After Midnight.  May have clear liquids: up to 4 Hours before arrival. 0230 AM..Marland KitchenAFTER 0230 AM DO NOT DRINK ANY LIQUIDS.     Clear liquids include soda, tea, black coffee, apple or grape juice, broth.  Take these medicines the morning of surgery with A SIP OF WATER: ALBUTEROL INHALER(USE IF NEEDED AND BRING TO HOSPITAL WITH YOU THE DAY OF SURGERY) QVAR INHALER  FLEXERIL  HYDROMORPHONE  METOPROLOL(LOPRESSOR)  NITROGLYCERIN IF NEEDED.   PRILOSEC  SPIRIVA INHALER     Do not wear jewelry, make-up or nail polish.  Do not wear lotions, powders, or perfumes. You may wear deodorant.  Do not shave 48 hours prior to surgery.  Do not bring valuables to the hospital.  Contacts, dentures or bridgework may not be worn into surgery.  Leave suitcase in the car. After surgery it may be brought to your room.  For patients admitted to the hospital, checkout time is 11:00 AM the day of discharge.   Patients discharged the day of surgery will not be allowed to drive home.  Name and phone number of your driver:   Special Instructions: CHG Shower Use Special Wash: 1/2 bottle night before surgery and 1/2 bottle morning of surgery.   Please read over the following fact sheets that you were given: Pain Booklet, Coughing and Deep Breathing, Blood Transfusion Information, Lab Information, MRSA Information and Surgical Site Infection Prevention   STOP ASPIRIN  PLAVIX  COUMADIN EFFIENT AND HERBAL MEDICINES.

## 2012-02-14 NOTE — Progress Notes (Addendum)
PT CALLED AT HOME PHONE .Marland KitchenMarland KitchenUNABLE TO LEAVE  MESSAGE  .Marland KitchenMarland Kitchen REGARDING 1200 NOON APPT AT PRE ADMIT. PT'S LINE WILL NOT TAKE MESSAGES.

## 2012-02-15 ENCOUNTER — Telehealth: Payer: Self-pay | Admitting: *Deleted

## 2012-02-15 NOTE — Telephone Encounter (Addendum)
Message copied by Rosalyn Charters on Tue Feb 15, 2012 10:51 AM ------      Message from: [name] Odor      Created: Tue Feb 15, 2012  9:51 AM      Regarding: office consult w/ [facility]       Please schedule this pt. for a consult with Dr. Edilia Bo prior to spine surgery on 03/02/12; pt. needs to be reminded to bring his CD ROM of the LS spine films to appt.  02-15-12 UNABLE TO REACH PT. BY PHONE MAILED APPT. INFO [employer]

## 2012-02-28 ENCOUNTER — Inpatient Hospital Stay (HOSPITAL_COMMUNITY): Admission: RE | Admit: 2012-02-28 | Discharge: 2012-02-28 | Payer: Medicaid Other | Source: Ambulatory Visit

## 2012-02-28 NOTE — Progress Notes (Signed)
Pt called. No answer. Voicemail left instructing pt to return call.

## 2012-02-28 NOTE — Pre-Procedure Instructions (Signed)
20 [name]  02/28/2012   Your procedure is scheduled on:  Thursday Mar 02, 2012.  Report to [facility] at  0530 AM.  Call this number if you have problems the morning of surgery: [phone]   Remember:   Do not eat food:After Midnight.  May have clear liquids: up to 4 Hours before arrival until 0130 am.  Clear liquids include soda, tea, black coffee, apple or grape juice, broth.  Take these medicines the morning of surgery with A SIP OF WATER: Albuterol nebulizer and Qvar inhaler if needed for shortness of breath, Hydromorphone (Exalgo), Metoprolol (Lopressor), Omeprazole (Prilosec), and Spiriva inhaler.   Do not wear jewelry, make-up or nail polish.  Do not wear lotions, powders, or perfumes. You may wear deodorant.  Do not shave 48 hours prior to surgery. Men may shave face and neck.  Do not bring valuables to the hospital.  Contacts, dentures or bridgework may not be worn into surgery.  Leave suitcase in the car. After surgery it may be brought to your room.  For patients admitted to the hospital, checkout time is 11:00 AM the day of discharge.   Patients discharged the day of surgery will not be allowed to drive home.  Name and phone number of your driver:   Special Instructions: CHG Shower Use Special Wash: 1/2 bottle night before surgery and 1/2 bottle morning of surgery.   Please read over the following fact sheets that you were given: Pain Booklet, Coughing and Deep Breathing, Blood Transfusion Information, MRSA Information and Surgical Site Infection Prevention

## 2012-02-29 ENCOUNTER — Encounter (HOSPITAL_COMMUNITY)
Admission: RE | Admit: 2012-02-29 | Discharge: 2012-02-29 | Disposition: A | Discharge status: Home / Self Care | Payer: Medicaid Other | Source: Ambulatory Visit | Attending: Orthopedic Surgery | Admitting: Orthopedic Surgery

## 2012-02-29 ENCOUNTER — Encounter (HOSPITAL_COMMUNITY): Payer: Self-pay

## 2012-02-29 ENCOUNTER — Ambulatory Visit (HOSPITAL_COMMUNITY)
Admission: RE | Admit: 2012-02-29 | Discharge: 2012-02-29 | Disposition: A | Discharge status: Home / Self Care | Payer: Medicaid Other | Source: Ambulatory Visit | Attending: Orthopedic Surgery | Admitting: Orthopedic Surgery

## 2012-02-29 ENCOUNTER — Encounter: Payer: Self-pay | Admitting: Vascular Surgery

## 2012-02-29 DIAGNOSIS — Z01818 Encounter for other preprocedural examination: Secondary | ICD-10-CM | POA: Insufficient documentation

## 2012-02-29 DIAGNOSIS — J4489 Other specified chronic obstructive pulmonary disease: Secondary | ICD-10-CM | POA: Insufficient documentation

## 2012-02-29 DIAGNOSIS — J449 Chronic obstructive pulmonary disease, unspecified: Secondary | ICD-10-CM | POA: Insufficient documentation

## 2012-02-29 DIAGNOSIS — Z01812 Encounter for preprocedural laboratory examination: Secondary | ICD-10-CM | POA: Insufficient documentation

## 2012-02-29 HISTORY — DX: Gastro-esophageal reflux disease without esophagitis: K21.9

## 2012-02-29 HISTORY — DX: Presence of cardiac pacemaker: Z95.0

## 2012-02-29 HISTORY — DX: Chronic obstructive pulmonary disease, unspecified: J44.9

## 2012-02-29 HISTORY — DX: Shortness of breath: R06.02

## 2012-02-29 HISTORY — DX: Cardiac murmur, unspecified: R01.1

## 2012-02-29 LAB — COMPREHENSIVE METABOLIC PANEL
AST: 16 U/L (ref 0–37)
Albumin: 4.1 g/dL (ref 3.5–5.2)
Alkaline Phosphatase: 137 U/L — ABNORMAL HIGH (ref 39–117)
BUN: 11 mg/dL (ref 6–23)
Chloride: 100 mEq/L (ref 96–112)
GFR calc Af Amer: >90 mL/min (ref >90)
GFR calc non Af Amer: >90 mL/min (ref >90)
Potassium: 3.9 mEq/L (ref 3.5–5.1)
Total Bilirubin: 0.3 mg/dL (ref 0.3–1.2)

## 2012-02-29 LAB — URINALYSIS, ROUTINE W REFLEX MICROSCOPIC
Bilirubin Urine: NEGATIVE
Hgb urine dipstick: NEGATIVE
Ketones, ur: NEGATIVE mg/dL
Nitrite: NEGATIVE
Protein, ur: NEGATIVE mg/dL
Urobilinogen, UA: 0.2 mg/dL (ref 0.0–1.0)

## 2012-02-29 LAB — CBC
HCT: 41.5 % (ref 39.0–52.0)
Hemoglobin: 14 g/dL (ref 13.0–17.0)
RBC: 4.58 MIL/uL (ref 4.22–5.81)
RDW: 13.6 % (ref 11.5–15.5)
WBC: 12.3 10*3/uL — ABNORMAL HIGH (ref 4.0–10.5)

## 2012-02-29 LAB — APTT: aPTT: 33 seconds (ref 24–37)

## 2012-02-29 LAB — DIFFERENTIAL
Basophils Absolute: 0 10*3/uL (ref 0.0–0.1)
Eosinophils Relative: 2 % (ref 0–5)
Lymphocytes Relative: 15 % (ref 12–46)
Lymphs Abs: 1.9 10*3/uL (ref 0.7–4.0)
Neutro Abs: 9.7 10*3/uL — ABNORMAL HIGH (ref 1.7–7.7)

## 2012-02-29 LAB — TYPE AND SCREEN
ABO/RH(D): O POS
Antibody Screen: NEGATIVE

## 2012-02-29 LAB — SURGICAL PCR SCREEN: Staphylococcus aureus: POSITIVE — AB

## 2012-02-29 LAB — PROTIME-INR
INR: 1.08 (ref 0.00–1.49)
Prothrombin Time: 14.2 seconds (ref 11.6–15.2)

## 2012-02-29 NOTE — Progress Notes (Signed)
ICD orders faxed to [facility].

## 2012-03-01 ENCOUNTER — Ambulatory Visit (INDEPENDENT_AMBULATORY_CARE_PROVIDER_SITE_OTHER): Payer: Medicaid Other | Admitting: Vascular Surgery

## 2012-03-01 ENCOUNTER — Encounter (HOSPITAL_COMMUNITY): Payer: Self-pay | Admitting: Vascular Surgery

## 2012-03-01 ENCOUNTER — Telehealth: Payer: Self-pay | Admitting: Internal Medicine

## 2012-03-01 ENCOUNTER — Encounter: Payer: Self-pay | Admitting: Vascular Surgery

## 2012-03-01 VITALS — BP 145/81 | HR 63 | Temp 97.8°F | Ht 68.0 in | Wt 148.0 lb

## 2012-03-01 DIAGNOSIS — IMO0002 Reserved for concepts with insufficient information to code with codable children: Secondary | ICD-10-CM

## 2012-03-01 HISTORY — DX: Reserved for concepts with insufficient information to code with codable children: IMO0002

## 2012-03-01 MED ORDER — CEFAZOLIN SODIUM-DEXTROSE 2-3 GM-% IV SOLR: 2.0000 g | INTRAVENOUS | Status: DC

## 2012-03-01 MED ORDER — POVIDONE-IODINE 7.5 % EX SOLN
Freq: Once | CUTANEOUS | Status: DC
  Filled 2012-03-01: qty 118

## 2012-03-01 NOTE — Telephone Encounter (Signed)
Patient will need to be seen before he can proceed with surgery.  Revonda Standard aware

## 2012-03-01 NOTE — Consult Note (Signed)
Anesthesia Chart Review:  Patient is a 61 year old scheduled for L5-S1 anterior lumbar interbody fusion on 03/02/12.  He is s/p L5-S1 transforaminal lumbar interbody fusion, posterolateral fusion T10-L5 on 07/01/11 and C3-T1 fusion on 11/25/10.  Other history includes CAD/MI s/p CABG '06, ischemic CM, CHF, severe arthritis, HTN, Medtronic AICD 10/2007 after EP study revealed inducible VT, smoking, Bipolar disorder, CVA, seizure disorder, HTN, HLD, COPD, polysubstance abuse, GERD.  History lists AAA, but a CTA from 02/04/09 did not demonstrate this.  He was last seen by Cardiologist Dr. Ladona Ridgel on 02/25/11.  His ICD was interrogated last on 09/27/11.  He was seen by Dr. Tenny Craw for Cardiac clearance prior to his cervical fusion.  He was hospitalized in February 2013 for slurred speech, dizziness, difficulty walking, chest pain, and headache.  He ultimately ruled out for CVA and MI.  Ataxia was felt most likely due to medication and lumbar disease.    EKG from 11/20/11 showed NSR, possible LAE, right BBB, inferior infarct.  It was not felt significantly changed since 11/23/10.    Echo from 11/21/10 showed: - Left ventricle: The cavity size was mildly dilated. Wall thickness was increased in a pattern of mild LVH. Systolic function was moderately to severely reduced. The estimated ejection fraction was in the range of 30% to 35%. Posterior and inferior akinesis. Anterolateral hypokinesis. Features are consistent with a pseudonormal left ventricular filling pattern, with concomitant abnormal relaxation and increased filling pressure (grade 2 diastolic dysfunction). E/medial e' > 15 suggests LV end diastolic pressure at least 20 mmHg. - Aortic valve: There was no stenosis. Trivial regurgitation. - Aorta: Mildly dilated aortic root. - Mitral valve: Mild regurgitation. - Left atrium: The atrium was mildly dilated. - Right ventricle: The cavity size was normal. Pacer wire or catheter noted in right ventricle.  Systolic function was mildly to moderately reduced. - Right atrium: The atrium was mildly dilated. - Tricuspid valve: Moderate regurgitation. Peak RV-RA gradient:72mm Hg (S). - Pulmonary arteries: PA systolic pressure 56-60 mmHg. - Systemic veins: IVC measured 2.9 cm with some respirophasic variation, suggesting RA pressure 16-20 mmHg. His EF was down from 55% from his 11/07/07 echo.  Cath from 09/03/08 showed: 1. Continued patency of the internal mammary to the LAD.  2. Continued patency of the saphenous vein graft to the diagonal.  3. Continued patency of the saphenous vein graft to PDA.  4. Occluded saphenous vein graft to the circumflex with essentially widely patent circumflex vessel. Continued medical therapy was recommended.   CXR on 02/29/12 showed: 1. Changes of COPD again noted, without radiographic evidence of acute cardiopulmonary disease.  2. Postoperative changes and support apparatus, as above, similar to priors.  Labs acceptable. I notified him of his + MSSA nasal PCR results, and he will get his Mupirocin Rx filled.  I called and spoke with [name] this morning.  He has had no further chest pain since before his February hospitalization. He is limited in his activity due to significant back pain.  He denies SOB, edema, or ICD shocks.  His BP this morning was 160/87 (was 160/102 yesterday).  Dr. Orvan Falconer at [facility] is his PCP.  I reviewed his history with Anesthesiologist Dr. Randa Evens who agrees that since he has not had Cardiology follow-up in > 1 year, had chest pain in February, and decrease in EF on recent echo that he should be cleared by Cardiology.  I left a message with Dr. Ladona Ridgel this late this morning.  His nurse  Tresa Endo just called back and said that [name] will need to be seen pre-operatively for clearance.  Carla at Dr. Marshell Levan office is aware.  She will notify him, [facility], the OR, and the patient.   Shonna Chock, PA-C

## 2012-03-01 NOTE — Progress Notes (Signed)
[facility] [place]  Patient name: [name] MRN: [id number] DOB: February 07, 1951 Sex: male  REASON FOR CONSULT: evaluate for anterior retroperitoneal exposure of L5-S1 referred by Dr. Estill Bamberg.  HPI: [name] is a 61 y.o. male who injured his back proximally 6 years ago. He states he fell from 3 stories and was caught by a safety rope. He later developed low back pain is had 2 previous operations on his back. He has had surgery on his neck and also on his back from a posterior approach. He's had persistent low back pain and has a nonunion according to the patient. His records from Dr. Marshell Levan office are pending. This pain is constant. It is aggravated by any activity. Is relieved somewhat with rest. He has tried physical therapy and injection therapy without relief. I have been asked to provide anterior retroperitoneal exposure of L5-S1.  Past Medical History  Diagnosis Date  . Coronary artery disease     bypass grafting. Left internal mammary artery to left anterior descending   . Hypertension   . Hypercholesteremia   . Periodic limb movement disorder   . Hypersomnia, unspecified   . Syncope and collapse   . Current smoker   . Degeneration of lumbar or lumbosacral intervertebral disc   . Personal history of other diseases of digestive system   . Cocaine abuse     hx of   . Alcohol abuse     hx of  . Back pain, chronic   . Bipolar disorder, unspecified   . Aortocoronary bypass status 1996    surgery  . Defibrillator activation   . History of ventricular tachycardia      status post automatic implantable cardioverter defibrillator/pacer  . 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 AAA (abdominal aortic aneurysm)   . History of gastrointestinal bleeding   . Myocardial infarction     hx of; sees [place]  . MI (myocardial infarction) 1989 & 2006    with LV dysfunction  . Pacemaker   . ICD  (implantable cardiac defibrillator) in place   . Heart murmur   . COPD (chronic obstructive pulmonary disease)   . Shortness of breath   . GERD (gastroesophageal reflux disease)     Family History  Problem Relation Age of Onset  . Heart disease Mother 5    deceased  . Heart disease Father 72    deceased  . Heart disease Brother     alive  . Heart attack Mother   . Heart attack Father   . GI problems Sister     bleed    SOCIAL HISTORY: History  Substance Use Topics  . Smoking status: Current Everyday Smoker -- 0.3 packs/day    Types: Cigarettes  . Smokeless tobacco: Never Used   Comment: pt states that he is trying to quit, smoking cessation info given and reviewed  . Alcohol Use: No    Allergies  Allergen Reactions  . Amitriptyline Hcl   . Codeine   . Iohexol      Desc: PT STATES THAT HE HAS CONVULSIONS/SEIZURES S/P CONTRAST.  PT WAS GIVEN 1 HR PREMEDS AND HAD NO PROBLEMS.  STEPHANIE DAVIS RT-RCT, Onset Date: [id number]   . Ivp Dye (Iodinated Diagnostic Agents)   . Nifedipine     Current Outpatient Prescriptions  Medication Sig Dispense Refill  . albuterol (PROVENTIL) (2.5 MG/3ML) 0.083% nebulizer solution Take 2.5 mg by  nebulization every 6 (six) hours as needed. For shortness of breath.      Marland Kitchen aspirin 81 MG chewable tablet Chew 81 mg by mouth daily.      . beclomethasone (QVAR) 40 MCG/ACT inhaler Inhale 2 puffs into the lungs daily.       . cyclobenzaprine (FLEXERIL) 10 MG tablet Take 10 mg by mouth 3 (three) times daily as needed. For muscle spasms.      Marland Kitchen HYDROmorphone HCl (EXALGO) 32 MG TB24 Take 32 mg by mouth daily.       Marland Kitchen lisinopril (PRINIVIL,ZESTRIL) 40 MG tablet Take 40 mg by mouth daily.      . metoprolol (LOPRESSOR) 50 MG tablet Take 50 mg by mouth 2 (two) times daily.       . nitroGLYCERIN (NITROSTAT) 0.4 MG SL tablet Place 0.4 mg under the tongue every 5 (five) minutes x 3 doses as needed. For chest pain.      Marland Kitchen omeprazole (PRILOSEC) 20 MG capsule  Take 20 mg by mouth daily.       Marland Kitchen oxyCODONE (OXYCONTIN) 15 MG TB12 Take 15 mg by mouth every 6 (six) hours.      . simvastatin (ZOCOR) 20 MG tablet Take 20 mg by mouth at bedtime.       Marland Kitchen tiotropium (SPIRIVA) 18 MCG inhalation capsule Place 18 mcg into inhaler and inhale daily.       Marland Kitchen UNABLE TO FIND BP cuff for uncontrolled HTN  1 Device  0    REVIEW OF SYSTEMS: Arly.Keller ] denotes positive finding; [  ] denotes negative finding  CARDIOVASCULAR:  [ ]  chest pain   [ ]  chest pressure   [ ]  palpitations   [ ]  orthopnea   Arly.Keller ] dyspnea on exertion   [ ]  claudication   [ ]  rest pain   [ ]  DVT   [ ]  phlebitis PULMONARY:   [ ]  productive cough   [ ]  asthma   [ ]  wheezing NEUROLOGIC:   Arly.Keller ] weakness- both legs  [ ]  paresthesias  [ ]  aphasia  [ ]  amaurosis  [ ]  dizziness HEMATOLOGIC:   [ ]  bleeding problems   [ ]  clotting disorders MUSCULOSKELETAL:  [ ]  joint pain   [ ]  joint swelling [ ]  leg swelling GASTROINTESTINAL: [ ]   blood in stool  [ ]   hematemesis GENITOURINARY:  [ ]   dysuria  [ ]   hematuria PSYCHIATRIC:  [ ]  history of major depression INTEGUMENTARY:  [ ]  rashes  [ ]  ulcers CONSTITUTIONAL:  [ ]  fever   [ ]  chills  PHYSICAL EXAM: Filed Vitals:   03/01/12 0920  BP: 145/81  Pulse: 63  Temp: 97.8 F (36.6 C)  TempSrc: Oral  Height: 5\' 8"  (1.727 m)  Weight: 148 lb (67.132 kg)   Body mass index is 22.50 kg/(m^2). GENERAL: The patient is a well-nourished male, in no acute distress. The vital signs are documented above. CARDIOVASCULAR: There is a regular rate and rhythm without significant murmur appreciated. I do not detect carotid bruits. He has palpable femoral, popliteal, and posterior tibial pulses bilaterally. PULMONARY: There is good air exchange bilaterally without wheezing or rales. ABDOMEN: Soft and non-tender with normal pitched bowel sounds.  MUSCULOSKELETAL: There are no major deformities or cyanosis. NEUROLOGIC: No focal weakness  SKIN: There are no ulcers or rashes  noted. PSYCHIATRIC: The patient has a normal affect.  DATA:  Lab Results  Component Value Date   WBC 12.3* 02/29/2012  HGB 14.0 02/29/2012   HCT 41.5 02/29/2012   MCV 90.6 02/29/2012   PLT 274 02/29/2012   Lab Results  Component Value Date   NA 138 02/29/2012   K 3.9 02/29/2012   CL 100 02/29/2012   CO2 29 02/29/2012   Lab Results  Component Value Date   CREATININE 0.92 02/29/2012   Lab Results  Component Value Date   INR 1.08 02/29/2012   INR 1.10 09/08/2011   INR 1.02 06/28/2011   Lab Results  Component Value Date   HGBA1C 5.6 11/22/2011   I have reviewed his CT of his back which shows degenerative disc disease L5-S1.he does have some calcium in the aorta. This is not circumferential.  I have reviewed his records from Dr. Marshell Levan office which were faxed over. 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 Dr Yevette Edwards has recommended anterior lumbar interbody fusion at L5-S1 was removal of his previously placed interbody implant.  MEDICAL ISSUES: He appears to be a reasonable candidate for anterior retroperitoneal exposure of L5-S1. I have reviewed our role in exposure of the spine in order to allow anterior lumbar interbody fusion at the appropriate levels. We have discussed the potential complications of surgery, including but not limited to, arterial or venous injury, thrombosis, or bleeding. We have also discussed the potential risks of wound healing problems, the development of a hernia, nerve injury, leg swelling, or other unpredictable medical problems. I've also explained that for the L5-S1 level there is a small risk of retrograde ejaculation. All the patient's questions were answered and they are agreeable to proceed.    Laveda Demedeiros S [facility] [place] Beeper: [phone]

## 2012-03-01 NOTE — Telephone Encounter (Signed)
New problem:  Per Revonda Standard, Surgery on tomorrow-  interior fusion. Need cardiac clearance. Repeat echo in feb.

## 2012-03-02 ENCOUNTER — Ambulatory Visit (HOSPITAL_COMMUNITY): Admission: RE | Admit: 2012-03-02 | Payer: Medicaid Other | Source: Ambulatory Visit | Admitting: Orthopedic Surgery

## 2012-03-02 ENCOUNTER — Encounter (HOSPITAL_COMMUNITY): Admission: RE | Payer: Self-pay | Source: Ambulatory Visit

## 2012-03-02 SURGERY — ANTERIOR LUMBAR FUSION 1 LEVEL
Anesthesia: General

## 2012-04-03 ENCOUNTER — Emergency Department (HOSPITAL_COMMUNITY)
Admission: EM | Admit: 2012-04-03 | Discharge: 2012-04-03 | Disposition: A | Discharge status: Home / Self Care | Payer: Medicaid Other | Attending: Emergency Medicine | Admitting: Emergency Medicine

## 2012-04-03 ENCOUNTER — Encounter (HOSPITAL_COMMUNITY): Payer: Self-pay | Admitting: *Deleted

## 2012-04-03 ENCOUNTER — Emergency Department (HOSPITAL_COMMUNITY): Payer: Medicaid Other

## 2012-04-03 DIAGNOSIS — J449 Chronic obstructive pulmonary disease, unspecified: Secondary | ICD-10-CM | POA: Insufficient documentation

## 2012-04-03 DIAGNOSIS — F319 Bipolar disorder, unspecified: Secondary | ICD-10-CM | POA: Insufficient documentation

## 2012-04-03 DIAGNOSIS — E7800 Pure hypercholesterolemia, unspecified: Secondary | ICD-10-CM | POA: Insufficient documentation

## 2012-04-03 DIAGNOSIS — Z7982 Long term (current) use of aspirin: Secondary | ICD-10-CM | POA: Insufficient documentation

## 2012-04-03 DIAGNOSIS — J4489 Other specified chronic obstructive pulmonary disease: Secondary | ICD-10-CM | POA: Insufficient documentation

## 2012-04-03 DIAGNOSIS — I10 Essential (primary) hypertension: Secondary | ICD-10-CM

## 2012-04-03 DIAGNOSIS — M549 Dorsalgia, unspecified: Secondary | ICD-10-CM

## 2012-04-03 DIAGNOSIS — Z79899 Other long term (current) drug therapy: Secondary | ICD-10-CM | POA: Insufficient documentation

## 2012-04-03 DIAGNOSIS — I252 Old myocardial infarction: Secondary | ICD-10-CM | POA: Insufficient documentation

## 2012-04-03 DIAGNOSIS — Z8673 Personal history of transient ischemic attack (TIA), and cerebral infarction without residual deficits: Secondary | ICD-10-CM | POA: Insufficient documentation

## 2012-04-03 DIAGNOSIS — W19XXXA Unspecified fall, initial encounter: Secondary | ICD-10-CM | POA: Insufficient documentation

## 2012-04-03 DIAGNOSIS — G8929 Other chronic pain: Secondary | ICD-10-CM | POA: Insufficient documentation

## 2012-04-03 DIAGNOSIS — Z951 Presence of aortocoronary bypass graft: Secondary | ICD-10-CM | POA: Insufficient documentation

## 2012-04-03 DIAGNOSIS — F17200 Nicotine dependence, unspecified, uncomplicated: Secondary | ICD-10-CM | POA: Insufficient documentation

## 2012-04-03 DIAGNOSIS — I2510 Atherosclerotic heart disease of native coronary artery without angina pectoris: Secondary | ICD-10-CM | POA: Insufficient documentation

## 2012-04-03 MED ORDER — OXYCODONE-ACETAMINOPHEN 7.5-325 MG PO TABS: 1.0000 | ORAL_TABLET | ORAL | Status: AC | PRN

## 2012-04-03 MED ORDER — HYDROMORPHONE HCL PF 1 MG/ML IJ SOLN
1.0000 mg | Freq: Once | INTRAMUSCULAR | Status: AC
  Administered 2012-04-03: 1 mg via INTRAMUSCULAR
  Filled 2012-04-03: qty 1

## 2012-04-03 NOTE — ED Notes (Signed)
Patient states he has hx of back surgery.  He states his back has not healed.  He is waiting for clearance to have surgery.  He is having increased pain in his back, lower back.  Patient states he has pain down into his legs and groin

## 2012-04-03 NOTE — ED Provider Notes (Addendum)
History   This chart was scribed for Forbes Cellar, MD by Toya Smothers. The patient was seen in room TR10C/TR10C. Patient's care was started at 1118.  CSN: [id number]  Arrival date & time 04/03/12  1118   First MD Initiated Contact with Patient 04/03/12 1359      Chief Complaint  Patient presents with  . Fall  . Back Pain    The history is provided by the patient. No language interpreter was used.    [name] is a 61 y.o. male who presents to the Emergency Department complaining of sudden onset moderate severe constant back pain as the result of an injury yesterday. Falls frequently 2/2 to his chronic back pain and "legs buckling".  Pt states that his legs collapsed and he fell causing his butox area to land on his foot causing excruciating pain. Pt reports a h/o chronic lower back pain problems and two surgeries. Pt is currently awaiting clearance for another surgery because of non-union of rod placement that is scheduled for the 04/07/12, and reports that  anesthesiologist is worried that his heart cannot take the surgery. Pt has run out of current prescription, but reports that he was taking 7.5 percocet 2x every 2 to 4 hours. Pt has allergies to Codeine, Iohexol, IVP Dye, and Nifedipine.   ED Notes, ED Provider Notes from 04/03/12 0000 to 04/03/12 11:49:20       Kristi Marquis Buggy, RN 04/03/2012 11:48      Patient states he has hx of back surgery. He states his back has not healed. He is waiting for clearance to have surgery. He is having increased pain in his back, lower back. Patient states he has pain down into his legs and groin     Past Medical History  Diagnosis Date  . Coronary artery disease     bypass grafting. Left internal mammary artery to left anterior descending   . Hypertension   . Hypercholesteremia   . Periodic limb movement disorder   . Hypersomnia, unspecified   . Syncope and collapse   . Tobacco use disorder   . Degeneration of lumbar or lumbosacral  intervertebral disc   . Personal history of other diseases of digestive system   . Cocaine abuse     hx of   . Alcohol abuse     hx of  . Back pain, chronic   . Bipolar disorder, unspecified   . Aortocoronary bypass status 1996    surgery  . Defibrillator activation   . History of ventricular tachycardia      status post automatic implantable cardioverter defibrillator/pacer  . 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 AAA (abdominal aortic aneurysm)   . History of gastrointestinal bleeding   . Myocardial infarction     hx of; sees [place]  . MI (myocardial infarction) 1989 & 2006    with LV dysfunction  . Pacemaker   . ICD (implantable cardiac defibrillator) in place   . Heart murmur   . COPD (chronic obstructive pulmonary disease)   . Shortness of breath   . GERD (gastroesophageal reflux disease)     Past Surgical History  Procedure Date  . Aortocoronary byp   . Icd pla   . Cardiac defibrillator placement 10/23/2007     Medtronic Maximo single chamber cardioverter-defibrillator  . Coronary artery bypass graft 2007    in [place], [place] -- LIMA to LAD, SVG to diagonal, SVG  to RCA, SVG to circumflex (saphenous vein graft to circumflex totaled at catheterization in 2006)  . Cardiac catheterization 05/14/2006    Est EF of over 50%  . Cardiac catheterization     Est EF of 50% --   . Cardiac catheterization 09/03/2008    Continued patency of the internal mammary to the LAD -- of the saphenous vein graft to the diagonal -- of the saphenous vein graft to PDA -- Occluded saphenous vein graft to the circumflex with essentially widely patent circumflex vessel  . Lumbar fusion     L5-S1 transforaminal lumbar interbody fusion with contralateral posterolateral fusion at the same level -- Posterolateral fusion T10-11, T11-12, T12-L1, L1-L2, L2-L3, L3-L4, L4-L5 --   . Lumbar laminectomy/decompression microdiscectomy 11/26/2010     Laminectomy with decompression of the spinal cord C3-4, C4-5, C5-6,C6-7  . Cervical fusion     Family History  Problem Relation Age of Onset  . Heart disease Mother 79    deceased  . Heart disease Father 40    deceased  . Heart disease Brother     alive  . Heart attack Mother   . Heart attack Father   . GI problems Sister     bleed    History  Substance Use Topics  . Smoking status: Current Everyday Smoker -- 0.3 packs/day    Types: Cigarettes  . Smokeless tobacco: Never Used   Comment: pt states that he is trying to quit, smoking cessation info given and reviewed  . Alcohol Use: No    Review of Systems  Constitutional: Negative for fever and chills.  HENT: Negative for rhinorrhea and neck pain.   Eyes: Negative for pain.  Respiratory: Negative for cough and shortness of breath.   Cardiovascular: Negative for chest pain.  Gastrointestinal: Negative for nausea, vomiting, abdominal pain and diarrhea.  Genitourinary: Negative for dysuria.  Musculoskeletal: Positive for back pain.  Skin: Negative for rash.  Neurological: Negative for dizziness and weakness.  All other systems reviewed and are negative.    Allergies  Amitriptyline hcl; Codeine; Iohexol; Ivp dye; and Nifedipine  Home Medications   Current Outpatient Rx  Name Route Sig Dispense Refill  . ALBUTEROL SULFATE (2.5 MG/3ML) 0.083% IN NEBU Nebulization Take 2.5 mg by nebulization every 6 (six) hours as needed. For shortness of breath.    . ASPIRIN 81 MG PO CHEW Oral Chew 81 mg by mouth daily.    . BECLOMETHASONE DIPROPIONATE 40 MCG/ACT IN AERS Inhalation Inhale 2 puffs into the lungs daily.     . CYCLOBENZAPRINE HCL 10 MG PO TABS Oral Take 10 mg by mouth 3 (three) times daily as needed. For muscle spasms.    Marland Kitchen HYDROMORPHONE HCL ER 32 MG PO TB24 Oral Take 32 mg by mouth daily.     Marland Kitchen LISINOPRIL 40 MG PO TABS Oral Take 40 mg by mouth daily.    Marland Kitchen METOPROLOL TARTRATE 50 MG PO TABS Oral Take 50 mg by mouth 2 (two)  times daily.     Marland Kitchen NITROGLYCERIN 0.4 MG SL SUBL Sublingual Place 0.4 mg under the tongue every 5 (five) minutes x 3 doses as needed. For chest pain.    Marland Kitchen OMEPRAZOLE 20 MG PO CPDR Oral Take 20 mg by mouth daily.     . OXYCODONE HCL ER 15 MG PO TB12 Oral Take 15 mg by mouth every 6 (six) hours.    Marland Kitchen SIMVASTATIN 20 MG PO TABS Oral Take 20 mg by mouth at bedtime.     Marland Kitchen  TIOTROPIUM BROMIDE MONOHYDRATE 18 MCG IN CAPS Inhalation Place 18 mcg into inhaler and inhale daily.     . OXYCODONE-ACETAMINOPHEN 7.5-325 MG PO TABS Oral Take 1 tablet by mouth every 4 (four) hours as needed for pain. 10 tablet 0    BP 156/131  Pulse 73  Temp 97.9 F (36.6 C) (Oral)  Resp 18  Ht 5\' 11"  (1.803 m)  Wt 150 lb (68.04 kg)  BMI 20.92 kg/m2  SpO2 100%  Physical Exam  Nursing note and vitals reviewed. Constitutional: He is oriented to person, place, and time. He appears well-developed and well-nourished. No distress.  HENT:  Head: Normocephalic and atraumatic.  Eyes: EOM are normal. Pupils are equal, round, and reactive to light.  Neck: Neck supple. No tracheal deviation present.  Cardiovascular: Normal rate, regular rhythm and normal heart sounds.   Pulmonary/Chest: Effort normal. No respiratory distress.  Abdominal: Soft. He exhibits no distension.  Genitourinary: Rectum normal and penis normal.       Scrotum normal. Minimal tenderness to perineum.  Musculoskeletal: Normal range of motion. He exhibits no edema.       Diffuse lower lumbar tenderness diffusely to palpation.  Neurological: He is alert and oriented to person, place, and time. No sensory deficit.       Strength 5/5 b/l LE  Skin: Skin is warm and dry.  Psychiatric: He has a normal mood and affect. His behavior is normal.    ED Course  Procedures (including critical care time) DIAGNOSTIC STUDIES: Oxygen Saturation is 100% on room air, normal by my interpretation.    COORDINATION OF CARE: 1407- Ordered [place]  and x ray.   Labs  Reviewed - No data to display Dg Lumbar Spine Complete  04/03/2012  *RADIOLOGY REPORT*  Clinical Data: Fall, back pain.  LUMBAR SPINE - COMPLETE 4+ VIEW  Comparison: CT 01/21/2012  Findings: Changes of prior posterior fusion from T10-S1.  Changes of multilevel laminectomies.  Slight scoliosis, stable. Lucency around the S1 screws is stable compatible with loosening. Otherwise, no hardware complicating feature.  No acute bony abnormality.  No fracture or acute subluxation.  SI joints are symmetric and unremarkable.  IMPRESSION: Extensive postoperative changes.  Posterior fusion from T10-S1.  No acute findings.  Stable lucency around the S1 screws compatible with loosening.  Original Report Authenticated By: Cyndie Chime, M.D.     1. Chronic back pain   2. Fall   3. Hypertension     MDM  Pt with acute on chronic back pain after fall. Hardware stable. Neurovasc intact. BP improved with pain control in ED but continues to have pain. Claims compliance with antihypertensives. States DBP usually < 120 at baseline. Denies cp/sob/difficulty urinating and does not want labs done today in ED. He will f/u with his PMD who is currently adjusting his antihypertensives. Would lke to f/u with his neurosurgeon and states that he is going to be admitted to pain management clinic soon.    I personally performed the services described in this documentation, which was scribed in my presence. The recorded information has been reviewed and considered.      Forbes Cellar, MD 04/03/12 1629  Forbes Cellar, MD 04/03/12 [phone]

## 2012-04-03 NOTE — Discharge Instructions (Signed)
Back Pain, Adult Low back pain is very common. About 1 in 5 people have back pain.The cause of low back pain is rarely dangerous. The pain often gets better over time.About half of people with a sudden onset of back pain feel better in just 2 weeks. About 8 in 10 people feel better by 6 weeks.  CAUSES Some common causes of back pain include:  Strain of the muscles or ligaments supporting the spine.   Wear and tear (degeneration) of the spinal discs.   Arthritis.   Direct injury to the back.  DIAGNOSIS Most of the time, the direct cause of low back pain is not known.However, back pain can be treated effectively even when the exact cause of the pain is unknown.Answering your caregiver's questions about your overall health and symptoms is one of the most accurate ways to make sure the cause of your pain is not dangerous. If your caregiver needs more information, he or she may order lab work or imaging tests (X-rays or MRIs).However, even if imaging tests show changes in your back, this usually does not require surgery. HOME CARE INSTRUCTIONS For many people, back pain returns.Since low back pain is rarely dangerous, it is often a condition that people can learn to manageon their own.   Remain active. It is stressful on the back to sit or stand in one place. Do not sit, drive, or stand in one place for more than 30 minutes at a time. Take short walks on level surfaces as soon as pain allows.Try to increase the length of time you walk each day.   Do not stay in bed.Resting more than 1 or 2 days can delay your recovery.   Do not avoid exercise or work.Your body is made to move.It is not dangerous to be active, even though your back may hurt.Your back will likely heal faster if you return to being active before your pain is gone.   Pay attention to your body when you bend and lift. Many people have less discomfortwhen lifting if they bend their knees, keep the load close to their  bodies,and avoid twisting. Often, the most comfortable positions are those that put less stress on your recovering back.   Find a comfortable position to sleep. Use a firm mattress and lie on your side with your knees slightly bent. If you lie on your back, put a pillow under your knees.   Only take over-the-counter or prescription medicines as directed by your caregiver. Over-the-counter medicines to reduce pain and inflammation are often the most helpful.Your caregiver may prescribe muscle relaxant drugs.These medicines help dull your pain so you can more quickly return to your normal activities and healthy exercise.   Put ice on the injured area.   Put ice in a plastic bag.   Place a towel between your skin and the bag.   Leave the ice on for 15 to 20 minutes, 3 to 4 times a day for the first 2 to 3 days. After that, ice and heat may be alternated to reduce pain and spasms.   Ask your caregiver about trying back exercises and gentle massage. This may be of some benefit.   Avoid feeling anxious or stressed.Stress increases muscle tension and can worsen back pain.It is important to recognize when you are anxious or stressed and learn ways to manage it.Exercise is a great option.  SEEK MEDICAL CARE IF:  You have pain that is not relieved with rest or medicine.   You have   pain that does not improve in 1 week.   You have new symptoms.   You are generally not feeling well.  SEEK IMMEDIATE MEDICAL CARE IF:   You have pain that radiates from your back into your legs.   You develop new bowel or bladder control problems.   You have unusual weakness or numbness in your arms or legs.   You develop nausea or vomiting.   You develop abdominal pain.   You feel faint.  Document Released: 09/27/2005 Document Revised: 09/16/2011 Document Reviewed: 02/15/2011 ExitCare Patient Information 2012 [employer]  RESOURCE GUIDE  Dental Problems  Patients with Medicaid: [facility]                     [facility] [address]                                           [address] Phone:  [phone]                                                   Phone:  [phone]  If unable to pay or uninsured, contact:  [facility] or [facility] to become qualified for the adult dental clinic.  Chronic Pain Problems Contact [facility]  [phone] Patients need to be referred by their primary care doctor.  Insufficient Money for Medicine Contact [employer]:  call "211" or [facility] 271-5999.  No Primary Care Doctor Call [facility]  832-8000 Other agencies that provide inexpensive medical care    [facility]  [phone]    [facility]  [phone]    [facility]  [phone]    [facility]  [phone]    [facility]  [phone]    [facility]  [phone]  Psychological Services [facility]  [phone] [facility]  [phone] [facility]   800 853-5163 (emergency services 641-4993)  Abuse/Neglect [place] [facility] Abuse Hotline (336) 641-3795 [place] [facility] Hotline 800-378-5315 (After Hours)  Emergency Shelter [facility] (336) 271-5985  [facility] (336) 275-9566 [facility] (704) 372-4663  MRSA Hotline #:   832-7006    [place] [facility]  [facility] [place]  [employer]                           [facility] [address] [place]                     [address]         [address]  [place]                                               [place]                                [place] Phone:  [phone]                                  Phone:  [phone]                   Phone:  [phone]  [facility] Phone:  [phone]  [place]  Child Abuse Hotline (336) 342-1394 (336) 342-3537 (After Hours)  

## 2012-04-07 ENCOUNTER — Ambulatory Visit (INDEPENDENT_AMBULATORY_CARE_PROVIDER_SITE_OTHER): Payer: Medicaid Other | Admitting: Internal Medicine

## 2012-04-07 ENCOUNTER — Encounter: Payer: Self-pay | Admitting: Internal Medicine

## 2012-04-07 VITALS — BP 160/92 | HR 64 | Ht 70.0 in | Wt 145.1 lb

## 2012-04-07 DIAGNOSIS — I472 Ventricular tachycardia, unspecified: Secondary | ICD-10-CM | POA: Insufficient documentation

## 2012-04-07 DIAGNOSIS — M5137 Other intervertebral disc degeneration, lumbosacral region: Secondary | ICD-10-CM

## 2012-04-07 HISTORY — DX: Ventricular tachycardia, unspecified: I47.20

## 2012-04-07 HISTORY — DX: Ventricular tachycardia: I47.2

## 2012-04-07 LAB — ICD DEVICE OBSERVATION
CHARGE TIME: 8.51 s
DEV-0020ICD: NEGATIVE
FVT: 0
RV LEAD AMPLITUDE: 1.5 mv
RV LEAD IMPEDENCE ICD: 440 Ohm
RV LEAD THRESHOLD: 1.5 V
TZAT-0001FASTVT: 1
TZAT-0001SLOWVT: 1
TZAT-0002FASTVT: NEGATIVE
TZAT-0002SLOWVT: NEGATIVE
TZAT-0002SLOWVT: NEGATIVE
TZAT-0004FASTVT: 8
TZAT-0012SLOWVT: 200 ms
TZAT-0012SLOWVT: 200 ms
TZAT-0013FASTVT: 1
TZAT-0018FASTVT: NEGATIVE
TZAT-0019SLOWVT: 8 V
TZAT-0019SLOWVT: 8 V
TZAT-0020FASTVT: 1.6 ms
TZAT-0020SLOWVT: 1.6 ms
TZAT-0020SLOWVT: 1.6 ms
TZON-0008SLOWVT: 0 ms
TZON-0010AFLUTTER: 50 ms
TZON-0010FASTVT: 50 ms
TZON-0010SLOWVT: 50 ms
TZON-0010VSLOWVT: 50 ms
TZST-0001FASTVT: 2
TZST-0001FASTVT: 4
TZST-0001FASTVT: 6
TZST-0001SLOWVT: 5
TZST-0002FASTVT: NEGATIVE
TZST-0002FASTVT: NEGATIVE
TZST-0002FASTVT: NEGATIVE
TZST-0002SLOWVT: NEGATIVE
TZST-0003FASTVT: 35 J
TZST-0003FASTVT: 35 J
TZST-0003SLOWVT: 25 J
TZST-0003SLOWVT: 35 J

## 2012-04-07 NOTE — Assessment & Plan Note (Signed)
He has had no sustained ventricular arrhythmias. No change in medical therapy. 

## 2012-04-07 NOTE — Patient Instructions (Addendum)
Your physician recommends that you schedule a follow-up appointment in: 3 months with device clinic and 12 months with Dr Taylor  

## 2012-04-07 NOTE — Assessment & Plan Note (Signed)
The patient is pending back surgery. He describes a very extensive operation. His cardiac status has been stable. He does not have angina or heart failure symptoms. I would recommend the patient be allowed to proceed with surgery. He is of moderate surgical risk for cardiovascular complications. Should problems arise, our service will be happy to be involved in his care. He will need his device turned off prior to surgery.

## 2012-04-07 NOTE — Progress Notes (Signed)
HPI [name] returns today for followup. He is a very pleasant 61 year old man with an ischemic cardiomyopathy, ventricular tachycardia, chronic systolic heart failure, status post ICD implantation. He has developed severe degenerative disc disease and is pending extensive revision. He denies chest pain or shortness of breath. He denies peripheral edema. He is down to smoking less than a half pack of cigarettes daily. His only complaint is severe pain in his back associated with inability to walk secondary to weakness and pain in his lower back. He states that his life is not worth living because of the severe pain in his back. He is pending back surgery.  Allergies  Allergen Reactions  . Amitriptyline Hcl     unknown  . Codeine     unknown  . Iohexol      Desc: PT STATES THAT HE HAS CONVULSIONS/SEIZURES S/P CONTRAST.  PT WAS GIVEN 1 HR PREMEDS AND HAD NO PROBLEMS.  [name] RT-RCT, Onset Date: [id number]   . Ivp Dye (Iodinated Diagnostic Agents)     unknown  . Nifedipine     unknown     Current Outpatient Prescriptions  Medication Sig Dispense Refill  . albuterol (PROVENTIL) (2.5 MG/3ML) 0.083% nebulizer solution Take 2.5 mg by nebulization every 6 (six) hours as needed. For shortness of breath.      Marland Kitchen aspirin 81 MG chewable tablet Chew 81 mg by mouth daily.      . beclomethasone (QVAR) 40 MCG/ACT inhaler Inhale 2 puffs into the lungs daily.       . cyclobenzaprine (FLEXERIL) 10 MG tablet Take 10 mg by mouth 3 (three) times daily as needed. For muscle spasms.      Marland Kitchen HYDROmorphone HCl (EXALGO) 32 MG TB24 Take 32 mg by mouth daily.       Marland Kitchen lisinopril (PRINIVIL,ZESTRIL) 40 MG tablet Take 40 mg by mouth daily.      . metoprolol (LOPRESSOR) 50 MG tablet Take 50 mg by mouth 2 (two) times daily.       . nitroGLYCERIN (NITROSTAT) 0.4 MG SL tablet Place 0.4 mg under the tongue every 5 (five) minutes x 3 doses as needed. For chest pain.      Marland Kitchen omeprazole (PRILOSEC) 20 MG capsule Take 20 mg  by mouth daily.       Marland Kitchen oxyCODONE (OXYCONTIN) 15 MG TB12 Take 15 mg by mouth every 6 (six) hours.      Marland Kitchen oxyCODONE-acetaminophen (PERCOCET) 7.5-325 MG per tablet Take 1 tablet by mouth every 4 (four) hours as needed for pain.  10 tablet  0  . simvastatin (ZOCOR) 20 MG tablet Take 20 mg by mouth at bedtime.       Marland Kitchen tiotropium (SPIRIVA) 18 MCG inhalation capsule Place 18 mcg into inhaler and inhale daily.          Past Medical History  Diagnosis Date  . Coronary artery disease     bypass grafting. Left internal mammary artery to left anterior descending   . Hypertension   . Hypercholesteremia   . Periodic limb movement disorder   . Hypersomnia, unspecified   . Syncope and collapse   . Tobacco use disorder   . Degeneration of lumbar or lumbosacral intervertebral disc   . Personal history of other diseases of digestive system   . Cocaine abuse     hx of   . Alcohol abuse     hx of  . Back pain, chronic   . Bipolar disorder, unspecified   . Aortocoronary  bypass status 1996    surgery  . Defibrillator activation   . History of ventricular tachycardia      status post automatic implantable cardioverter defibrillator/pacer  . 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 AAA (abdominal aortic aneurysm)   . History of gastrointestinal bleeding   . Myocardial infarction     hx of; sees [place]  . MI (myocardial infarction) 1989 & 2006    with LV dysfunction  . Pacemaker   . ICD (implantable cardiac defibrillator) in place   . Heart murmur   . COPD (chronic obstructive pulmonary disease)   . Shortness of breath   . GERD (gastroesophageal reflux disease)     ROS:   All systems reviewed and negative except as noted in the HPI.   Past Surgical History  Procedure Date  . Aortocoronary byp   . Icd pla   . Cardiac defibrillator placement 10/23/2007     Medtronic Maximo single chamber cardioverter-defibrillator  . Coronary artery bypass  graft 2007    in [place], [place] -- LIMA to LAD, SVG to diagonal, SVG to RCA, SVG to circumflex (saphenous vein graft to circumflex totaled at catheterization in 2006)  . Cardiac catheterization 05/14/2006    Est EF of over 50%  . Cardiac catheterization     Est EF of 50% --   . Cardiac catheterization 09/03/2008    Continued patency of the internal mammary to the LAD -- of the saphenous vein graft to the diagonal -- of the saphenous vein graft to PDA -- Occluded saphenous vein graft to the circumflex with essentially widely patent circumflex vessel  . Lumbar fusion     L5-S1 transforaminal lumbar interbody fusion with contralateral posterolateral fusion at the same level -- Posterolateral fusion T10-11, T11-12, T12-L1, L1-L2, L2-L3, L3-L4, L4-L5 --   . Lumbar laminectomy/decompression microdiscectomy 11/26/2010    Laminectomy with decompression of the spinal cord C3-4, C4-5, C5-6,C6-7  . Cervical fusion      Family History  Problem Relation Age of Onset  . Heart disease Mother 38    deceased  . Heart disease Father 48    deceased  . Heart disease Brother     alive  . Heart attack Mother   . Heart attack Father   . GI problems Sister     bleed     History   Social History  . Marital Status: Divorced    Spouse Name: N/A    Number of Children: N/A  . Years of Education: N/A   Occupational History  . Not on file.   Social History Main Topics  . Smoking status: Current Everyday Smoker -- 0.3 packs/day    Types: Cigarettes  . Smokeless tobacco: Never Used   Comment: pt states that he is trying to quit, smoking cessation info given and reviewed  . Alcohol Use: No  . Drug Use: No     He last tested positive for THC in 10-2007. Last tested positive for cocaine in 2007  . Sexually Active: Not on file   Other Topics Concern  . Not on file   Social History Narrative  . No narrative on file     BP 160/92  Pulse 64  Ht 5\' 10"  (1.778 m)  Wt 145 lb 1.9 oz  (65.826 kg)  BMI 20.82 kg/m2  Physical Exam:  ill appearing middle-aged man, NAD, sitting in a walker. HEENT: Unremarkable Neck:  No JVD,  no thyromegally Lungs:  Clear with no wheezes, rales, or rhonchi. HEART:  Regular rate rhythm, no murmurs, no rubs, no clicks Abd:  soft, positive bowel sounds, no organomegally, no rebound, no guarding Ext:  2 plus pulses, no edema, no cyanosis, no clubbing Skin:  No rashes no nodules Neuro:  CN II through XII intact, motor grossly intact  DEVICE  Normal device function.  See PaceArt for details.   Assess/Plan:

## 2012-06-27 ENCOUNTER — Encounter: Payer: Self-pay | Admitting: *Deleted

## 2012-06-27 DIAGNOSIS — Z95810 Presence of automatic (implantable) cardiac defibrillator: Secondary | ICD-10-CM

## 2012-06-27 HISTORY — DX: Presence of automatic (implantable) cardiac defibrillator: Z95.810

## 2012-07-21 ENCOUNTER — Encounter: Payer: Self-pay | Admitting: Internal Medicine

## 2012-07-24 ENCOUNTER — Encounter: Payer: Self-pay | Admitting: Internal Medicine

## 2012-09-29 IMAGING — CR DG HIP (WITH OR WITHOUT PELVIS) 2-3V*L*
3 series · 3 of 3 positions shown · non-contrast
Comparison: None.

CLINICAL DATA: Fall.  Left hip pain.

LEFT HIP - COMPLETE 2+ VIEW

[t pelvis a.p.]
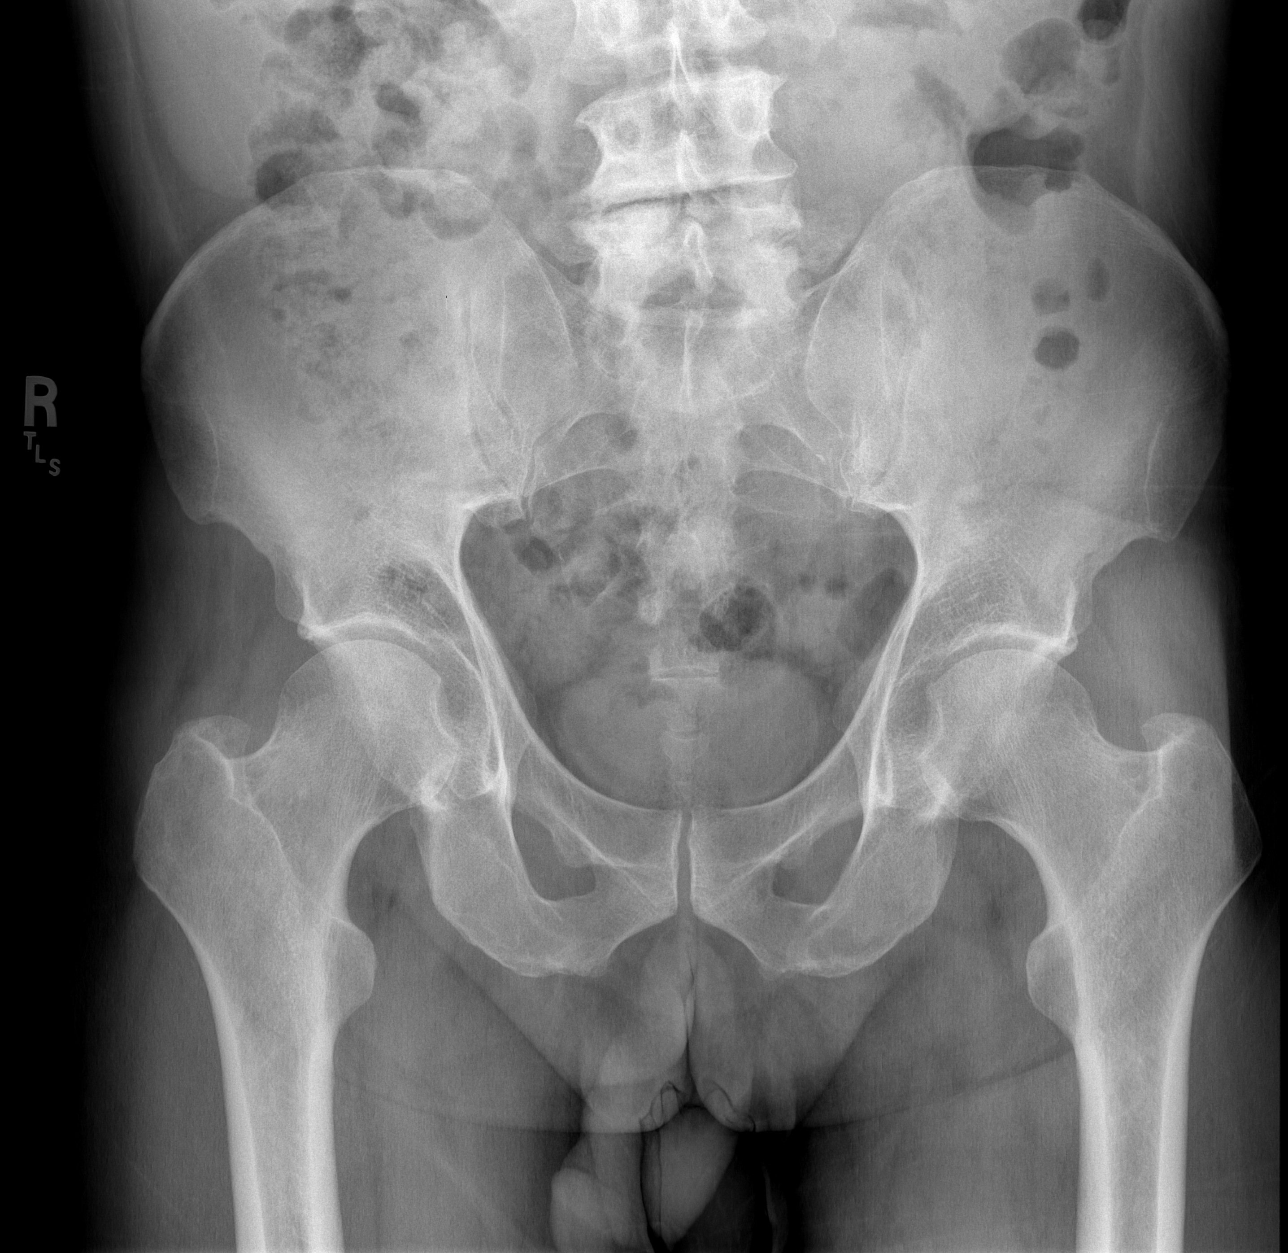

[t hip ap left]
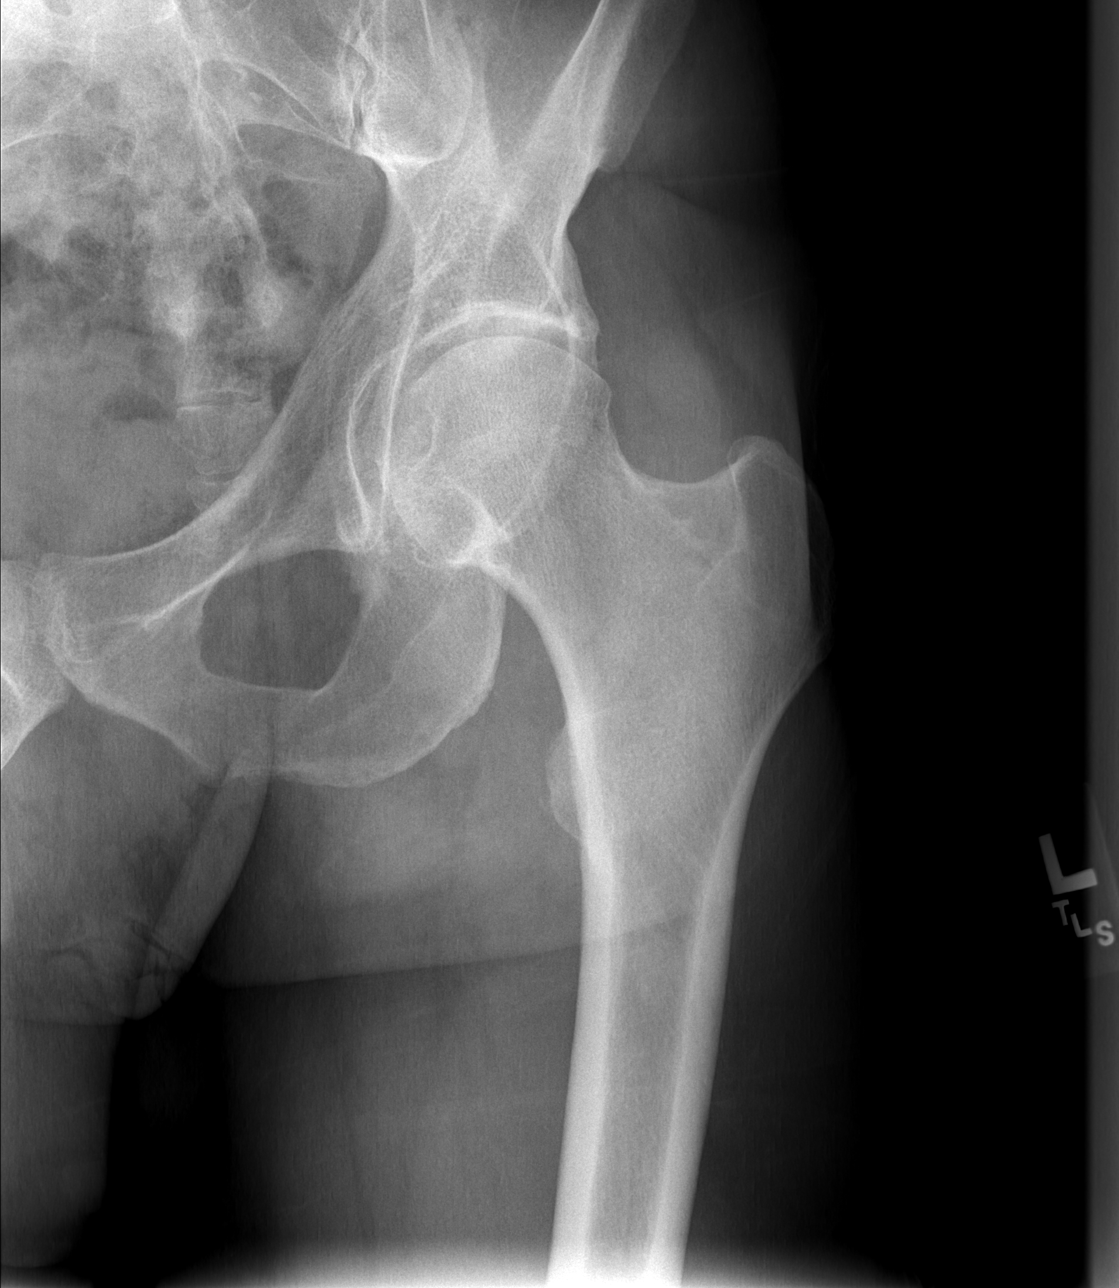

[t hip frog leg left]
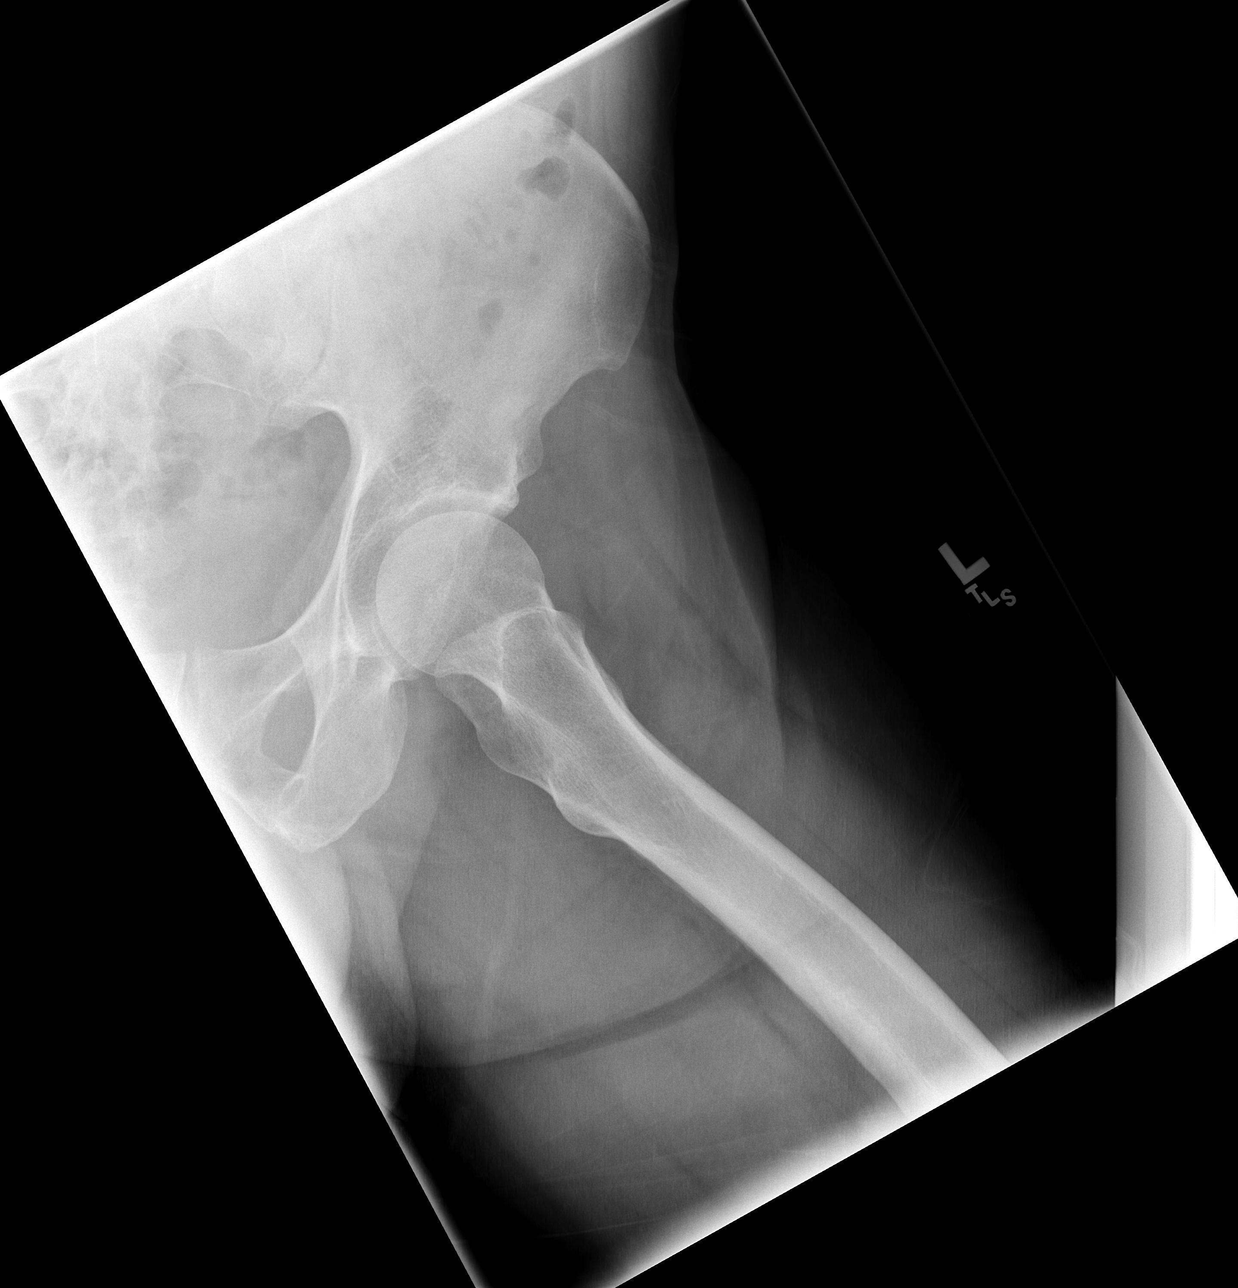

[3 of 3 positions shown; findings below may reference images not displayed]

FINDINGS: There is no evidence of hip fracture or dislocation.
There is no evidence of arthropathy or other focal bone
abnormality.  Lower lumbar spine degenerative changes incidentally
noted.
IMPRESSION: Negative left hip radiographs.

## 2012-10-05 ENCOUNTER — Encounter: Payer: Self-pay | Admitting: *Deleted

## 2013-02-09 ENCOUNTER — Encounter: Payer: Self-pay | Admitting: Internal Medicine

## 2013-02-09 ENCOUNTER — Telehealth: Payer: Self-pay | Admitting: Internal Medicine

## 2013-02-09 NOTE — Telephone Encounter (Signed)
Pt's number belongs to someone else, sent past due letter to set up taylor in June, missed defib with device/mt

## 2013-03-08 ENCOUNTER — Telehealth: Payer: Self-pay | Admitting: Internal Medicine

## 2013-03-08 ENCOUNTER — Encounter: Payer: Self-pay | Admitting: Internal Medicine

## 2013-03-08 NOTE — Telephone Encounter (Addendum)
03-08-13 pt number someone else's, dtr home # d/c, dtr cell not valid, no other numbers, will send past due certified letter/mt 03-27-13 rtn certified mail/mt

## 2013-04-09 ENCOUNTER — Telehealth: Payer: Self-pay | Admitting: Internal Medicine

## 2013-04-09 NOTE — Telephone Encounter (Signed)
03-08-13 sent certified past due letter/mt 03-27-13 certified letter was rtn by mail/mt

## 2015-12-16 ENCOUNTER — Encounter (HOSPITAL_COMMUNITY): Payer: Self-pay | Admitting: *Deleted

## 2015-12-16 DIAGNOSIS — Z87891 Personal history of nicotine dependence: Secondary | ICD-10-CM

## 2015-12-16 DIAGNOSIS — I5042 Chronic combined systolic (congestive) and diastolic (congestive) heart failure: Secondary | ICD-10-CM | POA: Diagnosis present

## 2015-12-16 DIAGNOSIS — Z981 Arthrodesis status: Secondary | ICD-10-CM

## 2015-12-16 DIAGNOSIS — K219 Gastro-esophageal reflux disease without esophagitis: Secondary | ICD-10-CM | POA: Diagnosis present

## 2015-12-16 DIAGNOSIS — L03116 Cellulitis of left lower limb: Principal | ICD-10-CM | POA: Diagnosis present

## 2015-12-16 DIAGNOSIS — Z95810 Presence of automatic (implantable) cardiac defibrillator: Secondary | ICD-10-CM

## 2015-12-16 DIAGNOSIS — I252 Old myocardial infarction: Secondary | ICD-10-CM

## 2015-12-16 DIAGNOSIS — I739 Peripheral vascular disease, unspecified: Secondary | ICD-10-CM | POA: Diagnosis present

## 2015-12-16 DIAGNOSIS — I2510 Atherosclerotic heart disease of native coronary artery without angina pectoris: Secondary | ICD-10-CM | POA: Diagnosis present

## 2015-12-16 DIAGNOSIS — Z951 Presence of aortocoronary bypass graft: Secondary | ICD-10-CM

## 2015-12-16 DIAGNOSIS — F319 Bipolar disorder, unspecified: Secondary | ICD-10-CM | POA: Diagnosis present

## 2015-12-16 DIAGNOSIS — Z7902 Long term (current) use of antithrombotics/antiplatelets: Secondary | ICD-10-CM

## 2015-12-16 DIAGNOSIS — J449 Chronic obstructive pulmonary disease, unspecified: Secondary | ICD-10-CM | POA: Diagnosis present

## 2015-12-16 DIAGNOSIS — I110 Hypertensive heart disease with heart failure: Secondary | ICD-10-CM | POA: Diagnosis present

## 2015-12-16 DIAGNOSIS — E7800 Pure hypercholesterolemia, unspecified: Secondary | ICD-10-CM | POA: Diagnosis present

## 2015-12-16 DIAGNOSIS — M5430 Sciatica, unspecified side: Secondary | ICD-10-CM | POA: Diagnosis present

## 2015-12-16 DIAGNOSIS — Z888 Allergy status to other drugs, medicaments and biological substances status: Secondary | ICD-10-CM

## 2015-12-16 DIAGNOSIS — Z79899 Other long term (current) drug therapy: Secondary | ICD-10-CM

## 2015-12-16 DIAGNOSIS — Z8673 Personal history of transient ischemic attack (TIA), and cerebral infarction without residual deficits: Secondary | ICD-10-CM

## 2015-12-16 DIAGNOSIS — Z91041 Radiographic dye allergy status: Secondary | ICD-10-CM

## 2015-12-16 DIAGNOSIS — Z7982 Long term (current) use of aspirin: Secondary | ICD-10-CM

## 2015-12-16 DIAGNOSIS — Z886 Allergy status to analgesic agent status: Secondary | ICD-10-CM

## 2015-12-16 NOTE — ED Notes (Signed)
Pt c/o left foot swelling x 5 weeks. Pt states that he started getting a purple colors to the toes x 5 days. Redness is now to the top of he left foot. Pt also has purple discoloration to the side of the right foot. States both feet have been numb x 1 week.

## 2015-12-17 ENCOUNTER — Encounter (HOSPITAL_COMMUNITY): Payer: Self-pay | Admitting: Internal Medicine

## 2015-12-17 ENCOUNTER — Inpatient Hospital Stay (HOSPITAL_COMMUNITY): Payer: Medicaid Other

## 2015-12-17 ENCOUNTER — Inpatient Hospital Stay (HOSPITAL_COMMUNITY)
Admission: EM | Admit: 2015-12-17 | Discharge: 2015-12-21 | DRG: 603 | Payer: Medicaid Other | Attending: Internal Medicine | Admitting: Internal Medicine

## 2015-12-17 ENCOUNTER — Emergency Department (HOSPITAL_COMMUNITY): Payer: Medicaid Other

## 2015-12-17 DIAGNOSIS — I10 Essential (primary) hypertension: Secondary | ICD-10-CM | POA: Diagnosis present

## 2015-12-17 DIAGNOSIS — L0291 Cutaneous abscess, unspecified: Secondary | ICD-10-CM

## 2015-12-17 DIAGNOSIS — I5040 Unspecified combined systolic (congestive) and diastolic (congestive) heart failure: Secondary | ICD-10-CM | POA: Insufficient documentation

## 2015-12-17 DIAGNOSIS — E7800 Pure hypercholesterolemia, unspecified: Secondary | ICD-10-CM | POA: Diagnosis present

## 2015-12-17 DIAGNOSIS — Z981 Arthrodesis status: Secondary | ICD-10-CM | POA: Diagnosis not present

## 2015-12-17 DIAGNOSIS — K219 Gastro-esophageal reflux disease without esophagitis: Secondary | ICD-10-CM | POA: Diagnosis present

## 2015-12-17 DIAGNOSIS — J449 Chronic obstructive pulmonary disease, unspecified: Secondary | ICD-10-CM | POA: Diagnosis present

## 2015-12-17 DIAGNOSIS — Z91041 Radiographic dye allergy status: Secondary | ICD-10-CM | POA: Diagnosis not present

## 2015-12-17 DIAGNOSIS — I739 Peripheral vascular disease, unspecified: Secondary | ICD-10-CM | POA: Diagnosis present

## 2015-12-17 DIAGNOSIS — Z951 Presence of aortocoronary bypass graft: Secondary | ICD-10-CM | POA: Diagnosis not present

## 2015-12-17 DIAGNOSIS — I5042 Chronic combined systolic (congestive) and diastolic (congestive) heart failure: Secondary | ICD-10-CM | POA: Diagnosis present

## 2015-12-17 DIAGNOSIS — R609 Edema, unspecified: Secondary | ICD-10-CM | POA: Diagnosis not present

## 2015-12-17 DIAGNOSIS — F319 Bipolar disorder, unspecified: Secondary | ICD-10-CM | POA: Diagnosis present

## 2015-12-17 DIAGNOSIS — Z95810 Presence of automatic (implantable) cardiac defibrillator: Secondary | ICD-10-CM | POA: Diagnosis not present

## 2015-12-17 DIAGNOSIS — L03116 Cellulitis of left lower limb: Principal | ICD-10-CM | POA: Diagnosis present

## 2015-12-17 DIAGNOSIS — M5440 Lumbago with sciatica, unspecified side: Secondary | ICD-10-CM | POA: Diagnosis not present

## 2015-12-17 DIAGNOSIS — Z79899 Other long term (current) drug therapy: Secondary | ICD-10-CM | POA: Diagnosis not present

## 2015-12-17 DIAGNOSIS — M5430 Sciatica, unspecified side: Secondary | ICD-10-CM | POA: Diagnosis present

## 2015-12-17 DIAGNOSIS — J42 Unspecified chronic bronchitis: Secondary | ICD-10-CM

## 2015-12-17 DIAGNOSIS — Z87891 Personal history of nicotine dependence: Secondary | ICD-10-CM | POA: Diagnosis not present

## 2015-12-17 DIAGNOSIS — M549 Dorsalgia, unspecified: Secondary | ICD-10-CM | POA: Diagnosis present

## 2015-12-17 DIAGNOSIS — I2510 Atherosclerotic heart disease of native coronary artery without angina pectoris: Secondary | ICD-10-CM | POA: Diagnosis present

## 2015-12-17 DIAGNOSIS — M79605 Pain in left leg: Secondary | ICD-10-CM | POA: Diagnosis not present

## 2015-12-17 DIAGNOSIS — Z7982 Long term (current) use of aspirin: Secondary | ICD-10-CM | POA: Diagnosis not present

## 2015-12-17 DIAGNOSIS — M79604 Pain in right leg: Secondary | ICD-10-CM | POA: Diagnosis not present

## 2015-12-17 DIAGNOSIS — I252 Old myocardial infarction: Secondary | ICD-10-CM | POA: Diagnosis not present

## 2015-12-17 DIAGNOSIS — Z8673 Personal history of transient ischemic attack (TIA), and cerebral infarction without residual deficits: Secondary | ICD-10-CM | POA: Diagnosis not present

## 2015-12-17 DIAGNOSIS — Z886 Allergy status to analgesic agent status: Secondary | ICD-10-CM | POA: Diagnosis not present

## 2015-12-17 DIAGNOSIS — M5137 Other intervertebral disc degeneration, lumbosacral region: Secondary | ICD-10-CM | POA: Diagnosis present

## 2015-12-17 DIAGNOSIS — M79672 Pain in left foot: Secondary | ICD-10-CM | POA: Diagnosis not present

## 2015-12-17 DIAGNOSIS — J438 Other emphysema: Secondary | ICD-10-CM | POA: Diagnosis not present

## 2015-12-17 DIAGNOSIS — Z7902 Long term (current) use of antithrombotics/antiplatelets: Secondary | ICD-10-CM | POA: Diagnosis not present

## 2015-12-17 DIAGNOSIS — I110 Hypertensive heart disease with heart failure: Secondary | ICD-10-CM | POA: Diagnosis present

## 2015-12-17 DIAGNOSIS — Z888 Allergy status to other drugs, medicaments and biological substances status: Secondary | ICD-10-CM | POA: Diagnosis not present

## 2015-12-17 DIAGNOSIS — M51379 Other intervertebral disc degeneration, lumbosacral region without mention of lumbar back pain or lower extremity pain: Secondary | ICD-10-CM | POA: Diagnosis present

## 2015-12-17 HISTORY — DX: Unspecified combined systolic (congestive) and diastolic (congestive) heart failure: I50.40

## 2015-12-17 HISTORY — DX: Chronic combined systolic (congestive) and diastolic (congestive) heart failure: I50.42

## 2015-12-17 HISTORY — DX: Cellulitis of left lower limb: L03.116

## 2015-12-17 HISTORY — DX: Presence of automatic (implantable) cardiac defibrillator: Z95.810

## 2015-12-17 LAB — CBC WITH DIFFERENTIAL/PLATELET
BASOS ABS: 0 10*3/uL (ref 0.0–0.1)
Basophils Relative: 0 %
EOS ABS: 0.1 10*3/uL (ref 0.0–0.7)
EOS PCT: 1 %
HCT: 40.5 % (ref 39.0–52.0)
Hemoglobin: 13.2 g/dL (ref 13.0–17.0)
LYMPHS PCT: 29 %
Lymphs Abs: 2.1 10*3/uL (ref 0.7–4.0)
MCH: 28.9 pg (ref 26.0–34.0)
MCHC: 32.6 g/dL (ref 30.0–36.0)
MCV: 88.8 fL (ref 78.0–100.0)
Monocytes Absolute: 0.5 10*3/uL (ref 0.1–1.0)
Monocytes Relative: 7 %
Neutro Abs: 4.5 10*3/uL (ref 1.7–7.7)
Neutrophils Relative %: 63 %
PLATELETS: 175 10*3/uL (ref 150–400)
RBC: 4.56 MIL/uL (ref 4.22–5.81)
RDW: 13 % (ref 11.5–15.5)
WBC: 7.3 10*3/uL (ref 4.0–10.5)

## 2015-12-17 LAB — COMPREHENSIVE METABOLIC PANEL
ALT: 15 U/L — AB (ref 17–63)
AST: 20 U/L (ref 15–41)
Albumin: 3.6 g/dL (ref 3.5–5.0)
Alkaline Phosphatase: 107 U/L (ref 38–126)
Anion gap: 11 (ref 5–15)
BUN: 15 mg/dL (ref 6–20)
CHLORIDE: 106 mmol/L (ref 101–111)
CO2: 23 mmol/L (ref 22–32)
CREATININE: 1.21 mg/dL (ref 0.61–1.24)
Calcium: 9.5 mg/dL (ref 8.9–10.3)
GFR calc Af Amer: >60 mL/min (ref >60)
GFR calc non Af Amer: >60 mL/min (ref >60)
Glucose, Bld: 84 mg/dL (ref 65–99)
Potassium: 4.1 mmol/L (ref 3.5–5.1)
SODIUM: 140 mmol/L (ref 135–145)
Total Bilirubin: 0.6 mg/dL (ref 0.3–1.2)
Total Protein: 6.2 g/dL — ABNORMAL LOW (ref 6.5–8.1)

## 2015-12-17 LAB — APTT: aPTT: 32 seconds (ref 24–37)

## 2015-12-17 LAB — C-REACTIVE PROTEIN

## 2015-12-17 LAB — SEDIMENTATION RATE: Sed Rate: 2 mm/hr (ref 0–16)

## 2015-12-17 LAB — HIV ANTIBODY (ROUTINE TESTING W REFLEX): HIV Screen 4th Generation wRfx: NONREACTIVE

## 2015-12-17 LAB — MRSA PCR SCREENING: MRSA BY PCR: NEGATIVE

## 2015-12-17 LAB — PROTIME-INR
INR: 1.13 (ref 0.00–1.49)
PROTHROMBIN TIME: 14.7 s (ref 11.6–15.2)

## 2015-12-17 LAB — I-STAT CG4 LACTIC ACID, ED
Lactic Acid, Venous: 0.85 mmol/L (ref 0.5–2.0)
Lactic Acid, Venous: 0.52 mmol/L (ref 0.5–2.0)

## 2015-12-17 MED ORDER — ONDANSETRON HCL 4 MG/2ML IJ SOLN
INTRAMUSCULAR | Status: AC
  Filled 2015-12-17: qty 2

## 2015-12-17 MED ORDER — SODIUM CHLORIDE 0.9 % IV SOLN
INTRAVENOUS | Status: AC
  Administered 2015-12-17: 07:00:00 via INTRAVENOUS

## 2015-12-17 MED ORDER — HYDROMORPHONE HCL 1 MG/ML IJ SOLN
1.0000 mg | Freq: Once | INTRAMUSCULAR | Status: AC
  Administered 2015-12-17: 1 mg via INTRAVENOUS

## 2015-12-17 MED ORDER — SODIUM CHLORIDE 0.9% FLUSH
3.0000 mL | Freq: Two times a day (BID) | INTRAVENOUS | Status: DC
  Administered 2015-12-17 – 2015-12-21 (×8): 3 mL via INTRAVENOUS

## 2015-12-17 MED ORDER — DIVALPROEX SODIUM 250 MG PO DR TAB
500.0000 mg | DELAYED_RELEASE_TABLET | Freq: Two times a day (BID) | ORAL | Status: DC
  Administered 2015-12-17 – 2015-12-21 (×9): 500 mg via ORAL
  Filled 2015-12-17 (×9): qty 2

## 2015-12-17 MED ORDER — VANCOMYCIN HCL IN DEXTROSE 1-5 GM/200ML-% IV SOLN
1000.0000 mg | Freq: Once | INTRAVENOUS | Status: AC
  Administered 2015-12-17: 1000 mg via INTRAVENOUS
  Filled 2015-12-17: qty 200

## 2015-12-17 MED ORDER — OXYCODONE-ACETAMINOPHEN 5-325 MG PO TABS
1.0000 | ORAL_TABLET | Freq: Four times a day (QID) | ORAL | Status: DC | PRN
  Administered 2015-12-17 – 2015-12-21 (×13): 1 via ORAL
  Filled 2015-12-17 (×13): qty 1

## 2015-12-17 MED ORDER — MAGNESIUM OXIDE 400 (241.3 MG) MG PO TABS
400.0000 mg | ORAL_TABLET | Freq: Every day | ORAL | Status: DC
  Administered 2015-12-17 – 2015-12-21 (×5): 400 mg via ORAL
  Filled 2015-12-17 (×6): qty 1

## 2015-12-17 MED ORDER — CLOPIDOGREL BISULFATE 75 MG PO TABS
75.0000 mg | ORAL_TABLET | Freq: Every day | ORAL | Status: DC
  Administered 2015-12-17 – 2015-12-21 (×5): 75 mg via ORAL
  Filled 2015-12-17 (×5): qty 1

## 2015-12-17 MED ORDER — GABAPENTIN 300 MG PO CAPS
600.0000 mg | ORAL_CAPSULE | Freq: Three times a day (TID) | ORAL | Status: DC
  Administered 2015-12-17 – 2015-12-21 (×13): 600 mg via ORAL
  Filled 2015-12-17 (×3): qty 2
  Filled 2015-12-17: qty 6
  Filled 2015-12-17 (×8): qty 2
  Filled 2015-12-17: qty 6

## 2015-12-17 MED ORDER — ALUM & MAG HYDROXIDE-SIMETH 200-200-20 MG/5ML PO SUSP: 30.0000 mL | Freq: Four times a day (QID) | ORAL | Status: DC | PRN

## 2015-12-17 MED ORDER — PIPERACILLIN-TAZOBACTAM 3.375 G IVPB
3.3750 g | Freq: Three times a day (TID) | INTRAVENOUS | Status: DC
  Administered 2015-12-17 – 2015-12-21 (×12): 3.375 g via INTRAVENOUS
  Filled 2015-12-17 (×14): qty 50

## 2015-12-17 MED ORDER — HYDROXYZINE HCL 25 MG PO TABS
25.0000 mg | ORAL_TABLET | Freq: Every evening | ORAL | Status: DC
  Administered 2015-12-17 – 2015-12-20 (×4): 25 mg via ORAL
  Filled 2015-12-17 (×4): qty 1

## 2015-12-17 MED ORDER — ALBUTEROL SULFATE (2.5 MG/3ML) 0.083% IN NEBU: 2.5000 mg | INHALATION_SOLUTION | Freq: Two times a day (BID) | RESPIRATORY_TRACT | Status: DC | PRN

## 2015-12-17 MED ORDER — HYDROMORPHONE HCL 1 MG/ML IJ SOLN
INTRAMUSCULAR | Status: AC
  Filled 2015-12-17: qty 1

## 2015-12-17 MED ORDER — ONDANSETRON HCL 4 MG/2ML IJ SOLN
4.0000 mg | Freq: Once | INTRAMUSCULAR | Status: AC
  Administered 2015-12-17: 4 mg via INTRAVENOUS

## 2015-12-17 MED ORDER — PIPERACILLIN-TAZOBACTAM 3.375 G IVPB 30 MIN
3.3750 g | Freq: Once | INTRAVENOUS | Status: AC
  Administered 2015-12-17: 3.375 g via INTRAVENOUS
  Filled 2015-12-17: qty 50

## 2015-12-17 MED ORDER — VANCOMYCIN HCL IN DEXTROSE 1-5 GM/200ML-% IV SOLN
1000.0000 mg | Freq: Two times a day (BID) | INTRAVENOUS | Status: DC
  Administered 2015-12-17 – 2015-12-21 (×8): 1000 mg via INTRAVENOUS
  Filled 2015-12-17 (×9): qty 200

## 2015-12-17 MED ORDER — HYDRALAZINE HCL 20 MG/ML IJ SOLN: 5.0000 mg | INTRAMUSCULAR | Status: DC | PRN

## 2015-12-17 MED ORDER — ACETAMINOPHEN 650 MG RE SUPP: 650.0000 mg | Freq: Four times a day (QID) | RECTAL | Status: DC | PRN

## 2015-12-17 MED ORDER — ENOXAPARIN SODIUM 40 MG/0.4ML ~~LOC~~ SOLN
40.0000 mg | Freq: Every day | SUBCUTANEOUS | Status: DC
  Administered 2015-12-17 – 2015-12-21 (×5): 40 mg via SUBCUTANEOUS
  Filled 2015-12-17 (×6): qty 0.4

## 2015-12-17 MED ORDER — ATORVASTATIN CALCIUM 80 MG PO TABS
80.0000 mg | ORAL_TABLET | Freq: Every day | ORAL | Status: DC
  Administered 2015-12-17 – 2015-12-21 (×5): 80 mg via ORAL
  Filled 2015-12-17: qty 1
  Filled 2015-12-17: qty 8
  Filled 2015-12-17 (×2): qty 1
  Filled 2015-12-17: qty 2

## 2015-12-17 MED ORDER — ONDANSETRON HCL 4 MG/2ML IJ SOLN: 4.0000 mg | Freq: Three times a day (TID) | INTRAMUSCULAR | Status: DC | PRN

## 2015-12-17 MED ORDER — IPRATROPIUM-ALBUTEROL 0.5-2.5 (3) MG/3ML IN SOLN
3.0000 mL | Freq: Two times a day (BID) | RESPIRATORY_TRACT | Status: DC
  Administered 2015-12-17 (×2): 3 mL via RESPIRATORY_TRACT
  Filled 2015-12-17 (×4): qty 3

## 2015-12-17 MED ORDER — MORPHINE SULFATE (PF) 2 MG/ML IV SOLN
2.0000 mg | INTRAVENOUS | Status: DC | PRN
  Administered 2015-12-17: 2 mg via INTRAVENOUS
  Filled 2015-12-17: qty 1

## 2015-12-17 MED ORDER — MECLIZINE HCL 25 MG PO TABS: 25.0000 mg | ORAL_TABLET | Freq: Three times a day (TID) | ORAL | Status: DC | PRN

## 2015-12-17 MED ORDER — ASPIRIN 81 MG PO CHEW
81.0000 mg | CHEWABLE_TABLET | Freq: Every day | ORAL | Status: DC
  Administered 2015-12-17 – 2015-12-21 (×5): 81 mg via ORAL
  Filled 2015-12-17 (×5): qty 1

## 2015-12-17 MED ORDER — VANCOMYCIN HCL 10 G IV SOLR
1500.0000 mg | INTRAVENOUS | Status: AC
  Administered 2015-12-17: 1500 mg via INTRAVENOUS
  Filled 2015-12-17: qty 1500

## 2015-12-17 MED ORDER — ESCITALOPRAM OXALATE 10 MG PO TABS
15.0000 mg | ORAL_TABLET | Freq: Every evening | ORAL | Status: DC
  Administered 2015-12-17 – 2015-12-20 (×4): 15 mg via ORAL
  Filled 2015-12-17 (×4): qty 2

## 2015-12-17 MED ORDER — IPRATROPIUM-ALBUTEROL 0.5-2.5 (3) MG/3ML IN SOLN: 3.0000 mL | Freq: Two times a day (BID) | RESPIRATORY_TRACT | Status: DC | PRN

## 2015-12-17 MED ORDER — OXYCODONE-ACETAMINOPHEN 5-325 MG PO TABS
2.0000 | ORAL_TABLET | Freq: Four times a day (QID) | ORAL | Status: DC | PRN
  Administered 2015-12-17 (×2): 2 via ORAL
  Filled 2015-12-17 (×2): qty 2

## 2015-12-17 MED ORDER — ACETAMINOPHEN 325 MG PO TABS
650.0000 mg | ORAL_TABLET | Freq: Four times a day (QID) | ORAL | Status: DC | PRN
Start: 2015-12-17 — End: 2015-12-21
  Administered 2015-12-17: 650 mg via ORAL
  Filled 2015-12-17: qty 2

## 2015-12-17 MED ORDER — SODIUM CHLORIDE 0.9 % IV BOLUS (SEPSIS)
1000.0000 mL | Freq: Once | INTRAVENOUS | Status: AC
  Administered 2015-12-17: 1000 mL via INTRAVENOUS

## 2015-12-17 MED ORDER — LORATADINE 10 MG PO TABS
10.0000 mg | ORAL_TABLET | Freq: Every day | ORAL | Status: DC
  Administered 2015-12-17 – 2015-12-21 (×5): 10 mg via ORAL
  Filled 2015-12-17 (×4): qty 1

## 2015-12-17 MED ORDER — PANTOPRAZOLE SODIUM 40 MG PO TBEC
40.0000 mg | DELAYED_RELEASE_TABLET | Freq: Every day | ORAL | Status: DC
  Administered 2015-12-17 – 2015-12-21 (×5): 40 mg via ORAL
  Filled 2015-12-17 (×4): qty 1

## 2015-12-17 NOTE — ED Notes (Signed)
Heart healthy lunch tray ordered for patient.  

## 2015-12-17 NOTE — ED Notes (Signed)
Patient transported to vascular. 

## 2015-12-17 NOTE — Progress Notes (Signed)
Pharmacy Antibiotic Note  [name] is a 65 y.o. male admitted on 12/17/2015 with cellulitis.  Pharmacy has been consulted for Vancomycin and Zosyn dosing. Pt received Vanc 1.5gm IV [id number] in ED.  Plan: Zosyn 3.375gm IV now over 30 min then 3.375gm IV q8h - subsequent doses over 4 hours Vancomycin 1gm IV q12h Will f/u micro data, renal function, and pt's clinical condition Vanc trough prn   Height: 6' (182.9 cm) Weight: 192 lb (87.091 kg) IBW/kg (Calculated) : 77.6  Temp (24hrs), Avg:98.2 F (36.8 C), Min:98.1 F (36.7 C), Max:98.3 F (36.8 C)   Recent Labs Lab 12/17/15 0322 12/17/15 0353  WBC 7.3  --   CREATININE 1.21  --   LATICACIDVEN  --  0.85    Estimated Creatinine Clearance: 69.5 mL/min (by C-G formula based on Cr of 1.21).    Allergies  Allergen Reactions  . Amitriptyline Hcl     unknown  . Codeine     unknown  . Iohexol      Desc: PT STATES THAT HE HAS CONVULSIONS/SEIZURES S/P CONTRAST.  PT WAS GIVEN 1 HR PREMEDS AND HAD NO PROBLEMS.  [name] RT-RCT, Onset Date: [id number]   . Ivp Dye [Iodinated Diagnostic Agents]     unknown  . Nifedipine     unknown    Antimicrobials this admission: 3/8 Vanc >>  3/8 zosyn >>   Dose adjustments this admission: n/a  Microbiology results: 3/8 BCx:   Thank you for allowing pharmacy to be a part of this patient's care.  [name], PharmD, BCPS Clinical pharmacist, pager [phone] 12/17/2015 5:54 AM

## 2015-12-17 NOTE — ED Notes (Signed)
Breakfast tray delivered

## 2015-12-17 NOTE — ED Provider Notes (Signed)
TIME SEEN: 2:50 AM  CHIEF COMPLAINT:  Chief Complaint  Patient presents with  . Foot Swelling    HPI:  HPI Comments: [name] is a 65 y.o. male with a pmhx of HTN, CAD status post CABG, AAA, Stroke, pacemaker/defibrillator, and COPD, who presents to the Emergency Department complaining of left foot swelling x5 weeks with associated purple discoloration to the toes x5 days. There is also slight right foot purple discoloration for the past week.Marland Kitchen He is also noticed redness from his left foot up into his left calf the past several days. He is not on antibiotics. Pt has been in a wheelchair for 4 years due to chronic back pain. Denies new numbness or focal weakness. States he has had months worth of tingling in his bilateral feet that is unchanged. Complaining of chronic back pain without any new changes. Has had history of prior back surgery. Denies fever. No new injury to the back or lower extremity is. Pt is in prison and was hence unable to come to the ED earlier when sx first began. Pt has no hx of DVT/PE. Pt is currently taking Plavix.    ROS: See HPI Constitutional: no fever  Eyes: no drainage  ENT: no runny nose   Cardiovascular:  no chest pain  Resp: no SOB  GI: no vomiting GU: no dysuria Integumentary: no rash  Allergy: no hives  Musculoskeletal: Left  leg swelling  Neurological: no slurred speech ROS otherwise negative  PAST MEDICAL HISTORY/PAST SURGICAL HISTORY:  Past Medical History  Diagnosis Date  . Coronary artery disease     bypass grafting. Left internal mammary artery to left anterior descending   . Hypertension   . Hypercholesteremia   . Periodic limb movement disorder   . Hypersomnia, unspecified   . Syncope and collapse   . Tobacco use disorder   . Degeneration of lumbar or lumbosacral intervertebral disc   . Personal history of other diseases of digestive system   . Cocaine abuse     hx of   . Alcohol abuse     hx of  . Back pain, chronic   .  Bipolar disorder, unspecified (HCC)   . Aortocoronary bypass status 1996    surgery  . Defibrillator activation   . History of ventricular tachycardia      status post automatic implantable cardioverter defibrillator/pacer  . Stroke [facility])     Subacute right pontine stroke due to lacune and small-vessel disease   . AAA (abdominal aortic aneurysm) (HCC)   . History of gastrointestinal bleeding   . Myocardial infarction (HCC)     hx of; sees [place]  . MI (myocardial infarction) [facility]) 1989 & 2006    with LV dysfunction  . Pacemaker   . ICD (implantable cardiac defibrillator) in place   . Heart murmur   . COPD (chronic obstructive pulmonary disease) (HCC)   . Shortness of breath   . GERD (gastroesophageal reflux disease)     MEDICATIONS:  Prior to Admission medications   Medication Sig Start Date End Date Taking? Authorizing Provider  albuterol (PROVENTIL) (2.5 MG/3ML) 0.083% nebulizer solution Take 2.5 mg by nebulization every 6 (six) hours as needed. For shortness of breath.    Historical Provider, MD  aspirin 81 MG chewable tablet Chew 81 mg by mouth daily.    Historical Provider, MD  beclomethasone (QVAR) 40 MCG/ACT inhaler Inhale 2 puffs into the lungs daily.     Historical Provider, MD  cyclobenzaprine (FLEXERIL) 10 MG  tablet Take 10 mg by mouth 3 (three) times daily as needed. For muscle spasms.    Historical Provider, MD  HYDROmorphone HCl (EXALGO) 32 MG TB24 Take 32 mg by mouth daily.     Historical Provider, MD  lisinopril (PRINIVIL,ZESTRIL) 40 MG tablet Take 40 mg by mouth daily. 11/23/11 11/22/12  Joseph ArtJessica U Vann, DO  metoprolol (LOPRESSOR) 50 MG tablet Take 50 mg by mouth 2 (two) times daily.     Historical Provider, MD  nitroGLYCERIN (NITROSTAT) 0.4 MG SL tablet Place 0.4 mg under the tongue every 5 (five) minutes x 3 doses as needed. For chest pain.    Historical Provider, MD  omeprazole (PRILOSEC) 20 MG capsule Take 20 mg by mouth daily.     Historical Provider, MD   oxyCODONE (OXYCONTIN) 15 MG TB12 Take 15 mg by mouth every 6 (six) hours.    Historical Provider, MD  simvastatin (ZOCOR) 20 MG tablet Take 20 mg by mouth at bedtime.     Historical Provider, MD  tiotropium (SPIRIVA) 18 MCG inhalation capsule Place 18 mcg into inhaler and inhale daily.     Historical Provider, MD    ALLERGIES:  Allergies  Allergen Reactions  . Amitriptyline Hcl     unknown  . Codeine     unknown  . Iohexol      Desc: PT STATES THAT HE HAS CONVULSIONS/SEIZURES S/P CONTRAST.  PT WAS GIVEN 1 HR PREMEDS AND HAD NO PROBLEMS.  STEPHANIE DAVIS RT-RCT, Onset Date: [id number]   . Ivp Dye [Iodinated Diagnostic Agents]     unknown  . Nifedipine     unknown    SOCIAL HISTORY:  Social History  Substance Use Topics  . Smoking status: Former Smoker -- 0.33 packs/day    Types: Cigarettes  . Smokeless tobacco: Never Used     Comment: pt states that he is trying to quit, smoking cessation info given and reviewed  . Alcohol Use: No    FAMILY HISTORY: Family History  Problem Relation Age of Onset  . Heart disease Mother 6360    deceased  . Heart disease Father 5070    deceased  . Heart disease Brother     alive  . Heart attack Mother   . Heart attack Father   . GI problems Sister     bleed    EXAM: BP 161/94 mmHg  Pulse 81  Temp(Src) 98.3 F (36.8 C) (Oral)  Resp 18  Ht 6' (1.829 m)  Wt 192 lb (87.091 kg)  BMI 26.03 kg/m2  SpO2 94% CONSTITUTIONAL: Alert and oriented and responds appropriately to questions. Well-appearing, appears uncomfortable HEAD: Normocephalic EYES: Conjunctivae clear, PERRL ENT: normal nose; no rhinorrhea; moist mucous membranes NECK: Supple, no meningismus, no LAD  CARD: RRR; S1 and S2 appreciated; no murmurs, no clicks, no rubs, no gallops RESP: Normal chest excursion without splinting or tachypnea; breath sounds clear and equal bilaterally; no wheezes, no rhonchi, no rales, no hypoxia or respiratory distress, speaking full  sentences ABD/GI: Normal bowel sounds; non-distended; soft, non-tender, no rebound, no guarding, no peritoneal signs BACK:  The back appears normal and is non-tender to palpation, there is no CVA tenderness EXT: Patient has erythema of the left foot to the left proximal calf with mild associated swelling in this leg. No joint effusions. Compartments are soft. There is some purple discoloration of his left toes and also of the right lateral foot without bony deformity. Extremities are tender to palpation in bilateral feet. He reports normal sensation  when palpated but describes paresthesias in bilateral toes. Normal ROM in all joints; right extremity is are non-tender to palpation; no edema; normal capillary refill; no cyanosis, no right-sided calf tenderness or swelling; I am able to palpate and Doppler a bilateral DP and PT pulse; 2+ palpable femoral pulses bilaterally, extremities seem warm and well-perfused; no blisters or bulla, no subcutaneous air or crepitus SKIN: Normal color for age and race; warm; no rash NEURO: Moves all extremities equally, sensation to light touch intact diffusely, cranial nerves II through XII intact, patient is able to ambulate PSYCH: The patient's mood and manner are appropriate. Grooming and personal hygiene are appropriate.  MEDICAL DECISION MAKING: Patient here with left lower extremity cellulitis and purple discoloration to the left toes and right lateral foot. No obvious sign of gangrene. No crepitus or subcutaneous air. I am able to palpate a DP and PT pulse bilaterally as well as femoral pulses. No abscess on exam. Unable to identify symptoms of claudication given patient does not normally ambulate. We'll give IV antibiotics and IV pain medication. Will obtain labs, cultures. I feel he will need ABI in the AM.    ED PROGRESS: His labs are unremarkable. X-ray show no signs of osteomyelitis, soft tissue gas or focal injury. He has received IV antibiotics. Discussed with  Dr. Zane Herald who agrees with admission. I do not feel vascular surgery needs to be consult emergently given he does have pulses but again I feel he needs to have ABIs in the AM.  I do not feel he needs to be on emergent anticoagulation at this time. We will use holding orders per hospitalist request. Will admit to telemetry, inpatient. Care transferred to hospitalist service. I will keep him nothing by mouth until after ABI results back.   EKG Interpretation  Date/Time:  Wednesday December 17 2015 06:07:41 EST Ventricular Rate:  80 PR Interval:  215 QRS Duration: 157 QT Interval:  450 QTC Calculation: 519 R Axis:   34 Text Interpretation:  Sinus rhythm Borderline prolonged PR interval Right bundle branch block Inferior infarct, old Abnormal lateral Q waves No significant change since last tracing in 2013 Confirmed by Meria Crilly,  DO, Vallie Teters [phone]) on 12/17/2015 6:29:53 AM       I personally performed the services described in this documentation, which was scribed in my presence. The recorded information has been reviewed and is accurate.    Layla Maw Jarmarcus Wambold, DO 12/17/15 [phone]

## 2015-12-17 NOTE — ED Notes (Signed)
Report attempted 

## 2015-12-17 NOTE — Progress Notes (Signed)
Vital stable, labs unremarkable, Ct lumber spine done, result pending, ABI /venous doppler ordered. Continue iv abx, total ivf for 12hrs, patient eating well, monitor volume status, (chf 50%), elevate left leg.

## 2015-12-17 NOTE — H&P (Addendum)
[facility] History and Physical  [name]:[id number] DOB: 03/22/1951 DOA: 12/17/2015  Referring physician: ED physician PCP: Helen Hashimoto., MD  Specialists:   Chief Complaint: left foot pain and lower back pain  HPI: [name] is a 65 y.o. male with PMH of hypertension, hyperlipidemia, COPD, GERD, ICD pacemaker, CAD, S/P CABG, former cocaine abuser, former alcohol abuser, former alcohol abuser, bipolar disorder, AAA, stroke, combined systolic and diastolic congestive heart failure, who presents with left foot pain and lower back pain.  Pt is from jail and accompanied by policeman. Pt reports that he has left foot pain and swelling for about 5 weeks. It has been progressively getting worse. His left foot become erythematous. Pt states that he started getting a purple colors to the toes for 1 week. Pt also has purple discoloration to the side of the right foot, but no right foot pain or swelling. He denies any injury to his feet. He also states that he has worsening lower back pain, which is radiating down to the posterior legs bilaterally, causing numbness in lower legs and feet. No leg weakness. No urinary incontinence or loss of control bowel movement. Patient does not have fever, chills, chest pain, cough, shortness of breath, abdominal pain, nausea, vomiting, diarrhea, symptoms of UTI or unilateral weakness.  In ED, patient was found to have lactate 0.85, WBC 7.3, temperature normal, no tachycardia, electrolytes okay, creatinine 1.21. X-ray of her right foot is negative for acute abnormalities. X-ray of left foot showed linear metallic foreign bodyin the soft tissues over the plantar aspect of the proximal second left toe. No acute bony abnormalities. Patient admitted to inpatient for further evaluation and treatment.  EKG: Not done in ED, will get one.   Where does patient live? Jail Can patient participate in ADLs?  Some   Review of Systems:   General: no  fevers, chills, no changes in body weight, has fatigue HEENT: no blurry vision, hearing changes or sore throat Pulm: no dyspnea, coughing, wheezing CV: no chest pain, no palpitations Abd: no nausea, vomiting, abdominal pain, diarrhea, constipation GU: no dysuria, burning on urination, increased urinary frequency, hematuria  Ext: no leg edema. Has left foot swelling and pain. Neuro: Has numbness in both lower legs and feet. Skin: Has purpled dicoloration in both feet. MSK: has  Heme: No easy bruising.  Travel history: No recent long distant travel.  Allergy:  Allergies  Allergen Reactions  . Amitriptyline Hcl     unknown  . Codeine     unknown  . Iohexol      Desc: PT STATES THAT HE HAS CONVULSIONS/SEIZURES S/P CONTRAST.  PT WAS GIVEN 1 HR PREMEDS AND HAD NO PROBLEMS.  STEPHANIE DAVIS RT-RCT, Onset Date: [id number]   . Ivp Dye [Iodinated Diagnostic Agents]     unknown  . Nifedipine     unknown    Past Medical History  Diagnosis Date  . Coronary artery disease     bypass grafting. Left internal mammary artery to left anterior descending   . Hypertension   . Hypercholesteremia   . Periodic limb movement disorder   . Hypersomnia, unspecified   . Syncope and collapse   . Tobacco use disorder   . Degeneration of lumbar or lumbosacral intervertebral disc   . Personal history of other diseases of digestive system   . Cocaine abuse     hx of   . Alcohol abuse     hx of  . Back pain, chronic   .  Bipolar disorder, unspecified ([place])   . Aortocoronary bypass status 1996    surgery  . Defibrillator activation   . History of ventricular tachycardia      status post automatic implantable cardioverter defibrillator/pacer  . Stroke [facility])     Subacute right pontine stroke due to lacune and small-vessel disease   . AAA (abdominal aortic aneurysm) ([place])   . History of gastrointestinal bleeding   . Myocardial infarction ([place])     hx of; sees [place]  . MI (myocardial infarction)  [facility]) 1989 & 2006    with LV dysfunction  . Pacemaker   . ICD (implantable cardiac defibrillator) in place   . Heart murmur   . COPD (chronic obstructive pulmonary disease) ([place])   . Shortness of breath   . GERD (gastroesophageal reflux disease)   . Combined systolic and diastolic congestive heart failure ([place])   . Chronic combined systolic and diastolic congestive heart failure [facility])     Past Surgical History  Procedure Laterality Date  . Aortocoronary byp    . Icd pla    . Cardiac defibrillator placement  10/23/2007     Medtronic Maximo single chamber cardioverter-defibrillator  . Coronary artery bypass graft  2007    in [place], [place] -- LIMA to LAD, SVG to diagonal, SVG to RCA, SVG to circumflex (saphenous vein graft to circumflex totaled at catheterization in 2006)  . Cardiac catheterization  05/14/2006    Est EF of over 50%  . Cardiac catheterization      Est EF of 50% --   . Cardiac catheterization  09/03/2008    Continued patency of the internal mammary to the LAD -- of the saphenous vein graft to the diagonal -- of the saphenous vein graft to PDA -- Occluded saphenous vein graft to the circumflex with essentially widely patent circumflex vessel  . Lumbar fusion      L5-S1 transforaminal lumbar interbody fusion with contralateral posterolateral fusion at the same level -- Posterolateral fusion T10-11, T11-12, T12-L1, L1-L2, L2-L3, L3-L4, L4-L5 --   . Lumbar laminectomy/decompression microdiscectomy  11/26/2010    Laminectomy with decompression of the spinal cord C3-4, C4-5, C5-6,C6-7  . Cervical fusion      Social History:  reports that he has quit smoking. His smoking use included Cigarettes. He smoked 0.33 packs per day. He has never used smokeless tobacco. He reports that he does not drink alcohol or use illicit drugs.  Family History:  Family History  Problem Relation Age of Onset  . Heart disease Mother 60    deceased  . Heart disease Father 55     deceased  . Heart disease Brother     alive  . Heart attack Mother   . Heart attack Father   . GI problems Sister     bleed     Prior to Admission medications   Medication Sig Start Date End Date Taking? Authorizing Provider  albuterol (PROVENTIL) (2.5 MG/3ML) 0.083% nebulizer solution Take 2.5 mg by nebulization 2 (two) times daily as needed for wheezing or shortness of breath. For shortness of breath.   Yes Historical Provider, MD  aspirin 81 MG chewable tablet Chew 81 mg by mouth daily.   Yes Historical Provider, MD  atorvastatin (LIPITOR) 80 MG tablet Take 80 mg by mouth daily.   Yes Historical Provider, MD  clopidogrel (PLAVIX) 75 MG tablet Take 75 mg by mouth daily.   Yes Historical Provider, MD  divalproex (DEPAKOTE) 500 MG DR tablet Take 500  mg by mouth 2 (two) times daily.   Yes Historical Provider, MD  escitalopram (LEXAPRO) 5 MG tablet Take 15 mg by mouth every evening.   Yes Historical Provider, MD  gabapentin (NEURONTIN) 600 MG tablet Take 600 mg by mouth 3 (three) times daily.   Yes Historical Provider, MD  hydrochlorothiazide (HYDRODIURIL) 25 MG tablet Take 25 mg by mouth daily.   Yes Historical Provider, MD  hydrOXYzine (VISTARIL) 25 MG capsule Take 25 mg by mouth every evening.   Yes Historical Provider, MD  ipratropium-albuterol (DUONEB) 0.5-2.5 (3) MG/3ML SOLN Take 3 mLs by nebulization 2 (two) times daily as needed (shortness of breath).   Yes Historical Provider, MD  loratadine (CLARITIN) 10 MG tablet Take 10 mg by mouth daily.   Yes Historical Provider, MD  magnesium oxide (MAG-OX) 400 (241.3 Mg) MG tablet Take 400 mg by mouth daily.   Yes Historical Provider, MD  meclizine (ANTIVERT) 25 MG tablet Take 25 mg by mouth 3 (three) times daily as needed for dizziness.   Yes Historical Provider, MD  pantoprazole (PROTONIX) 40 MG tablet Take 40 mg by mouth daily.   Yes Historical Provider, MD  traMADol (ULTRAM) 50 MG tablet Take 50 mg by mouth 3 (three) times daily as needed  for moderate pain.   Yes Historical Provider, MD    Physical Exam: Filed Vitals:   12/17/15 0445 12/17/15 0500 12/17/15 0530 12/17/15 0600  BP: 140/86 114/78 124/73 129/76  Pulse: 85 77 81 80  Temp:      TempSrc:      Resp:  '16 17 16  ' Height:      Weight:      SpO2: 90% 94% 95% 95%   General: Not in acute distress HEENT:       Eyes: PERRL, EOMI, no scleral icterus.       ENT: No discharge from the ears and nose, no pharynx injection, no tonsillar enlargement.        Neck: No JVD, no bruit, no mass felt. Heme: No neck lymph node enlargement. Cardiac: S1/S2, RRR, No murmurs, No gallops or rubs. Pulm: No rales, wheezing, rhonchi or rubs. Abd: Soft, nondistended, nontender, no rebound pain, no organomegaly, BS present. Ext: No pitting leg edema bilaterally. 2+DP/PT pulse bilaterally which is confirmed by portable Doppler. Left foot is swelling, warm, erythematous and tender, extending to the lower left leg. Musculoskeletal: No joint deformities, no limitation of ROM in spin. Has tenderness over the midline of lower back. Skin: No rashes.  Neuro: Alert, oriented X3, cranial nerves II-XII grossly intact, moves all extremities normally. Muscle strength 5/5 in all extremities, sensation to light touch intact. Brachial reflex 2+ bilaterally. Knee reflex 1+ bilaterally. Negative Babinski's sign. Normal finger to nose test. Psych: Patient is not psychotic, no suicidal or hemocidal ideation.  Labs on Admission:  Basic Metabolic Panel:  Recent Labs Lab 12/17/15 0322  NA 140  K 4.1  CL 106  CO2 23  GLUCOSE 84  BUN 15  CREATININE 1.21  CALCIUM 9.5   Liver Function Tests:  Recent Labs Lab 12/17/15 0322  AST 20  ALT 15*  ALKPHOS 107  BILITOT 0.6  PROT 6.2*  ALBUMIN 3.6   No results for input(s): LIPASE, AMYLASE in the last 168 hours. No results for input(s): AMMONIA in the last 168 hours. CBC:  Recent Labs Lab 12/17/15 0322  WBC 7.3  NEUTROABS 4.5  HGB 13.2  HCT 40.5   MCV 88.8  PLT 175   Cardiac Enzymes:  No results for input(s): CKTOTAL, CKMB, CKMBINDEX, TROPONINI in the last 168 hours.  BNP (last 3 results) No results for input(s): BNP in the last 8760 hours.  ProBNP (last 3 results) No results for input(s): PROBNP in the last 8760 hours.  CBG: No results for input(s): GLUCAP in the last 168 hours.  Radiological Exams on Admission: Dg Foot Complete Left  12/17/2015  CLINICAL DATA:  Left foot swelling and discoloration for 5 weeks. EXAM: LEFT FOOT - COMPLETE 3+ VIEW COMPARISON:  None. FINDINGS: Small linear metallic foreign body demonstrated in the soft tissues over the plantar aspect of the proximal left second toe. No evidence of acute fracture or dislocation. No focal bone lesion or bone destruction. Bone cortex appears intact. Surgical clips in the left lower leg. IMPRESSION: Linear metallic foreign body demonstrated in the soft tissues over the plantar aspect of the proximal second left toe. No acute bony abnormalities. Electronically Signed   By: Lucienne Capers M.D.   On: 12/17/2015 04:45   Dg Foot Complete Right  12/17/2015  CLINICAL DATA:  Right foot purple discoloration in numbness to the feet for 1 week. EXAM: RIGHT FOOT COMPLETE - 3+ VIEW COMPARISON:  None. FINDINGS: There is no evidence of fracture or dislocation. There is no evidence of arthropathy or other focal bone abnormality. Soft tissues are unremarkable. IMPRESSION: Negative. Electronically Signed   By: Lucienne Capers M.D.   On: 12/17/2015 04:46    Assessment/Plan Principal Problem:   Cellulitis of left foot Active Problems:   HYPERCHOLESTEROLEMIA   BIPOLAR DISORDER UNSPECIFIED   Essential hypertension   Coronary atherosclerosis   DEGENERATIVE DISC DISEASE, LUMBAR SPINE   Backache   Automatic implantable cardioverter-defibrillator in situ   Chronic combined systolic and diastolic congestive heart failure (HCC)   COPD (chronic obstructive pulmonary disease)  (HCC)   Cellulitis of left foot: Patient has tenderness, warmth and erythema in the left foot, consistent with cellulitis. The purple discoloration of feet is likely due to bruise, rather than ischemia given good pulses in both feet. Patient is not septic. Lactate is normal. Hemodynamically stable.  will admit to tele bed - Empiric antimicrobial treatment with vancomycin and Zosyn per pharmacy - PRN Zofran for nausea, morphine and Percocet for pain - Blood cultures x 2  - ESR and CRP - trend lactic acid levels - IVF: 1.0 L of NS bolus in ED, followed by 75 cc/h - f/u ABI result ordered by EDP  Back pain: s/p lumbar fusion. Now presents with worsening back pain, has sciatica feature with numbness in both legs. -will get CT-lumbar spin without contrast (pt is allergic to iodine, cannot do MRI due to present of pacemaker) -Pain control with when necessary Percocet  HLD: Last LDL was 73 on 11/22/11 -Continue home medications: Lipitor  BIPOLAR DISORDER UNSPECIFIED: -Continue home medications: Depakote, Lexapro  Hypertension: Blood pressure 114/78 -Hold HCTZ since patient needs IV fluid due to the risk of developing sepsis -IV hydralazine when necessary  CAD: S/P of CABG. No chest pain -Continue aspirin, Lipitor, Plavix  GERD: -Protonix  Hx of combined systolic and diastolic congestive heart failure ([place]): 2-D echo on 11/22/11 showed EF 30-35% with grade 2 diastolic dysfunction. Patient is on HCTZ. No leg edema or JVD. CHF is compensated. -Check BNP -Continue aspirin  COPD: stable. -When necessary alternate her nebulizer and DuoNeb nebulizers   DVT ppx:  SQ Lovenox  Code Status: Full code Family Communication:  Yes, police man at bed side Disposition Plan: Admit to  inpatient   Date of Service 12/17/2015    Ivor Costa [facility] Pager [phone]  If 7PM-7AM, please contact night-coverage [email or web address] Password [facility] 12/17/2015, 6:19 AM

## 2015-12-17 NOTE — Progress Notes (Signed)
VASCULAR LAB PRELIMINARY  PRELIMINARY  PRELIMINARY  PRELIMINARY  Bilateral lower extremity venous duplex completed.      Bilateral:  No evidence of DVT, superficial thrombosis, or Baker's Cyst.   Jenetta Logesami Zahki Hoogendoorn, RVT, RDMS 12/17/2015, 9:17 AM

## 2015-12-17 NOTE — ED Notes (Signed)
Patient transported to CT 

## 2015-12-17 NOTE — Progress Notes (Signed)
VASCULAR LAB PRELIMINARY  ARTERIAL  ABI completed:    RIGHT    LEFT    PRESSURE WAVEFORM  PRESSURE WAVEFORM  BRACHIAL 152 Triphasic BRACHIAL 136 Triphasic  DP 142 Triphasic DP 88 Monophasic  PT 146 Triphasic PT 106 Monophasic    RIGHT LEFT  ABI 0.96 0.70   ABI's are with in normal limits in the right leg at rest.  Left leg-ABI's is suggestive Moderate arterial disease at rest. Exam difficult due to patient movement especially the left leg.  Jenetta Logesami Waniya Hoglund, RVT 12/17/2015, 10:06 AM

## 2015-12-18 DIAGNOSIS — Z95810 Presence of automatic (implantable) cardiac defibrillator: Secondary | ICD-10-CM

## 2015-12-18 DIAGNOSIS — I5042 Chronic combined systolic (congestive) and diastolic (congestive) heart failure: Secondary | ICD-10-CM

## 2015-12-18 LAB — CBC
HCT: 39 % (ref 39.0–52.0)
Hemoglobin: 13 g/dL (ref 13.0–17.0)
MCH: 30.2 pg (ref 26.0–34.0)
MCHC: 33.3 g/dL (ref 30.0–36.0)
MCV: 90.7 fL (ref 78.0–100.0)
PLATELETS: 153 10*3/uL (ref 150–400)
RBC: 4.3 MIL/uL (ref 4.22–5.81)
RDW: 13.1 % (ref 11.5–15.5)
WBC: 5.2 10*3/uL (ref 4.0–10.5)

## 2015-12-18 LAB — BASIC METABOLIC PANEL
Anion gap: 8 (ref 5–15)
BUN: 14 mg/dL (ref 6–20)
CALCIUM: 8.9 mg/dL (ref 8.9–10.3)
CO2: 28 mmol/L (ref 22–32)
Chloride: 105 mmol/L (ref 101–111)
Creatinine, Ser: 1.13 mg/dL (ref 0.61–1.24)
GFR calc Af Amer: >60 mL/min (ref >60)
GLUCOSE: 76 mg/dL (ref 65–99)
Potassium: 4.2 mmol/L (ref 3.5–5.1)
Sodium: 141 mmol/L (ref 135–145)

## 2015-12-18 LAB — BRAIN NATRIURETIC PEPTIDE: B NATRIURETIC PEPTIDE 5: 199.6 pg/mL — AB (ref 0.0–100.0)

## 2015-12-18 LAB — MAGNESIUM: MAGNESIUM: 1.7 mg/dL (ref 1.7–2.4)

## 2015-12-18 NOTE — Progress Notes (Addendum)
PROGRESS NOTE  [name] CBU:[id number] DOB: [birth year] DOA: 12/17/2015 PCP: Helen Hashimoto., MD  HPI/Recap of past 24 hours:  Feeling better  Assessment/Plan: Principal Problem:   Cellulitis of left foot Active Problems:   HYPERCHOLESTEROLEMIA   BIPOLAR DISORDER UNSPECIFIED   Essential hypertension   Coronary atherosclerosis   DEGENERATIVE DISC DISEASE, LUMBAR SPINE   Backache   Automatic implantable cardioverter-defibrillator in situ   Chronic combined systolic and diastolic congestive heart failure (HCC)   COPD (chronic obstructive pulmonary disease) (HCC)  Cellulitis of left foot: Patient has tenderness, warmth and erythema in the left foot, consistent with cellulitis. The purple discoloration of feet is likely due to bruise, rather than ischemia given good pulses in both feet. Patient is not septic. Lactate is normal. Hemodynamically stable.  - Empiric antimicrobial treatment with vancomycin and Zosyn per pharmacy - PRN Zofran for nausea, morphine and Percocet for pain - Blood cultures x 2 no growth so far, mrsa screen negative - ESR and CRP wnl - trend lactic acid levels - IVF: 1.0 L of NS bolus in ED, followed by 75 cc/h x 12hrs, no off fluids - f/u ABI result  Back pain: s/p lumbar fusion. Now presents with worsening back pain, has sciatica feature with numbness in both legs. -CT-lumbar spin without contrast (pt is allergic to iodine, cannot do MRI due to present of pacemaker) , no acute findings -Pain control with when necessary Percocet, patient constantly ask for pain meds, ? Drug seeking behavior?  HLD: Last LDL was 73 on 11/22/11 -Continue home medications: Lipitor  BIPOLAR DISORDER UNSPECIFIED: -Continue home medications: Depakote, Lexapro  Hypertension: Blood pressure 114/78 -Hold HCTZ since patient needs IV fluid due to the risk of developing sepsis -IV hydralazine when necessary  CAD: S/P of CABG. No chest pain -Continue aspirin, Lipitor,  Plavix  GERD: -Protonix  Hx of combined systolic and diastolic congestive heart failure ([place]): 2-D echo on 11/22/11 showed EF 30-35% with grade 2 diastolic dysfunction. Patient is on HCTZ. No leg edema or JVD. CHF is compensated. -Check BNP -Continue aspirin  COPD: stable. -When necessary alternate her nebulizer and DuoNeb nebulizers  Code Status: full  Family Communication: patient and Engineer, structural in the room  Disposition Plan: back to prison in 1-2 days   Consultants:  no  Procedures:  no  Antibiotics:  Vanc/zosyn from admission   Objective: BP 139/50 mmHg  Pulse 78  Temp(Src) 98.2 F (36.8 C) (Oral)  Resp 18  Ht 6' (1.829 m)  Wt 85.367 kg (188 lb 3.2 oz)  BMI 25.52 kg/m2  SpO2 100%  Intake/Output Summary (Last 24 hours) at 12/18/15 1530 Last data filed at 12/18/15 1239  Gross per 24 hour  Intake   1012 ml  Output    600 ml  Net    412 ml   Filed Weights   12/16/15 1915 12/18/15 0438  Weight: 87.091 kg (192 lb) 85.367 kg (188 lb 3.2 oz)    Exam:   General:  NAD  Cardiovascular: RRR  Respiratory: CTABL  Abdomen: Soft/ND/NT, positive BS  Musculoskeletal: 2+DP/PT pulse bilaterally which is confirmed by portable Doppler. Left foot is less swelling, less erythematous, lower left leg erythema resolved  Neuro: aox3  Data Reviewed: Basic Metabolic Panel:  Recent Labs Lab 12/17/15 0322 12/18/15 0533  NA 140 141  K 4.1 4.2  CL 106 105  CO2 23 28  GLUCOSE 84 76  BUN 15 14  CREATININE 1.21 1.13  CALCIUM 9.5 8.9  MG  --  1.7   Liver Function Tests:  Recent Labs Lab 12/17/15 0322  AST 20  ALT 15*  ALKPHOS 107  BILITOT 0.6  PROT 6.2*  ALBUMIN 3.6   No results for input(s): LIPASE, AMYLASE in the last 168 hours. No results for input(s): AMMONIA in the last 168 hours. CBC:  Recent Labs Lab 12/17/15 0322 12/18/15 0533  WBC 7.3 5.2  NEUTROABS 4.5  --   HGB 13.2 13.0  HCT 40.5 39.0  MCV 88.8 90.7  PLT 175 153   Cardiac  Enzymes:   No results for input(s): CKTOTAL, CKMB, CKMBINDEX, TROPONINI in the last 168 hours. BNP (last 3 results)  Recent Labs  12/18/15 0533  BNP 199.6*    ProBNP (last 3 results) No results for input(s): PROBNP in the last 8760 hours.  CBG: No results for input(s): GLUCAP in the last 168 hours.  Recent Results (from the past 240 hour(s))  Blood culture (routine x 2)     Status: None (Preliminary result)   Collection Time: 12/17/15  3:14 AM  Result Value Ref Range Status   Specimen Description BLOOD RIGHT HAND  Final   Special Requests BOTTLES DRAWN AEROBIC AND ANAEROBIC 5CC   Final   Culture NO GROWTH 1 DAY  Final   Report Status PENDING  Incomplete  Blood culture (routine x 2)     Status: None (Preliminary result)   Collection Time: 12/17/15  3:24 AM  Result Value Ref Range Status   Specimen Description BLOOD LEFT ARM  Final   Special Requests BOTTLES DRAWN AEROBIC AND ANAEROBIC 5ML  Final   Culture NO GROWTH 1 DAY  Final   Report Status PENDING  Incomplete  MRSA PCR Screening     Status: None   Collection Time: 12/17/15  7:39 PM  Result Value Ref Range Status   MRSA by PCR NEGATIVE NEGATIVE Final    Comment:        The GeneXpert MRSA Assay (FDA approved for NASAL specimens only), is one component of a comprehensive MRSA colonization surveillance program. It is not intended to diagnose MRSA infection nor to guide or monitor treatment for MRSA infections.      Studies: No results found.  Scheduled Meds: . aspirin  81 mg Oral Daily  . atorvastatin  80 mg Oral Daily  . clopidogrel  75 mg Oral Daily  . divalproex  500 mg Oral BID  . enoxaparin (LOVENOX) injection  40 mg Subcutaneous Daily  . escitalopram  15 mg Oral QPM  . gabapentin  600 mg Oral TID  . hydrOXYzine  25 mg Oral QPM  . ipratropium-albuterol  3 mL Nebulization BID  . loratadine  10 mg Oral Daily  . magnesium oxide  400 mg Oral Daily  . pantoprazole  40 mg Oral Daily  .  piperacillin-tazobactam (ZOSYN)  IV  3.375 g Intravenous 3 times per day  . sodium chloride flush  3 mL Intravenous Q12H  . vancomycin  1,000 mg Intravenous Q12H    Continuous Infusions:    Time spent: 2mns  Jamicah Anstead MD, PhD  [facility] Pager [phone] If 7PM-7AM, please contact night-coverage at [email or web address], password [email or web address]3/06/2016, 3:30 PM  LOS: 1 day

## 2015-12-18 NOTE — Evaluation (Signed)
Physical Therapy Evaluation Patient Details Name: [name] MRN: [id number] DOB: May 23, 1951 Today's Date: 12/18/2015   History of Present Illness   65 yo male with onset of cellulitis L foot was admitted, CT of lumbar spine shows S1 fusion screw loosened, foraminal stenosis on R  L4-5, L5-S1.  L Foot has metallic foreign bodies in soft tissues.  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  Clinical Impression  Pt was seen for evaluation of his mobility with complication of being in detention.  He needs an AD and may not be permitted to use RW there given his situation and need for safety of staff.  Will continue on with therapy and hopefully will be able to discontinue the AD, will be balanced and safe with all mobility regardless of surface or challenge.    Follow Up Recommendations No PT follow up    Equipment Recommendations  Rolling walker with 5" wheels    Recommendations for Other Services       Precautions / Restrictions Precautions Precautions: Back;Fall;ICD/Pacemaker Precaution Booklet Issued: No Precaution Comments: previous spinal fusion Restrictions Weight Bearing Restrictions: No      Mobility  Bed Mobility Overal bed mobility: Modified Independent                Transfers Overall transfer level: Modified independent Equipment used: Rolling walker (2 wheeled) (to manage the pain on L foot and weakness RLE)                Ambulation/Gait Ambulation/Gait assistance: Min guard;Min assist Ambulation Distance (Feet): 35 Feet Assistive device: Rolling walker (2 wheeled);1 person hand held assist Gait Pattern/deviations: Step-through pattern;Narrow base of support;Decreased weight shift to left;Decreased stance time - left Gait velocity: decreased Gait velocity interpretation: Below normal speed for age/gender    Stairs            Wheelchair Mobility    Modified Rankin (Stroke Patients Only)       Balance                                              Pertinent Vitals/Pain Pain Assessment: 0-10 Pain Score: 8  Pain Location: back and L foot/lower leg Pain Descriptors / Indicators: Aching;Burning Pain Intervention(s): Monitored during session;Premedicated before session    Home Living Family/patient expects to be discharged to:: Dentention/Prison                      Prior Function Level of Independence: Independent               Hand Dominance        Extremity/Trunk Assessment   Upper Extremity Assessment: Overall WFL for tasks assessed           Lower Extremity Assessment: RLE deficits/detail RLE Deficits / Details: mild strength change on RLE    Cervical / Trunk Assessment: Normal  Communication   Communication: No difficulties  Cognition Arousal/Alertness: Awake/alert Behavior During Therapy: WFL for tasks assessed/performed Overall Cognitive Status: Within Functional Limits for tasks assessed                      General Comments General comments (skin integrity, edema, etc.): Pt has erythema and brusing on L foot with edema on dorsum of foot.  Redness goes from top to lateral R foot  Exercises        Assessment/Plan    PT Assessment Patient needs continued PT services  PT Diagnosis Difficulty walking   PT Problem List Decreased strength;Decreased range of motion;Decreased balance;Decreased activity tolerance;Decreased mobility;Decreased coordination;Decreased knowledge of use of DME;Decreased safety awareness;Cardiopulmonary status limiting activity;Obesity;Decreased skin integrity;Pain  PT Treatment Interventions DME instruction;Gait training;Functional mobility training;Therapeutic activities;Therapeutic exercise;Balance training;Neuromuscular re-education;Patient/family education   PT Goals (Current goals can be found in the Care Plan section) Acute Rehab PT Goals Patient Stated Goal: feel better and walk without pain PT Goal  Formulation: With patient Time For Goal Achievement: 01/01/16 Potential to Achieve Goals: Good    Frequency Min 2X/week   Barriers to discharge Other (comment) (lives in detention)      Co-evaluation               End of Session Equipment Utilized During Treatment: Gait belt Activity Tolerance: Patient tolerated treatment well;Patient limited by pain Patient left: in bed;with call bell/phone within reach;with nursing/sitter in room;Other (comment) (has police escort) Nurse Communication: Mobility status         Time: [id number] PT Time Calculation (min) (ACUTE ONLY): 27 min   Charges:   PT Evaluation $PT Eval Low Complexity: 1 Procedure PT Treatments $Gait Training: 8-22 mins   PT G Codes:        Ivar DrapeStout, Ezrie Bunyan E 12/18/2015, 2:03 PM  Samul Dadauth Brionne Mertz, PT MS Acute Rehab Dept. Number: [facility] [id number] and [facility] [phone]

## 2015-12-19 ENCOUNTER — Inpatient Hospital Stay (HOSPITAL_COMMUNITY): Payer: Medicaid Other

## 2015-12-19 DIAGNOSIS — F319 Bipolar disorder, unspecified: Secondary | ICD-10-CM

## 2015-12-19 DIAGNOSIS — M5137 Other intervertebral disc degeneration, lumbosacral region: Secondary | ICD-10-CM

## 2015-12-19 DIAGNOSIS — I10 Essential (primary) hypertension: Secondary | ICD-10-CM

## 2015-12-19 DIAGNOSIS — I739 Peripheral vascular disease, unspecified: Secondary | ICD-10-CM

## 2015-12-19 MED ORDER — IPRATROPIUM-ALBUTEROL 0.5-2.5 (3) MG/3ML IN SOLN: 3.0000 mL | Freq: Four times a day (QID) | RESPIRATORY_TRACT | Status: DC | PRN

## 2015-12-19 MED ORDER — IBUPROFEN 800 MG PO TABS
800.0000 mg | ORAL_TABLET | Freq: Two times a day (BID) | ORAL | Status: DC | PRN
  Administered 2015-12-19: 800 mg via ORAL
  Filled 2015-12-19 (×2): qty 1

## 2015-12-19 NOTE — Progress Notes (Signed)
PROGRESS NOTE  [name] DDU:[id number] DOB: 03/08/1951 DOA: 12/17/2015 PCP: Helen Hashimoto., MD  HPI/Recap of past 24 hours:  Feeling better, but report persistent left foot pain  Assessment/Plan: Principal Problem:   Cellulitis of left foot Active Problems:   HYPERCHOLESTEROLEMIA   BIPOLAR DISORDER UNSPECIFIED   Essential hypertension   Coronary atherosclerosis   DEGENERATIVE DISC DISEASE, LUMBAR SPINE   Backache   Automatic implantable cardioverter-defibrillator in situ   Chronic combined systolic and diastolic congestive heart failure (HCC)   COPD (chronic obstructive pulmonary disease) (HCC)  Cellulitis of left foot: Patient has tenderness, warmth and erythema in the left foot, consistent with cellulitis. The purple discoloration of feet is likely due to bruise, rather than ischemia given good pulses in both feet. Patient is not septic. Lactate is normal. Hemodynamically stable.  - Empiric antimicrobial treatment with vancomycin and Zosyn per pharmacy - PRN Zofran for nausea, morphine and Percocet for pain - Blood cultures x 2 no growth so far, mrsa screen negative - ESR and CRP wnl - trend lactic acid levels - IVF: 1.0 L of NS bolus in ED, followed by 75 cc/h x 12hrs, no off fluids - f/u ABI with moderate arterial disease on the left, IR consulted, Dr Myrle Sheng consulted, will see patient on 3/11, patient is to npo after midnight for possible intervention.  Back pain: s/p lumbar fusion. Now presents with worsening back pain, has sciatica feature with numbness in both legs. -CT-lumbar spin without contrast (pt is allergic to iodine, cannot do MRI due to present of pacemaker) , no acute findings -Pain control with when necessary Percocet, patient constantly ask for pain meds, ? Drug seeking behavior?  HLD: Last LDL was 73 on 11/22/11 -Continue home medications: Lipitor  BIPOLAR DISORDER UNSPECIFIED: -Continue home medications: Depakote, Lexapro  Hypertension:  Blood pressure 114/78 -Hold HCTZ since patient needs IV fluid due to the risk of developing sepsis -IV hydralazine when necessary  CAD: S/P of CABG. No chest pain -Continue aspirin, Lipitor, Plavix  GERD: -Protonix  Hx of combined systolic and diastolic congestive heart failure ([place]): 2-D echo on 11/22/11 showed EF 30-35% with grade 2 diastolic dysfunction. Patient is on HCTZ. No leg edema or JVD. CHF is compensated. -Check BNP -Continue aspirin  COPD: stable. -When necessary alternate her nebulizer and DuoNeb nebulizers   DVT ppx: SQ Lovenox   Code Status: full  Family Communication: patient and Engineer, structural in the room  Disposition Plan: pending, eventually back to prison    Consultants:  no  Procedures:  no  Antibiotics:  Vanc/zosyn from admission   Objective: BP 165/89 mmHg  Pulse 65  Temp(Src) 97.7 F (36.5 C) (Oral)  Resp 20  Ht 6' (1.829 m)  Wt 85.276 kg (188 lb)  BMI 25.49 kg/m2  SpO2 95%  Intake/Output Summary (Last 24 hours) at 12/19/15 1622 Last data filed at 12/19/15 1010  Gross per 24 hour  Intake   2170 ml  Output   2800 ml  Net   -630 ml   Filed Weights   12/16/15 1915 12/18/15 0438 12/19/15 0533  Weight: 87.091 kg (192 lb) 85.367 kg (188 lb 3.2 oz) 85.276 kg (188 lb)    Exam:   General:  NAD  Cardiovascular: RRR  Respiratory: CTABL  Abdomen: Soft/ND/NT, positive BS  Musculoskeletal: 2+DP/PT pulse bilaterally which is confirmed by portable Doppler. Left foot is less swelling, less erythematous, lower left leg erythema resolved  Neuro: aox3  Data Reviewed: Basic Metabolic Panel:  Recent Labs Lab 12/17/15 0322 12/18/15 0533  NA 140 141  K 4.1 4.2  CL 106 105  CO2 23 28  GLUCOSE 84 76  BUN 15 14  CREATININE 1.21 1.13  CALCIUM 9.5 8.9  MG  --  1.7   Liver Function Tests:  Recent Labs Lab 12/17/15 0322  AST 20  ALT 15*  ALKPHOS 107  BILITOT 0.6  PROT 6.2*  ALBUMIN 3.6   No results for input(s):  LIPASE, AMYLASE in the last 168 hours. No results for input(s): AMMONIA in the last 168 hours. CBC:  Recent Labs Lab 12/17/15 0322 12/18/15 0533  WBC 7.3 5.2  NEUTROABS 4.5  --   HGB 13.2 13.0  HCT 40.5 39.0  MCV 88.8 90.7  PLT 175 153   Cardiac Enzymes:   No results for input(s): CKTOTAL, CKMB, CKMBINDEX, TROPONINI in the last 168 hours. BNP (last 3 results)  Recent Labs  12/18/15 0533  BNP 199.6*    ProBNP (last 3 results) No results for input(s): PROBNP in the last 8760 hours.  CBG: No results for input(s): GLUCAP in the last 168 hours.  Recent Results (from the past 240 hour(s))  Blood culture (routine x 2)     Status: None (Preliminary result)   Collection Time: 12/17/15  3:14 AM  Result Value Ref Range Status   Specimen Description BLOOD RIGHT HAND  Final   Special Requests BOTTLES DRAWN AEROBIC AND ANAEROBIC 5CC   Final   Culture NO GROWTH 2 DAYS  Final   Report Status PENDING  Incomplete  Blood culture (routine x 2)     Status: None (Preliminary result)   Collection Time: 12/17/15  3:24 AM  Result Value Ref Range Status   Specimen Description BLOOD LEFT ARM  Final   Special Requests BOTTLES DRAWN AEROBIC AND ANAEROBIC 5ML  Final   Culture NO GROWTH 2 DAYS  Final   Report Status PENDING  Incomplete  MRSA PCR Screening     Status: None   Collection Time: 12/17/15  7:39 PM  Result Value Ref Range Status   MRSA by PCR NEGATIVE NEGATIVE Final    Comment:        The GeneXpert MRSA Assay (FDA approved for NASAL specimens only), is one component of a comprehensive MRSA colonization surveillance program. It is not intended to diagnose MRSA infection nor to guide or monitor treatment for MRSA infections.      Studies: No results found.  Scheduled Meds: . aspirin  81 mg Oral Daily  . atorvastatin  80 mg Oral Daily  . clopidogrel  75 mg Oral Daily  . divalproex  500 mg Oral BID  . enoxaparin (LOVENOX) injection  40 mg Subcutaneous Daily  .  escitalopram  15 mg Oral QPM  . gabapentin  600 mg Oral TID  . hydrOXYzine  25 mg Oral QPM  . loratadine  10 mg Oral Daily  . magnesium oxide  400 mg Oral Daily  . pantoprazole  40 mg Oral Daily  . piperacillin-tazobactam (ZOSYN)  IV  3.375 g Intravenous 3 times per day  . sodium chloride flush  3 mL Intravenous Q12H  . vancomycin  1,000 mg Intravenous Q12H    Continuous Infusions:    Time spent: 33mns  Cathline Dowen MD, PhD  [facility] Pager [phone] If 7PM-7AM, please contact night-coverage at [email or web address], password [email or web address]3/07/2016, 4:22 PM  LOS: 2 days

## 2015-12-20 DIAGNOSIS — J438 Other emphysema: Secondary | ICD-10-CM

## 2015-12-20 LAB — BASIC METABOLIC PANEL
ANION GAP: 10 (ref 5–15)
BUN: 13 mg/dL (ref 6–20)
CALCIUM: 9.2 mg/dL (ref 8.9–10.3)
CO2: 26 mmol/L (ref 22–32)
CREATININE: 1.09 mg/dL (ref 0.61–1.24)
Chloride: 103 mmol/L (ref 101–111)
Glucose, Bld: 85 mg/dL (ref 65–99)
Potassium: 4.3 mmol/L (ref 3.5–5.1)
SODIUM: 139 mmol/L (ref 135–145)

## 2015-12-20 LAB — CBC
HCT: 40.7 % (ref 39.0–52.0)
Hemoglobin: 13.2 g/dL (ref 13.0–17.0)
MCH: 28.6 pg (ref 26.0–34.0)
MCHC: 32.4 g/dL (ref 30.0–36.0)
MCV: 88.1 fL (ref 78.0–100.0)
PLATELETS: 177 10*3/uL (ref 150–400)
RBC: 4.62 MIL/uL (ref 4.22–5.81)
RDW: 12.6 % (ref 11.5–15.5)
WBC: 4.4 10*3/uL (ref 4.0–10.5)

## 2015-12-20 LAB — MAGNESIUM: MAGNESIUM: 1.8 mg/dL (ref 1.7–2.4)

## 2015-12-20 MED ORDER — PREDNISONE 50 MG PO TABS
50.0000 mg | ORAL_TABLET | Freq: Four times a day (QID) | ORAL | Status: AC
  Administered 2015-12-20 (×3): 50 mg via ORAL
  Filled 2015-12-20 (×3): qty 1

## 2015-12-20 MED ORDER — DIPHENHYDRAMINE HCL 25 MG PO CAPS
50.0000 mg | ORAL_CAPSULE | Freq: Once | ORAL | Status: AC
  Administered 2015-12-20: 50 mg via ORAL
  Filled 2015-12-20: qty 2

## 2015-12-20 MED ORDER — HYDROCHLOROTHIAZIDE 25 MG PO TABS
25.0000 mg | ORAL_TABLET | Freq: Every day | ORAL | Status: DC
  Administered 2015-12-20 – 2015-12-21 (×2): 25 mg via ORAL
  Filled 2015-12-20 (×2): qty 1

## 2015-12-20 NOTE — Consult Note (Addendum)
Chief Complaint: Patient was seen in consultation today for  Chief Complaint  Patient presents with  . Foot Swelling   at the request of Dr. Albertine Grates.  Referring Physician(s): Albertine Grates   History of Present Illness: [name] is a 65 y.o. male currently admitted for left foot cellulitis. Vascular and Interventional Radiology is consulted to evaluate for underlying arterial insufficiency at the kind request of Dr. Parke Poisson.  [name] is a 65 year old male with a past medical history significant for hypertension, hyperlipidemia, COPD, significant former smoking history, coronary artery disease status post four-vessel CABG, congestive heart failure, abdominal aortic aneurysm and stroke who was in his usual state of health until he began to develop spontaneous left foot pain and swelling approximately 5 weeks ago. This slowly progressed over time and he developed a welts and bruising approximately 1 week ago. He is currently incarcerated and was brought to the hospital by the corrections officers for further evaluation. He denies any known injury or trauma to the foot. He denies symptoms of claudication although he does have constant radicular pain in both legs which is worse when he lays down. He has a significant past history of degenerative disc disease having undergone multiple prior spine surgeries.  He was diagnosed with foot cellulitis and begun on broad-spectrum intravenous antibiotics. Blood cultures have been negative to date. There are no signs of sepsis. Ultrasound interrogation of the soft tissues of the foot demonstrates edema but no focal abscess or evidence of tenosynovitis.  A noninvasive duplex arterial study was performed demonstrating moderate peripheral arterial disease in the affected left lower extremity. He is a former smoker and smoked a pack of cigarettes per day for approximately 40 years. He has not smoked in the last 5 years.   Past Medical History  Diagnosis  Date  . Coronary artery disease     bypass grafting. Left internal mammary artery to left anterior descending   . Hypertension   . Hypercholesteremia   . Periodic limb movement disorder   . Hypersomnia, unspecified   . Syncope and collapse   . Tobacco use disorder   . Degeneration of lumbar or lumbosacral intervertebral disc   . Personal history of other diseases of digestive system   . Cocaine abuse     hx of   . Alcohol abuse     hx of  . Back pain, chronic   . Bipolar disorder, unspecified (HCC)   . Aortocoronary bypass status 1996    surgery  . Defibrillator activation   . History of ventricular tachycardia      status post automatic implantable cardioverter defibrillator/pacer  . Stroke [facility])     Subacute right pontine stroke due to lacune and small-vessel disease   . AAA (abdominal aortic aneurysm) (HCC)   . History of gastrointestinal bleeding   . Myocardial infarction (HCC)     hx of; sees [place]  . MI (myocardial infarction) [facility]) 1989 & 2006    with LV dysfunction  . Pacemaker   . Heart murmur   . COPD (chronic obstructive pulmonary disease) (HCC)   . Shortness of breath   . GERD (gastroesophageal reflux disease)   . Combined systolic and diastolic congestive heart failure (HCC)   . Chronic combined systolic and diastolic congestive heart failure (HCC)   . AICD (automatic cardioverter/defibrillator) present   . Cellulitis of left foot hospitalized 12/17/2015    Past Surgical History  Procedure Laterality Date  . Back surgery    .  Cardiac defibrillator placement  10/23/2007     Medtronic Maximo single chamber cardioverter-defibrillator  . Coronary artery bypass graft  2007    in [place], [place] -- LIMA to LAD, SVG to diagonal, SVG to RCA, SVG to circumflex (saphenous vein graft to circumflex totaled at catheterization in 2006)  . Cardiac catheterization  05/14/2006    Est EF of over 50%  . Cardiac catheterization      Est EF of 50% --   . Cardiac  catheterization  09/03/2008    Continued patency of the internal mammary to the LAD -- of the saphenous vein graft to the diagonal -- of the saphenous vein graft to PDA -- Occluded saphenous vein graft to the circumflex with essentially widely patent circumflex vessel  . Lumbar fusion      L5-S1 transforaminal lumbar interbody fusion with contralateral posterolateral fusion at the same level -- Posterolateral fusion T10-11, T11-12, T12-L1, L1-L2, L2-L3, L3-L4, L4-L5 --   . Lumbar laminectomy/decompression microdiscectomy  11/26/2010    Laminectomy with decompression of the spinal cord C3-4, C4-5, C5-6,C6-7  . Cervical fusion    . Bi-ventricular implantable cardioverter defibrillator upgrade  ~ 10/2015    "[facility]; its a pacemaker & defibrillator"    Allergies: Amitriptyline hcl; Codeine; Iohexol; Ivp dye; and Nifedipine  Medications: Prior to Admission medications   Medication Sig Start Date End Date Taking? Authorizing Provider  albuterol (PROVENTIL) (2.5 MG/3ML) 0.083% nebulizer solution Take 2.5 mg by nebulization 2 (two) times daily as needed for wheezing or shortness of breath. For shortness of breath.   Yes Historical Provider, MD  aspirin 81 MG chewable tablet Chew 81 mg by mouth daily.   Yes Historical Provider, MD  atorvastatin (LIPITOR) 80 MG tablet Take 80 mg by mouth daily.   Yes Historical Provider, MD  clopidogrel (PLAVIX) 75 MG tablet Take 75 mg by mouth daily.   Yes Historical Provider, MD  divalproex (DEPAKOTE) 500 MG DR tablet Take 500 mg by mouth 2 (two) times daily.   Yes Historical Provider, MD  escitalopram (LEXAPRO) 5 MG tablet Take 15 mg by mouth every evening.   Yes Historical Provider, MD  gabapentin (NEURONTIN) 600 MG tablet Take 600 mg by mouth 3 (three) times daily.   Yes Historical Provider, MD  hydrochlorothiazide (HYDRODIURIL) 25 MG tablet Take 25 mg by mouth daily.   Yes Historical Provider, MD  hydrOXYzine (VISTARIL) 25 MG capsule Take 25 mg by mouth  every evening.   Yes Historical Provider, MD  ipratropium-albuterol (DUONEB) 0.5-2.5 (3) MG/3ML SOLN Take 3 mLs by nebulization 2 (two) times daily as needed (shortness of breath).   Yes Historical Provider, MD  loratadine (CLARITIN) 10 MG tablet Take 10 mg by mouth daily.   Yes Historical Provider, MD  magnesium oxide (MAG-OX) 400 (241.3 Mg) MG tablet Take 400 mg by mouth daily.   Yes Historical Provider, MD  meclizine (ANTIVERT) 25 MG tablet Take 25 mg by mouth 3 (three) times daily as needed for dizziness.   Yes Historical Provider, MD  pantoprazole (PROTONIX) 40 MG tablet Take 40 mg by mouth daily.   Yes Historical Provider, MD  traMADol (ULTRAM) 50 MG tablet Take 50 mg by mouth 3 (three) times daily as needed for moderate pain.   Yes Historical Provider, MD     Family History  Problem Relation Age of Onset  . Heart disease Mother 40    deceased  . Heart disease Father 43    deceased  . Heart disease  Brother     alive  . Heart attack Mother   . Heart attack Father   . GI problems Sister     bleed    Social History   Social History  . Marital Status: Divorced    Spouse Name: N/A  . Number of Children: N/A  . Years of Education: N/A   Social History Main Topics  . Smoking status: Former Smoker -- 1.00 packs/day for 40 years    Types: Cigarettes  . Smokeless tobacco: Never Used     Comment: "quit smoking in 2012"  . Alcohol Use: Yes     Comment: "quit drinking <~2012"  . Drug Use: Yes    Special: Cocaine     Comment: He last tested positive for THC in 10-2007. Last tested positive for cocaine in 2007  . Sexual Activity: No   Other Topics Concern  . None   Social History Narrative    Review of Systems: A 12 point ROS discussed and pertinent positives are indicated in the HPI above.  All other systems are negative.  Review of Systems  Vital Signs: BP 157/79 mmHg  Pulse 69  Temp(Src) 98.4 F (36.9 C) (Oral)  Resp 19  Ht 6' (1.829 m)  Wt 188 lb (85.276 kg)   BMI 25.49 kg/m2  SpO2 97%  Physical Exam  HENT:  Head: Normocephalic and atraumatic.  Eyes: No scleral icterus.  Cardiovascular: Normal rate and regular rhythm.   Pulses:      Femoral pulses are 2+ on the right side, and 1+ on the left side.      Popliteal pulses are 1+ on the right side, and 1+ on the left side.       Dorsalis pedis pulses are 0 on the right side, and 0 on the left side.       Posterior tibial pulses are 0 on the right side, and 0 on the left side.  Distal pulses not palpable but edema may be confounding.     Asymmetric femoral pulses slightly decreased on the left.   Pulmonary/Chest: Effort normal.  Abdominal: Soft. He exhibits no distension. There is no tenderness.  Feet:  Right Foot:  Skin Integrity: Positive for callus.  Left Foot:  Skin Integrity: Positive for erythema, warmth and callus. Negative for ulcer, blister or skin breakdown.  Neurological: He is alert.  Psychiatric: He has a normal mood and affect. His behavior is normal.  Nursing note and vitals reviewed.   Mallampati Score:     Imaging: Ct Lumbar Spine Wo Contrast  12/17/2015  CLINICAL DATA:  Low back pain. Prior spinal fusion. Left foot swelling for 5 weeks. Bilateral foot numbness for 1 week. EXAM: CT LUMBAR SPINE WITHOUT CONTRAST TECHNIQUE: Multidetector CT imaging of the lumbar spine was performed without intravenous contrast administration. Multiplanar CT image reconstructions were also generated. COMPARISON:  Lumbar spine radiographs 10/29/2014 and lumbar spine CT 01/21/2012 FINDINGS: The spine is imaged from the mid T12 to S2-3 level. Sequelae of prior posterior spinal fusion are again identified. The superior extent of the fusion was not imaged. The right L2 screw is again noted to traverse the lateral recess. Prominent lucency is again seen about both S1 screws consistent with loosening. Chronic L5-S1 pseudarthrosis and vacuum disc phenomenon are noted with slightly increased subsidence of  the interbody spacer into the S1 superior endplate. There is new auto fusion across the L1-2 and L4-5 disc spaces. There are also now solid posterolateral fusion masses bilaterally from T12-L5.  Mild thoracolumbar dextroscoliosis is partially visualized. There is no lumbar listhesis. Vertebral body heights are preserved without evidence of compression fracture. An L3 superior endplate Schmorl's node is again seen. Sclerotic and cystic endplate changes are present L5-S1. Age advanced atherosclerotic calcification is noted of the abdominal aorta and its major branch vessels. T12-L1:  Endplate spurring without significant stenosis, unchanged. L1-2: Prior posterior decompression and fusion. Mild left osseous neural foraminal stenosis is unchanged. Spinal canal is patent. L2-3: Prior posterior decompression and fusion. Mild annular bulging and calcification without stenosis. L3-4: Prior posterior decompression and fusion. Mild annular bulging and calcification without significant stenosis. L4-5: Prior posterior decompression and fusion. Mild-to-moderate osseous neural foraminal narrowing on the right is similar to the prior study. Spinal canal is widely patent. L5-S1: Prior posterior decompression and fusion. Spinal canal is widely patent. There is likely mild to moderate right neural foraminal stenosis due to disc bulging and endplate spurring which may have mildly increased. Left neural foramen appears adequately patent. IMPRESSION: 1. Prior posterior spinal fusion with chronic L5-S1 pseudarthrosis and chronic S1 screw loosening. Solid posterolateral fusion masses. 2. New fusion across the L1-2 and L4-5 disc spaces. 3. Mild-to-moderate right neural foraminal stenosis at L4-5 and L5-S1, possibly increased at the latter. Electronically Signed   By: Sebastian AcheAllen  Grady M.D.   On: 12/17/2015 08:09   [place] Extrem Low Left Ltd  12/19/2015  CLINICAL DATA:  Evaluate for abscess. EXAM: ULTRASOUND left LOWER EXTREMITY LIMITED TECHNIQUE:  Ultrasound examination of the lower extremity soft tissues was performed in the area of clinical concern. COMPARISON:  Radiography 2 days ago FINDINGS: Dorsal forefoot (approximately at the MTP level) subcutaneous tissue expansion with ill-defined tracking fluid. No mature or or discrete abscess. No fluid tracking along the visible tendons to suggest tenosynovitis. The metallic foreign body seen on previous radiography was not visualized. IMPRESSION: Dorsal forefoot cellulitis and phlegmon without drainable collection. Electronically Signed   By: Marnee SpringJonathon  Watts M.D.   On: 12/19/2015 22:24   Dg Foot Complete Left  12/17/2015  CLINICAL DATA:  Left foot swelling and discoloration for 5 weeks. EXAM: LEFT FOOT - COMPLETE 3+ VIEW COMPARISON:  None. FINDINGS: Small linear metallic foreign body demonstrated in the soft tissues over the plantar aspect of the proximal left second toe. No evidence of acute fracture or dislocation. No focal bone lesion or bone destruction. Bone cortex appears intact. Surgical clips in the left lower leg. IMPRESSION: Linear metallic foreign body demonstrated in the soft tissues over the plantar aspect of the proximal second left toe. No acute bony abnormalities. Electronically Signed   By: Burman NievesWilliam  Stevens M.D.   On: 12/17/2015 04:45   Dg Foot Complete Right  12/17/2015  CLINICAL DATA:  Right foot purple discoloration in numbness to the feet for 1 week. EXAM: RIGHT FOOT COMPLETE - 3+ VIEW COMPARISON:  None. FINDINGS: There is no evidence of fracture or dislocation. There is no evidence of arthropathy or other focal bone abnormality. Soft tissues are unremarkable. IMPRESSION: Negative. Electronically Signed   By: Burman NievesWilliam  Stevens M.D.   On: 12/17/2015 04:46    Labs:  CBC:  Recent Labs  12/17/15 0322 12/18/15 0533 12/20/15 0542  WBC 7.3 5.2 4.4  HGB 13.2 13.0 13.2  HCT 40.5 39.0 40.7  PLT 175 153 177    COAGS:  Recent Labs  12/17/15 0557  INR 1.13  APTT 32     BMP:  Recent Labs  12/17/15 0322 12/18/15 0533 12/20/15 0542  NA 140 141 139  K 4.1 4.2 4.3  CL 106 105 103  CO2 GLUCOSE 84 76 85  BUN CALCIUM 9.5 8.9 9.2  CREATININE 1.21 1.13 1.09  GFRNONAA >60 >60 >60  GFRAA >60 >60 >60    LIVER FUNCTION TESTS:  Recent Labs  12/17/15 0322  BILITOT 0.6  AST 20  ALT 15*  ALKPHOS 107  PROT 6.2*  ALBUMIN 3.6    TUMOR MARKERS: No results for input(s): AFPTM, CEA, CA199, CHROMGRNA in the last 8760 hours.  Assessment and Plan:  65 year old male with multiple risk factors for peripheral arterial disease including male sex, age, significant for smoking history, hypertension and hyperlipidemia.   He has active cellulitis of the dorsal aspect of the left foot in the distribution of the anterior tibial artery but no skin breakdown, ulceration or wound. Based on his noninvasive vascular study and physical exam findings, I do not suspect that he has critical limb ischemia. I think he probably has mild to moderate peripheral arterial disease on the left, likely secondary to inflow or outflow disease.   We will hold off on an arteriogram for now. Instead, I will order a CTA runoff to get a better picture of his overall vascular anatomy.    1.) Continue antibiotics 2.) Resume diet 3.) CTA runoff, pt will require pre-medication for contrast allergy   Thank you for this interesting consult.  I greatly enjoyed meeting [name] and look forward to participating in their care.  A copy of this report was sent to the requesting provider on this date.  Electronically Signed: Malachy Moan 12/20/2015, 8:20 AM   I spent a total of 40 Minutes in face to face in clinical consultation, greater than 50% of which was counseling/coordinating care for LEFT foot cellulitis, PAD.

## 2015-12-20 NOTE — Progress Notes (Signed)
PROGRESS NOTE  [name] AOZ:[id number] DOB: [birth year] DOA: 12/17/2015 PCP: Helen Hashimoto., MD  HPI/Recap of past 24 hours:  Left foot continue to improve by visual examination,  but patient report persistent left foot pain  Assessment/Plan: Principal Problem:   Cellulitis of left foot Active Problems:   HYPERCHOLESTEROLEMIA   BIPOLAR DISORDER UNSPECIFIED   Essential hypertension   Coronary atherosclerosis   DEGENERATIVE DISC DISEASE, LUMBAR SPINE   Backache   Automatic implantable cardioverter-defibrillator in situ   Chronic combined systolic and diastolic congestive heart failure (HCC)   COPD (chronic obstructive pulmonary disease) (HCC)  Cellulitis of left foot: Patient has tenderness, warmth and erythema in the left dorsal foot and erythema extended to lower leg, consistent with cellulitis. The purple discoloration of feet is likely due to bruise, rather than ischemia given good pulses in both feet. Patient is not septic. Lactate is normal. Hemodynamically stable.  - Empiric antimicrobial treatment with vancomycin and Zosyn per pharmacy - Blood cultures x 2 no growth so far, mrsa screen negative - ESR,CRP and lactic acid wnl - IVF: 1.0 L of NS bolus in ED, followed by 75 cc/h x 12hrs, no off fluids -better , left foot ultrasound, +phelgmon, no drainable abscess - f/u ABI with moderate arterial disease on the left, IR consulted, Dr Myrle Sheng input appreciated, now on prednisone in prepare for CTA leg, (h/o dye allergy)  Back pain: s/p lumbar fusion. Now presents with worsening back pain, has sciatica feature with numbness in both legs. -CT-lumbar spin without contrast (pt is allergic to iodine, cannot do MRI due to present of pacemaker) , no acute findings -Pain control with when necessary Percocet, patient constantly ask for pain meds, ? Drug seeking behavior? Avoid excessive narcotics.   HLD: Last LDL was 73 on 11/22/11 -Continue home medications:  Lipitor  BIPOLAR DISORDER UNSPECIFIED: -Continue home medications: Depakote, Lexapro  Hypertension: Blood pressure 114/78 -Hold HCTZ since patient needs IV fluid due to the risk of developing sepsis -IV hydralazine when necessary -bp improved , restart hctz.  CAD: S/P of CABG. No chest pain -Continue aspirin, Lipitor, Plavix, per chart review patient was on lopressor/lisinopril in 2013, but these meds are not on his home meds this time  GERD: -Protonix  Hx of combined systolic and diastolic congestive heart failure ([place]): 2-D echo on 11/22/11 showed EF 30-35% with grade 2 diastolic dysfunction. Patient is on HCTZ. No leg edema or JVD. CHF is compensated. -BNP 199 -Continue aspirin, hctz only, (per chart review patient was on lopressor/lisinopril in 2013, but these meds are not on his home meds this time) ,will repeat echo, adjust meds according to echo result.   COPD: stable. -When necessary alternate her nebulizer and DuoNeb nebulizers   DVT ppx: SQ Lovenox   Code Status: full  Family Communication: patient and police officer in the room  Disposition Plan: pending, eventually back to prison    Consultants:  Interventional radiology Dr Myrle Sheng.  Procedures:  CTA left leg  Antibiotics:  Vanc/zosyn from admission   Objective: BP 170/90 mmHg  Pulse 74  Temp(Src) 97.7 F (36.5 C) (Oral)  Resp 18  Ht 6' (1.829 m)  Wt 86.274 kg (190 lb 3.2 oz)  BMI 25.79 kg/m2  SpO2 98%  Intake/Output Summary (Last 24 hours) at 12/20/15 1901 Last data filed at 12/20/15 0900  Gross per 24 hour  Intake    890 ml  Output      0 ml  Net    890 ml  Filed Weights   12/18/15 0438 Jan 10, 2016 0533 12/20/15 0821  Weight: 85.367 kg (188 lb 3.2 oz) 85.276 kg (188 lb) 86.274 kg (190 lb 3.2 oz)    Exam:   General:  NAD  Cardiovascular: RRR  Respiratory: CTABL  Abdomen: Soft/ND/NT, positive BS  Musculoskeletal: 2+DP/PT pulse bilaterally which is confirmed by portable Doppler.  Left foot is less swelling, less erythematous, lower left leg erythema resolved  Neuro: aox3  Data Reviewed: Basic Metabolic Panel:  Recent Labs Lab 12/17/15 0322 12/18/15 0533 12/20/15 0542  NA 140 141 139  K 4.1 4.2 4.3  CL 106 105 103  CO2 '23 28 26  ' GLUCOSE 84 76 85  BUN '15 14 13  ' CREATININE 1.21 1.13 1.09  CALCIUM 9.5 8.9 9.2  MG  --  1.7 1.8   Liver Function Tests:  Recent Labs Lab 12/17/15 0322  AST 20  ALT 15*  ALKPHOS 107  BILITOT 0.6  PROT 6.2*  ALBUMIN 3.6   No results for input(s): LIPASE, AMYLASE in the last 168 hours. No results for input(s): AMMONIA in the last 168 hours. CBC:  Recent Labs Lab 12/17/15 0322 12/18/15 0533 12/20/15 0542  WBC 7.3 5.2 4.4  NEUTROABS 4.5  --   --   HGB 13.2 13.0 13.2  HCT 40.5 39.0 40.7  MCV 88.8 90.7 88.1  PLT 175 153 177   Cardiac Enzymes:   No results for input(s): CKTOTAL, CKMB, CKMBINDEX, TROPONINI in the last 168 hours. BNP (last 3 results)  Recent Labs  12/18/15 0533  BNP 199.6*    ProBNP (last 3 results) No results for input(s): PROBNP in the last 8760 hours.  CBG: No results for input(s): GLUCAP in the last 168 hours.  Recent Results (from the past 240 hour(s))  Blood culture (routine x 2)     Status: None (Preliminary result)   Collection Time: 12/17/15  3:14 AM  Result Value Ref Range Status   Specimen Description BLOOD RIGHT HAND  Final   Special Requests BOTTLES DRAWN AEROBIC AND ANAEROBIC 5CC   Final   Culture NO GROWTH 3 DAYS  Final   Report Status PENDING  Incomplete  Blood culture (routine x 2)     Status: None (Preliminary result)   Collection Time: 12/17/15  3:24 AM  Result Value Ref Range Status   Specimen Description BLOOD LEFT ARM  Final   Special Requests BOTTLES DRAWN AEROBIC AND ANAEROBIC 5ML  Final   Culture NO GROWTH 3 DAYS  Final   Report Status PENDING  Incomplete  MRSA PCR Screening     Status: None   Collection Time: 12/17/15  7:39 PM  Result Value Ref Range  Status   MRSA by PCR NEGATIVE NEGATIVE Final    Comment:        The GeneXpert MRSA Assay (FDA approved for NASAL specimens only), is one component of a comprehensive MRSA colonization surveillance program. It is not intended to diagnose MRSA infection nor to guide or monitor treatment for MRSA infections.      Studies: [place] Extrem Low Left Ltd  01/10/16  CLINICAL DATA:  Evaluate for abscess. EXAM: ULTRASOUND left LOWER EXTREMITY LIMITED TECHNIQUE: Ultrasound examination of the lower extremity soft tissues was performed in the area of clinical concern. COMPARISON:  Radiography 2 days ago FINDINGS: Dorsal forefoot (approximately at the MTP level) subcutaneous tissue expansion with ill-defined tracking fluid. No mature or or discrete abscess. No fluid tracking along the visible tendons to suggest tenosynovitis. The metallic foreign body  seen on previous radiography was not visualized. IMPRESSION: Dorsal forefoot cellulitis and phlegmon without drainable collection. Electronically Signed   By: Monte Fantasia M.D.   On: 12/19/2015 22:24    Scheduled Meds: . aspirin  81 mg Oral Daily  . atorvastatin  80 mg Oral Daily  . clopidogrel  75 mg Oral Daily  . divalproex  500 mg Oral BID  . enoxaparin (LOVENOX) injection  40 mg Subcutaneous Daily  . escitalopram  15 mg Oral QPM  . gabapentin  600 mg Oral TID  . hydrOXYzine  25 mg Oral QPM  . loratadine  10 mg Oral Daily  . magnesium oxide  400 mg Oral Daily  . pantoprazole  40 mg Oral Daily  . piperacillin-tazobactam (ZOSYN)  IV  3.375 g Intravenous 3 times per day  . predniSONE  50 mg Oral Q6H  . sodium chloride flush  3 mL Intravenous Q12H  . vancomycin  1,000 mg Intravenous Q12H    Continuous Infusions:    Time spent: 33mns  Melana Hingle MD, PhD  [facility] Pager [phone] If 7PM-7AM, please contact night-coverage at [email or web address], password [email or web address]3/08/2016, 7:01 PM  LOS: 3 days

## 2015-12-20 NOTE — Progress Notes (Signed)
Pharmacy Antibiotic Note  [name] is a 65 y.o. male admitted on 12/17/2015 with cellulitis.  Pharmacy has been consulted for vancomycin/Zosyn dosing.  Plan: -Zosyn 3.375gm IV q8h EI -Vancomycin 1gm IV q12h -f/u LOT. MRSA PCR neg so would consider de-escalation.  Height: 6' (182.9 cm) Weight: 190 lb 3.2 oz (86.274 kg) (scale c) IBW/kg (Calculated) : 77.6  Temp (24hrs), Avg:98.1 F (36.7 C), Min:97.8 F (36.6 C), Max:98.4 F (36.9 C)   Recent Labs Lab 12/17/15 0322 12/17/15 0353 12/17/15 0609 12/18/15 0533 12/20/15 0542  WBC 7.3  --   --  5.2 4.4  CREATININE 1.21  --   --  1.13 1.09  LATICACIDVEN  --  0.85 0.52  --   --     Estimated Creatinine Clearance: 77.1 mL/min (by C-G formula based on Cr of 1.09).    Allergies  Allergen Reactions  . Amitriptyline Hcl     unknown  . Codeine     unknown  . Iohexol      Desc: PT STATES THAT HE HAS CONVULSIONS/SEIZURES S/P CONTRAST.  PT WAS GIVEN 1 HR PREMEDS AND HAD NO PROBLEMS.  STEPHANIE DAVIS RT-RCT, Onset Date: [id number]   . Ivp Dye [Iodinated Diagnostic Agents]     unknown  . Nifedipine     unknown    Antimicrobials this admission: Vanc 3/8>>  zosyn 3/8>>   Microbiology results: 3/8 BCx: ngtd 3/8 MRSA PCR: neg  Thank you for allowing pharmacy to be a part of this patient's care.  Sandi CarneNick Ethanjames Fontenot, PharmD Pharmacy Resident Pager: [phone] 12/20/2015 10:55 AM

## 2015-12-21 ENCOUNTER — Encounter (HOSPITAL_COMMUNITY): Payer: Self-pay | Admitting: Radiology

## 2015-12-21 ENCOUNTER — Inpatient Hospital Stay (HOSPITAL_COMMUNITY): Payer: Medicaid Other

## 2015-12-21 DIAGNOSIS — Z951 Presence of aortocoronary bypass graft: Secondary | ICD-10-CM

## 2015-12-21 MED ORDER — DOXYCYCLINE HYCLATE 100 MG PO CAPS: 100.0000 mg | ORAL_CAPSULE | Freq: Two times a day (BID) | ORAL | Status: AC

## 2015-12-21 MED ORDER — OXYCODONE-ACETAMINOPHEN 5-325 MG PO TABS: 1.0000 | ORAL_TABLET | Freq: Four times a day (QID) | ORAL | Status: AC | PRN

## 2015-12-21 MED ORDER — CARVEDILOL 3.125 MG PO TABS
3.1250 mg | ORAL_TABLET | Freq: Two times a day (BID) | ORAL | Status: AC
Start: 2015-12-21 — End: ?

## 2015-12-21 MED ORDER — LISINOPRIL 10 MG PO TABS: 20.0000 mg | ORAL_TABLET | Freq: Every day | ORAL | Status: AC

## 2015-12-21 MED ORDER — IOHEXOL 350 MG/ML SOLN
100.0000 mL | Freq: Once | INTRAVENOUS | Status: AC | PRN
  Administered 2015-12-21: 100 mL via INTRAVENOUS

## 2015-12-21 NOTE — Discharge Summary (Signed)
[facility], discharge instructions provided and patient showed understanding to it, IV taken out,Telemonitor DC,pt left unit in wheelchair with all of the belongings accompanied with a Emergency planning/management officerolice officer.

## 2015-12-21 NOTE — Discharge Summary (Addendum)
Discharge Summary  [name] SWN:[id number] DOB: 10/29/1950  PCP: Helen Hashimoto., MD  Admit date: 12/17/2015 Discharge date: 12/21/2015  Time spent: >73mns  Recommendations for Outpatient Follow-up:  1. F/u with PMD within a week  for hospital discharge follow up, repeat cbc/bmp at follow up, assess resolution of left foot cellulitis. 2. Need to repeat CT chest to follow up on right lower lobe lung nodule. 3. Refer to orthopedic surgery for loose screw on thoracic spine ( see CT report below) 4. Consider echocardiogram to evaluate lvef, and continue to titrate heart medicine    Discharge Diagnoses:  Active Hospital Problems   Diagnosis Date Noted  . Cellulitis of left foot 12/17/2015  . COPD (chronic obstructive pulmonary disease) ([facility]   . Chronic combined systolic and diastolic congestive heart failure ([facility]   . Automatic implantable cardioverter-defibrillator in situ 06/27/2012  . HYPERCHOLESTEROLEMIA 03/06/2009  . BIPOLAR DISORDER UNSPECIFIED 03/06/2009  . Backache 03/06/2009  . Essential hypertension 06/08/2007  . Coronary atherosclerosis 06/08/2007  . [place] L[place]08[place]2007    Resolved Hospital Problems   Diagnosis Date Noted Date Resolved  No resolved problems to display.    Discharge Condition: stable  Diet recommendation: heart healthy  Filed Weights   12/18/15 0438 12/19/15 0533 12/20/15 0821  Weight: 85.367 kg (188 lb 3.2 oz) 85.276 kg (188 lb) 86.274 kg (190 lb 3.2 oz)    History of present illness:  POHM DENTLERis a 65y.o. male with PMH of hypertension, hyperlipidemia, COPD, GERD, ICD pacemaker, CAD, S/P CABG, former cocaine abuser, former alcohol abuser, former alcohol abuser, bipolar disorder, AAA, stroke (affectted left side with complete resolution of symptom), combined systolic and diastolic congestive heart failure, who presents with left foot pain and lower back pain.  Pt is from jail and accompanied  by policeman. Pt reports that he has left foot pain and swelling for about 5 weeks. It has been progressively getting worse. His left foot become erythematous. Pt states that he started getting a purple colors to the toes for 1 week. Pt also has purple discoloration to the side of the right foot, but no right foot pain or swelling. He denies any injury to his feet. He also states that he has worsening lower back pain, which is radiating down to the posterior legs bilaterally, causing numbness in lower legs and feet. No leg weakness. No urinary incontinence or loss of control bowel movement. Patient does not have fever, chills, chest pain, cough, shortness of breath, abdominal pain, nausea, vomiting, diarrhea, symptoms of UTI or unilateral weakness.  In ED, patient was found to have lactate 0.85, WBC 7.3, temperature normal, no tachycardia, electrolytes okay, creatinine 1.21. X-ray of her right foot is negative for acute abnormalities. X-ray of left foot showed linear metallic foreign bodyin the soft tissues over the plantar aspect of the proximal second left toe. No acute bony abnormalities. Patient admitted to inpatient for further evaluation and treatment.  Hospital Course:  Principal Problem:   Cellulitis of left foot Active Problems:   HYPERCHOLESTEROLEMIA   BIPOLAR DISORDER UNSPECIFIED   Essential hypertension   Coronary atherosclerosis   DEGENERATIVE DISC DISEASE, LUMBAR SPINE   Backache   Automatic implantable cardioverter-defibrillator in situ   Chronic combined systolic and diastolic congestive heart failure (HCC)   COPD (chronic obstructive pulmonary disease) (HCC)  Cellulitis of left dorsal foot: Patient has tenderness, warmth and erythema in the left dorsal foot and erythema extended to lower leg, consistent with cellulitis.  The purple discoloration of feet is likely due to bruise, rather than ischemia given good pulses in both feet. Patient is not septic. Lactate is normal.  Hemodynamically stable.  - Empiric antimicrobial treatment with vancomycin and Zosyn per pharmacy - Blood cultures x 2 no growth so far, mrsa screen negative - ESR,CRP and lactic acid wnl - IVF: 1.0 L of NS bolus in ED, followed by 75 cc/h x 12hrs, no off fluids -better , left foot ultrasound, +phelgmon, no drainable abscess -much improved at discharge, he is discharged with 10days of oral doxycycline.  PVD:  - ABI with moderate arterial disease on the left, IR consulted, Dr Jacqulynn Cadet input appreciated, now on prednisone in prepare for CTA leg, (h/o dye allergy) Per IR Dr Laurence Ferrari: "Moderate left lower extremity PAD which could result in claudication but is of insufficient severity to contribute to the current foot cellulitis.  Given absence of claudication symptoms, there is no indication for arterial intervention at this time."  on asa/plavix/statin, bp control. Patient report he has quit smoking 5 years ago.  Back pain:  s/p lumbar fusion. Now presents with worsening back pain, has sciatica feature with numbness in both legs. -CT-lumbar spin without contrast (pt is allergic to iodine, cannot do MRI due to present of pacemaker) , no acute findings, does has loose screw on thoracic spine ( please see separate CT lumbar spine report) , refer to outpatient orthopedic surgery for further eval. -Pain control with when necessary Percocet, patient constantly ask for pain meds, ? Drug seeking behavior? Avoid excessive narcotics. Patient insists on requesting percocet at discharge, percocet 5/325 total of 5 tabs prescribed at discharge.   Hypertension: Blood pressure 114/78 -Hold HCTZ since patient needs IV fluid due to the risk of developing sepsis --bp improved , restart hctz. bp elevated, patient report he has been taking lisinopril which was not on home med list,  Started lisinopril and coreg at discharge, further blood pressure meds titration per outpatient MD.  CAD: S/P of  CABG. No chest pain -Continue aspirin, Lipitor, Plavix, per chart review patient was on lopressor/lisinopril in 2013, but these meds are not on his home meds this time -started patient on lisinopril and coreg at discharge  Hx of combined systolic and diastolic congestive heart failure [facility]):  S/p ICD placement. 2-D echo on 11/22/11 showed EF 30-35% with grade 2 diastolic dysfunction. Patient is on HCTZ. No leg edema or JVD. CHF is compensated. -BNP 199 -Continue aspirin, hctz only, (per chart review patient was on lopressor/lisinopril in 2013, but these meds are not on his home meds this time) ,consider repeat echo as outpatient , patient is started on lisinopril and coreg, continue adjust meds per outpatient MD.  HLD: Last LDL was 73 on 11/22/11 -Continue home medications: Lipitor  BIPOLAR DISORDER UNSPECIFIED: -Continue home medications: Depakote, Lexapro  GERD: -Protonix  COPD: stable. -When necessary alternate her nebulizer and DuoNeb nebulizers   DVT ppx: SQ Lovenox   Code Status: full  Family Communication: patient and police officer in the room  Disposition Plan: back to prison on 3/12   Consultants:  Interventional radiology Dr Myrle Sheng.  Procedures:  CTA left leg ( with steroid prep due to reported past history of dye allergy)  Antibiotics:  Vanc/zosyn from admission   Discharge Exam: BP 168/94 mmHg  Pulse 82  Temp(Src) 97.7 F (36.5 C) (Oral)  Resp 16  Ht 6' (1.829 m)  Wt 86.274 kg (190 lb 3.2 oz)  BMI 25.79 kg/m2  SpO2  97%   General: NAD  Cardiovascular: RRR  Respiratory: CTABL  Abdomen: Soft/ND/NT, positive BS  Musculoskeletal: 2+DP/PT pulse bilaterally which is confirmed by portable Doppler. Left foot  (dorum) swelling,  Erythematous has almost resolved, lower left leg erythema has completely resolved  Neuro: aox3   Discharge Instructions You were cared for by a hospitalist during your hospital stay. If you have any questions about your  discharge medications or the care you received while you were in the hospital after you are discharged, you can call the unit and asked to speak with the hospitalist on call if the hospitalist that took care of you is not available. Once you are discharged, your primary care physician will handle any further medical issues. Please note that NO REFILLS for any discharge medications will be authorized once you are discharged, as it is imperative that you return to your primary care physician (or establish a relationship with a primary care physician if you do not have one) for your aftercare needs so that they can reassess your need for medications and monitor your lab values.      Discharge Instructions    Diet - low sodium heart healthy    Complete by:  As directed      Increase activity slowly    Complete by:  As directed             Medication List    TAKE these medications        albuterol (2.5 MG/3ML) 0.083% nebulizer solution  Commonly known as:  PROVENTIL  Take 2.5 mg by nebulization 2 (two) times daily as needed for wheezing or shortness of breath. For shortness of breath.     aspirin 81 MG chewable tablet  Chew 81 mg by mouth daily.     atorvastatin 80 MG tablet  Commonly known as:  LIPITOR  Take 80 mg by mouth daily.     carvedilol 3.125 MG tablet  Commonly known as:  COREG  Take 1 tablet (3.125 mg total) by mouth 2 (two) times daily with a meal.     clopidogrel 75 MG tablet  Commonly known as:  PLAVIX  Take 75 mg by mouth daily.     divalproex 500 MG DR tablet  Commonly known as:  DEPAKOTE  Take 500 mg by mouth 2 (two) times daily.     doxycycline 100 MG capsule  Commonly known as:  VIBRAMYCIN  Take 1 capsule (100 mg total) by mouth 2 (two) times daily.     escitalopram 5 MG tablet  Commonly known as:  LEXAPRO  Take 15 mg by mouth every evening.     gabapentin 600 MG tablet  Commonly known as:  NEURONTIN  Take 600 mg by mouth 3 (three) times daily.      hydrochlorothiazide 25 MG tablet  Commonly known as:  HYDRODIURIL  Take 25 mg by mouth daily.     hydrOXYzine 25 MG capsule  Commonly known as:  VISTARIL  Take 25 mg by mouth every evening.     ipratropium-albuterol 0.5-2.5 (3) MG/3ML Soln  Commonly known as:  DUONEB  Take 3 mLs by nebulization 2 (two) times daily as needed (shortness of breath).     lisinopril 10 MG tablet  Commonly known as:  ZESTRIL  Take 2 tablets (20 mg total) by mouth daily.     loratadine 10 MG tablet  Commonly known as:  CLARITIN  Take 10 mg by mouth daily.     magnesium oxide 400 (  241.3 Mg) MG tablet  Commonly known as:  MAG-OX  Take 400 mg by mouth daily.     meclizine 25 MG tablet  Commonly known as:  ANTIVERT  Take 25 mg by mouth 3 (three) times daily as needed for dizziness.     oxyCODONE-acetaminophen 5-325 MG tablet  Commonly known as:  PERCOCET/ROXICET  Take 1 tablet by mouth every 6 (six) hours as needed for severe pain.     pantoprazole 40 MG tablet  Commonly known as:  PROTONIX  Take 40 mg by mouth daily.     traMADol 50 MG tablet  Commonly known as:  ULTRAM  Take 50 mg by mouth 3 (three) times daily as needed for moderate pain.       Allergies  Allergen Reactions  . Amitriptyline Hcl     unknown  . Codeine     unknown  . Iohexol      Desc: PT STATES THAT HE HAS CONVULSIONS/SEIZURES S/P CONTRAST.  PT WAS GIVEN 1 HR PREMEDS AND HAD NO PROBLEMS.  STEPHANIE DAVIS RT-RCT, Onset Date: [id number]   . Ivp Dye [Iodinated Diagnostic Agents]     unknown  . Nifedipine     unknown   Follow-up Information    Follow up with need to repeat CT chest to follow up on the lung nodule in right lower lobe.      Follow up with need to follow up with orthopedic surgery for loose screws on thoracic spine.      Follow up with consider repeat echocardiogram to evaluate EF and furter titrate heart medicine.       The results of significant diagnostics from this hospitalization (including  imaging, microbiology, ancillary and laboratory) are listed below for reference.    Significant Diagnostic Studies: Ct Lumbar Spine Wo Contrast  12/17/2015  CLINICAL DATA:  Low back pain. Prior spinal fusion. Left foot swelling for 5 weeks. Bilateral foot numbness for 1 week. EXAM: CT LUMBAR SPINE WITHOUT CONTRAST TECHNIQUE: Multidetector CT imaging of the lumbar spine was performed without intravenous contrast administration. Multiplanar CT image reconstructions were also generated. COMPARISON:  Lumbar spine radiographs 10/29/2014 and lumbar spine CT 01/21/2012 FINDINGS: The spine is imaged from the mid T12 to S2-3 level. Sequelae of prior posterior spinal fusion are again identified. The superior extent of the fusion was not imaged. The right L2 screw is again noted to traverse the lateral recess. Prominent lucency is again seen about both S1 screws consistent with loosening. Chronic L5-S1 pseudarthrosis and vacuum disc phenomenon are noted with slightly increased subsidence of the interbody spacer into the S1 superior endplate. There is new auto fusion across the L1-2 and L4-5 disc spaces. There are also now solid posterolateral fusion masses bilaterally from T12-L5. Mild thoracolumbar dextroscoliosis is partially visualized. There is no lumbar listhesis. Vertebral body heights are preserved without evidence of compression fracture. An L3 superior endplate Schmorl's node is again seen. Sclerotic and cystic endplate changes are present L5-S1. Age advanced atherosclerotic calcification is noted of the abdominal aorta and its major branch vessels. T12-L1:  Endplate spurring without significant stenosis, unchanged. L1-2: Prior posterior decompression and fusion. Mild left osseous neural foraminal stenosis is unchanged. Spinal canal is patent. L2-3: Prior posterior decompression and fusion. Mild annular bulging and calcification without stenosis. L3-4: Prior posterior decompression and fusion. Mild annular bulging  and calcification without significant stenosis. L4-5: Prior posterior decompression and fusion. Mild-to-moderate osseous neural foraminal narrowing on the right is similar to the prior study. Spinal canal  is widely patent. L5-S1: Prior posterior decompression and fusion. Spinal canal is widely patent. There is likely mild to moderate right neural foraminal stenosis due to disc bulging and endplate spurring which may have mildly increased. Left neural foramen appears adequately patent. IMPRESSION: 1. Prior posterior spinal fusion with chronic L5-S1 pseudarthrosis and chronic S1 screw loosening. Solid posterolateral fusion masses. 2. New fusion across the L1-2 and L4-5 disc spaces. 3. Mild-to-moderate right neural foraminal stenosis at L4-5 and L5-S1, possibly increased at the latter. Electronically Signed   By: Logan Bores M.D.   On: 12/17/2015 08:09   Ct Angio Ao+bifem W/cm &/or Wo/cm  12/21/2015  ADDENDUM REPORT: 12/21/2015 10:22 ADDENDUM: These results were called by telephone at the time of interpretation on 12/21/2015 at 10:22 am to Dr. Florencia Reasons, who verbally acknowledged these results. Electronically Signed   By: Jacqulynn Cadet M.D.   On: 12/21/2015 10:22  12/21/2015  CLINICAL DATA:  65 year old male with spontaneous cellulitis of the left lower extremity in the setting of decreased ankle-brachial indices. Evaluate for extent of peripheral arterial disease. EXAM: CT ANGIOGRAPHY OF ABDOMINAL AORTA WITH ILIOFEMORAL RUNOFF TECHNIQUE: Multidetector CT imaging of the abdomen, pelvis and lower extremities was performed using the standard protocol during bolus administration of intravenous contrast. Multiplanar CT image reconstructions and MIPs were obtained to evaluate the vascular anatomy. CONTRAST:  175m OMNIPAQUE IOHEXOL 350 MG/ML SOLN COMPARISON:  CT scan of the lumbar spine 12/17/2015 ; CT scan of the thoracic spine 09/09/2011 FINDINGS: VASCULAR Aorta: Normal caliber abdominal aorta with peripheral mixed  calcific and fibro fatty plaque. No evidence of dissection, penetrating ulcer or other acute abnormality. Celiac: The celiac artery is widely patent. Conventional hepatic arterial anatomy. No visceral artery aneurysm. SMA: Patent and unremarkable. Renals: Solitary dominant renal arteries bilaterally. Moderate stenosis secondary to fibro fatty atherosclerotic plaque in the proximal 2 cm of the left renal artery. There may be mild stenosis at the origin of the right renal artery. IMA: Occluded at the origin. The distal branches reconstitutes via collateral flow. RIGHT Lower Extremity Inflow: Multifocal atherosclerotic plaque. There is day mild to moderate focal stenosis at the origin of the external iliac artery. The common iliac artery is widely patent. Outflow: Common, profunda and superficial femoral arteries remain patent. Short segment mild to moderate stenosis in the distal superficial femoral artery. The popliteal artery is widely patent. Runoff: Three-vessel runoff to the ankle. LEFT lower Extremity Inflow: Short segment high-grade stenosis of the left common iliac artery secondary to fibro fatty plaque. Short segment non flow limiting dissection in the proximal common iliac artery. The external iliac artery is mildly diseased but there is no hemodynamically significant stenosis. The internal iliac artery remains patent. Outflow: Common and profunda femoral arteries are widely patent. The superficial femoral artery is also widely patent and relatively disease free. The popliteal artery is patent and disease free. Runoff: Three-vessel runoff to the ankle. The dorsalis pedis artery is patent. Veins: No focal venous abnormality. Review of the MIP images confirms the above findings. NON-VASCULAR Lower Chest: A 7 mm nodule is noted in the medial aspect of the right lower lobe in the azygoesophageal recess (image 13 series 4). This was not present on the prior CT scan of the thoracic spine from November of 2012. The  lung bases are otherwise clear. Mild cardiomegaly. Incompletely imaged cardiac rhythm maintenance device. The lead terminates in the right ventricle. No pericardial effusion. Unremarkable distal thoracic esophagus. Abdomen: Unremarkable CT appearance of the stomach, duodenum, spleen, adrenal  glands and pancreas. Normal hepatic contour and morphology. No discrete hepatic lesion. Gallbladder is unremarkable. No intra or extrahepatic biliary ductal dilatation. Unremarkable appearance of the bilateral kidneys. No focal solid lesion, hydronephrosis or nephrolithiasis. No evidence of obstruction or focal bowel wall thickening. Normal appendix in the right lower quadrant. The terminal ileum is unremarkable. Pelvis: Unremarkable bladder, prostate gland and seminal vesicles. No free fluid or suspicious adenopathy. Bones/Soft Tissues: No acute fracture or aggressive appearing lytic or blastic osseous lesion. Extensive multilevel degenerative disc disease. Long segment posterior thoraco lumbar fusion extending from T10 through S1. Of note, the right pedicle screw at T10 is extra pedicular and anchored within the head of the rib. The right pedicle screw at T11 is also extra pedicular and anchored in the lateral aspect of the vertebral body. There is mild lucency surrounding the S1 screws. Multilevel laminectomy defects are present. Interbody graft at L5-S1. No significant change compared to recent prior imaging. Focal soft tissue swelling over the dorsum of the foot. No focal fluid collection. IMPRESSION: VASCULAR 1. Moderate focal inflow disease on the left. Short segment moderate to high-grade stenosis of the mid aspect of the left common iliac artery. 2. The left outflow and runoff vessels are relatively disease free. There is in-line flow to the region of cellulitis at the dorsum of the left foot. 3. Mild to moderate focal outflow disease on the left. There is a relatively short segment focal stenosis of the distal right  superficial femoral artery. Perhaps mild to moderate stenosis in the external iliac artery as well. Three-vessel runoff to the foot. 4. Moderate focal stenosis of the proximal left renal artery. 5. 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 NON VASCULAR 1. New 7 mm nodule in the medial aspect of the right lower lobe compared to prior CT scan of the thoracic spine from November of 2012. This is concerning for a developing primary bronchogenic carcinoma given the patient's extensive past smoking history. The nodule is likely below the level of resolution for PET-CT at this time. Recommend short interval follow-up with repeat CT scan of the chest in 3 months. If the nodule enlarges, then PET-CT would be warranted. 2. Cardiomegaly 3. Soft tissue swelling over the dorsum of the foot without associated focal fluid collection. 4. No significant interval change in the appearance of the thoraco lumbar fusion compared to recent prior imaging from 12/17/2015. Signed, Criselda Peaches, MD [facility] Electronically Signed: By: Jacqulynn Cadet M.D. On: 12/21/2015 08:52   [place] Extrem Low Left Ltd  12/19/2015  CLINICAL DATA:  Evaluate for abscess. EXAM: ULTRASOUND left LOWER EXTREMITY LIMITED TECHNIQUE: Ultrasound examination of the lower extremity soft tissues was performed in the area of clinical concern. COMPARISON:  Radiography 2 days ago FINDINGS: Dorsal forefoot (approximately at the MTP level) subcutaneous tissue expansion with ill-defined tracking fluid. No mature or or discrete abscess. No fluid tracking along the visible tendons to suggest tenosynovitis. The metallic foreign body seen on previous radiography was not visualized. IMPRESSION: Dorsal forefoot cellulitis and phlegmon without drainable collection. Electronically Signed   By: Monte Fantasia M.D.   On: 12/19/2015 22:24   Dg Foot Complete  Left  12/17/2015  CLINICAL DATA:  Left foot swelling and discoloration for 5 weeks. EXAM: LEFT FOOT - COMPLETE 3+ VIEW COMPARISON:  None. FINDINGS: Small linear metallic foreign body demonstrated in the soft tissues over the plantar aspect of the proximal left second toe. No  evidence of acute fracture or dislocation. No focal bone lesion or bone destruction. Bone cortex appears intact. Surgical clips in the left lower leg. IMPRESSION: Linear metallic foreign body demonstrated in the soft tissues over the plantar aspect of the proximal second left toe. No acute bony abnormalities. Electronically Signed   By: Lucienne Capers M.D.   On: 12/17/2015 04:45   Dg Foot Complete Right  12/17/2015  CLINICAL DATA:  Right foot purple discoloration in numbness to the feet for 1 week. EXAM: RIGHT FOOT COMPLETE - 3+ VIEW COMPARISON:  None. FINDINGS: There is no evidence of fracture or dislocation. There is no evidence of arthropathy or other focal bone abnormality. Soft tissues are unremarkable. IMPRESSION: Negative. Electronically Signed   By: Lucienne Capers M.D.   On: 12/17/2015 04:46    Microbiology: Recent Results (from the past 240 hour(s))  Blood culture (routine x 2)     Status: None (Preliminary result)   Collection Time: 12/17/15  3:14 AM  Result Value Ref Range Status   Specimen Description BLOOD RIGHT HAND  Final   Special Requests BOTTLES DRAWN AEROBIC AND ANAEROBIC 5CC   Final   Culture NO GROWTH 4 DAYS  Final   Report Status PENDING  Incomplete  Blood culture (routine x 2)     Status: None (Preliminary result)   Collection Time: 12/17/15  3:24 AM  Result Value Ref Range Status   Specimen Description BLOOD LEFT ARM  Final   Special Requests BOTTLES DRAWN AEROBIC AND ANAEROBIC 5ML  Final   Culture NO GROWTH 4 DAYS  Final   Report Status PENDING  Incomplete  MRSA PCR Screening     Status: None   Collection Time: 12/17/15  7:39 PM  Result Value Ref Range Status   MRSA by PCR NEGATIVE NEGATIVE  Final    Comment:        The GeneXpert MRSA Assay (FDA approved for NASAL specimens only), is one component of a comprehensive MRSA colonization surveillance program. It is not intended to diagnose MRSA infection nor to guide or monitor treatment for MRSA infections.      Labs: Basic Metabolic Panel:  Recent Labs Lab 12/17/15 0322 12/18/15 0533 12/20/15 0542  NA 140 141 139  K 4.1 4.2 4.3  CL 106 105 103  CO2 _0 GLUCOSE 84 76 85  BUN _1 CREATININE 1.21 1.13 1.09  CALCIUM 9.5 8.9 9.2  MG  --  1.7 1.8   Liver Function Tests:  Recent Labs Lab 12/17/15 0322  AST 20  ALT 15*  ALKPHOS 107  BILITOT 0.6  PROT 6.2*  ALBUMIN 3.6   No results for input(s): LIPASE, AMYLASE in the last 168 hours. No results for input(s): AMMONIA in the last 168 hours. CBC:  Recent Labs Lab 12/17/15 0322 12/18/15 0533 12/20/15 0542  WBC 7.3 5.2 4.4  NEUTROABS 4.5  --   --   HGB 13.2 13.0 13.2  HCT 40.5 39.0 40.7  MCV 88.8 90.7 88.1  PLT 175 153 177   Cardiac Enzymes: No results for input(s): CKTOTAL, CKMB, CKMBINDEX, TROPONINI in the last 168 hours. BNP: BNP (last 3 results)  Recent Labs  12/18/15 0533  BNP 199.6*    ProBNP (last 3 results) No results for input(s): PROBNP in the last 8760 hours.  CBG: No results for input(s): GLUCAP in the last 168 hours.     SignedFlorencia Reasons MD, PhD  [facility] 12/21/2015, 11:32 AM

## 2015-12-21 NOTE — Progress Notes (Signed)
Referring Physician(s): Albertine Grates   Chief Complaint:  Left foot cellulitis  Subjective:  Feeling somewhat better today, foot and back still hurt.  No adverse sequela following CTA.   Allergies: Amitriptyline hcl; Codeine; Iohexol; Ivp dye; and Nifedipine  Medications: Prior to Admission medications   Medication Sig Start Date End Date Taking? Authorizing Provider  albuterol (PROVENTIL) (2.5 MG/3ML) 0.083% nebulizer solution Take 2.5 mg by nebulization 2 (two) times daily as needed for wheezing or shortness of breath. For shortness of breath.   Yes Historical Provider, MD  aspirin 81 MG chewable tablet Chew 81 mg by mouth daily.   Yes Historical Provider, MD  atorvastatin (LIPITOR) 80 MG tablet Take 80 mg by mouth daily.   Yes Historical Provider, MD  clopidogrel (PLAVIX) 75 MG tablet Take 75 mg by mouth daily.   Yes Historical Provider, MD  divalproex (DEPAKOTE) 500 MG DR tablet Take 500 mg by mouth 2 (two) times daily.   Yes Historical Provider, MD  escitalopram (LEXAPRO) 5 MG tablet Take 15 mg by mouth every evening.   Yes Historical Provider, MD  gabapentin (NEURONTIN) 600 MG tablet Take 600 mg by mouth 3 (three) times daily.   Yes Historical Provider, MD  hydrochlorothiazide (HYDRODIURIL) 25 MG tablet Take 25 mg by mouth daily.   Yes Historical Provider, MD  hydrOXYzine (VISTARIL) 25 MG capsule Take 25 mg by mouth every evening.   Yes Historical Provider, MD  ipratropium-albuterol (DUONEB) 0.5-2.5 (3) MG/3ML SOLN Take 3 mLs by nebulization 2 (two) times daily as needed (shortness of breath).   Yes Historical Provider, MD  loratadine (CLARITIN) 10 MG tablet Take 10 mg by mouth daily.   Yes Historical Provider, MD  magnesium oxide (MAG-OX) 400 (241.3 Mg) MG tablet Take 400 mg by mouth daily.   Yes Historical Provider, MD  meclizine (ANTIVERT) 25 MG tablet Take 25 mg by mouth 3 (three) times daily as needed for dizziness.   Yes Historical Provider, MD  pantoprazole (PROTONIX) 40  MG tablet Take 40 mg by mouth daily.   Yes Historical Provider, MD  traMADol (ULTRAM) 50 MG tablet Take 50 mg by mouth 3 (three) times daily as needed for moderate pain.   Yes Historical Provider, MD     Vital Signs: BP 152/97 mmHg  Pulse 81  Temp(Src) 97.7 F (36.5 C) (Oral)  Resp 16  Ht 6' (1.829 m)  Wt 190 lb 3.2 oz (86.274 kg)  BMI 25.79 kg/m2  SpO2 96%  Physical Exam  Imaging: Ct Angio Ao+bifem W/cm &/or Wo/cm  12/21/2015  CLINICAL DATA:  65 year old male with spontaneous cellulitis of the left lower extremity in the setting of decreased ankle-brachial indices. Evaluate for extent of peripheral arterial disease. EXAM: CT ANGIOGRAPHY OF ABDOMINAL AORTA WITH ILIOFEMORAL RUNOFF TECHNIQUE: Multidetector CT imaging of the abdomen, pelvis and lower extremities was performed using the standard protocol during bolus administration of intravenous contrast. Multiplanar CT image reconstructions and MIPs were obtained to evaluate the vascular anatomy. CONTRAST:  OMNIPAQUE IOHEXOL 350 MG/ML SOLN COMPARISON:  CT scan of the lumbar spine 12/17/2015 ; CT scan of the thoracic spine 09/09/2011 FINDINGS: VASCULAR Aorta: Normal caliber abdominal aorta with peripheral mixed calcific and fibro fatty plaque. No evidence of dissection, penetrating ulcer or other acute abnormality. Celiac: The celiac artery is widely patent. Conventional hepatic arterial anatomy. No visceral artery aneurysm. SMA: Patent and unremarkable. Renals: Solitary dominant renal arteries bilaterally. Moderate stenosis secondary to fibro fatty atherosclerotic plaque in the proximal 2 cm  of the left renal artery. There may be mild stenosis at the origin of the right renal artery. IMA: Occluded at the origin. The distal branches reconstitutes via collateral flow. RIGHT Lower Extremity Inflow: Multifocal atherosclerotic plaque. There is day mild to moderate focal stenosis at the origin of the external iliac artery. The common iliac artery is  widely patent. Outflow: Common, profunda and superficial femoral arteries remain patent. Short segment mild to moderate stenosis in the distal superficial femoral artery. The popliteal artery is widely patent. Runoff: Three-vessel runoff to the ankle. LEFT lower Extremity Inflow: Short segment high-grade stenosis of the left common iliac artery secondary to fibro fatty plaque. Short segment non flow limiting dissection in the proximal common iliac artery. The external iliac artery is mildly diseased but there is no hemodynamically significant stenosis. The internal iliac artery remains patent. Outflow: Common and profunda femoral arteries are widely patent. The superficial femoral artery is also widely patent and relatively disease free. The popliteal artery is patent and disease free. Runoff: Three-vessel runoff to the ankle. The dorsalis pedis artery is patent. Veins: No focal venous abnormality. Review of the MIP images confirms the above findings. NON-VASCULAR Lower Chest: A 7 mm nodule is noted in the medial aspect of the right lower lobe in the azygoesophageal recess (image 13 series 4). This was not present on the prior CT scan of the thoracic spine from November of 2012. The lung bases are otherwise clear. Mild cardiomegaly. Incompletely imaged cardiac rhythm maintenance device. The lead terminates in the right ventricle. No pericardial effusion. Unremarkable distal thoracic esophagus. Abdomen: Unremarkable CT appearance of the stomach, duodenum, spleen, adrenal glands and pancreas. Normal hepatic contour and morphology. No discrete hepatic lesion. Gallbladder is unremarkable. No intra or extrahepatic biliary ductal dilatation. Unremarkable appearance of the bilateral kidneys. No focal solid lesion, hydronephrosis or nephrolithiasis. No evidence of obstruction or focal bowel wall thickening. Normal appendix in the right lower quadrant. The terminal ileum is unremarkable. Pelvis: Unremarkable bladder,  prostate gland and seminal vesicles. No free fluid or suspicious adenopathy. Bones/Soft Tissues: No acute fracture or aggressive appearing lytic or blastic osseous lesion. Extensive multilevel degenerative disc disease. Long segment posterior thoraco lumbar fusion extending from T10 through S1. Of note, the right pedicle screw at T10 is extra pedicular and anchored within the head of the rib. The right pedicle screw at T11 is also extra pedicular and anchored in the lateral aspect of the vertebral body. There is mild lucency surrounding the S1 screws. Multilevel laminectomy defects are present. Interbody graft at L5-S1. No significant change compared to recent prior imaging. Focal soft tissue swelling over the dorsum of the foot. No focal fluid collection. IMPRESSION: VASCULAR 1. Moderate focal inflow disease on the left. Short segment moderate to high-grade stenosis of the mid aspect of the left common iliac artery. 2. The left outflow and runoff vessels are relatively disease free. There is in-line flow to the region of cellulitis at the dorsum of the left foot. 3. Mild to moderate focal outflow disease on the left. There is a relatively short segment focal stenosis of the distal right superficial femoral artery. Perhaps mild to moderate stenosis in the external iliac artery as well. Three-vessel runoff to the foot. 4. Moderate focal stenosis of the proximal left renal artery. 5. 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 NON VASCULAR 1. New 7 mm nodule in the medial aspect of the right lower lobe compared to prior CT scan of the  thoracic spine from November of 2012. This is concerning for a developing primary bronchogenic carcinoma given the patient's extensive past smoking history. The nodule is likely below the level of resolution for PET-CT at this time. Recommend short interval follow-up with repeat CT scan of the chest in 3 months. If the nodule enlarges, then  PET-CT would be warranted. 2. Cardiomegaly 3. Soft tissue swelling over the dorsum of the foot without associated focal fluid collection. 4. No significant interval change in the appearance of the thoraco lumbar fusion compared to recent prior imaging from 12/17/2015. Signed, Sterling BigHeath K. McCullough, MD [facility] Electronically Signed   By: Malachy MoanHeath  McCullough M.D.   On: 12/21/2015 08:52   [place] Extrem Low Left Ltd  12/19/2015  CLINICAL DATA:  Evaluate for abscess. EXAM: ULTRASOUND left LOWER EXTREMITY LIMITED TECHNIQUE: Ultrasound examination of the lower extremity soft tissues was performed in the area of clinical concern. COMPARISON:  Radiography 2 days ago FINDINGS: Dorsal forefoot (approximately at the MTP level) subcutaneous tissue expansion with ill-defined tracking fluid. No mature or or discrete abscess. No fluid tracking along the visible tendons to suggest tenosynovitis. The metallic foreign body seen on previous radiography was not visualized. IMPRESSION: Dorsal forefoot cellulitis and phlegmon without drainable collection. Electronically Signed   By: Marnee SpringJonathon  Watts M.D.   On: 12/19/2015 22:24    Labs:  CBC:  Recent Labs  12/17/15 0322 12/18/15 0533 12/20/15 0542  WBC 7.3 5.2 4.4  HGB 13.2 13.0 13.2  HCT 40.5 39.0 40.7  PLT 175 153 177    COAGS:  Recent Labs  12/17/15 0557  INR 1.13  APTT 32    BMP:  Recent Labs  12/17/15 0322 12/18/15 0533 12/20/15 0542  NA 140 141 139  K 4.1 4.2 4.3  CL 106 105 103  CO2 23 28 26   GLUCOSE 84 76 85  BUN 15 14 13   CALCIUM 9.5 8.9 9.2  CREATININE 1.21 1.13 1.09  GFRNONAA >60 >60 >60  GFRAA >60 >60 >60    LIVER FUNCTION TESTS:  Recent Labs  12/17/15 0322  BILITOT 0.6  AST 20  ALT 15*  ALKPHOS 107  PROT 6.2*  ALBUMIN 3.6    Assessment and Plan:  Moderate left lower extremity PAD which could result in claudication but is of insufficient severity to contribute  to the current foot cellulitis.    Given absence of claudication symptoms, there is no indication for arterial intervention at this time.    Pt improving on IV abx.    - Small 7 mm nodule note din the RLL on his CTA Runoff study.  Given his smoking history, this warrants follow-up with repeat CT chest in 3 months to assess for growth.    - No barriers to DC from IR perspective.   Electronically Signed: Malachy MoanMCCULLOUGH, HEATH 12/21/2015, 10:01 AM

## 2015-12-22 LAB — CULTURE, BLOOD (ROUTINE X 2)
CULTURE: NO GROWTH
Culture: NO GROWTH

## 2018-10-31 ENCOUNTER — Telehealth: Payer: Self-pay | Admitting: *Deleted

## 2018-10-31 NOTE — Telephone Encounter (Signed)
REFERRAL SENT TO SCHEDULING AND NOTES ON FILE.. °

## 2018-11-28 ENCOUNTER — Encounter: Payer: Self-pay | Admitting: Internal Medicine

## 2018-11-29 ENCOUNTER — Encounter: Payer: Self-pay | Admitting: Internal Medicine

## 2018-11-30 ENCOUNTER — Encounter: Payer: Self-pay | Admitting: Internal Medicine

## 2018-12-01 DIAGNOSIS — H5712 Ocular pain, left eye: Secondary | ICD-10-CM | POA: Diagnosis not present

## 2018-12-01 DIAGNOSIS — W01198A Fall on same level from slipping, tripping and stumbling with subsequent striking against other object, initial encounter: Secondary | ICD-10-CM | POA: Diagnosis not present

## 2018-12-01 DIAGNOSIS — Y998 Other external cause status: Secondary | ICD-10-CM | POA: Diagnosis not present

## 2018-12-01 DIAGNOSIS — S0990XA Unspecified injury of head, initial encounter: Secondary | ICD-10-CM | POA: Diagnosis not present

## 2018-12-07 DIAGNOSIS — F17210 Nicotine dependence, cigarettes, uncomplicated: Secondary | ICD-10-CM | POA: Diagnosis not present

## 2018-12-07 DIAGNOSIS — G8929 Other chronic pain: Secondary | ICD-10-CM | POA: Diagnosis not present

## 2018-12-07 DIAGNOSIS — M545 Low back pain: Secondary | ICD-10-CM | POA: Diagnosis not present

## 2018-12-22 ENCOUNTER — Encounter: Payer: Self-pay | Admitting: Internal Medicine

## 2018-12-27 ENCOUNTER — Telehealth: Payer: Self-pay

## 2018-12-27 DIAGNOSIS — J449 Chronic obstructive pulmonary disease, unspecified: Secondary | ICD-10-CM | POA: Diagnosis not present

## 2018-12-27 DIAGNOSIS — W19XXXA Unspecified fall, initial encounter: Secondary | ICD-10-CM | POA: Diagnosis not present

## 2018-12-27 DIAGNOSIS — I10 Essential (primary) hypertension: Secondary | ICD-10-CM | POA: Diagnosis not present

## 2018-12-27 DIAGNOSIS — G40909 Epilepsy, unspecified, not intractable, without status epilepticus: Secondary | ICD-10-CM | POA: Diagnosis not present
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
## 2019-01-02 DIAGNOSIS — M4714 Other spondylosis with myelopathy, thoracic region: Secondary | ICD-10-CM | POA: Diagnosis not present

## 2019-01-02 DIAGNOSIS — G894 Chronic pain syndrome: Secondary | ICD-10-CM | POA: Diagnosis not present

## 2019-01-02 DIAGNOSIS — S32009K Unspecified fracture of unspecified lumbar vertebra, subsequent encounter for fracture with nonunion: Secondary | ICD-10-CM | POA: Diagnosis not present

## 2019-01-03 ENCOUNTER — Encounter: Payer: Medicaid Other | Admitting: Internal Medicine

## 2019-01-22 DIAGNOSIS — L03211 Cellulitis of face: Secondary | ICD-10-CM | POA: Diagnosis not present

## 2019-01-31 DIAGNOSIS — M4714 Other spondylosis with myelopathy, thoracic region: Secondary | ICD-10-CM | POA: Diagnosis not present

## 2019-01-31 DIAGNOSIS — G894 Chronic pain syndrome: Secondary | ICD-10-CM | POA: Diagnosis not present

## 2019-02-27 DIAGNOSIS — G894 Chronic pain syndrome: Secondary | ICD-10-CM | POA: Diagnosis not present

## 2019-02-27 DIAGNOSIS — M5136 Other intervertebral disc degeneration, lumbar region: Secondary | ICD-10-CM | POA: Diagnosis not present

## 2019-02-27 DIAGNOSIS — M4714 Other spondylosis with myelopathy, thoracic region: Secondary | ICD-10-CM | POA: Diagnosis not present

## 2019-03-15 DIAGNOSIS — M4714 Other spondylosis with myelopathy, thoracic region: Secondary | ICD-10-CM | POA: Diagnosis not present

## 2019-03-15 DIAGNOSIS — G894 Chronic pain syndrome: Secondary | ICD-10-CM | POA: Diagnosis not present

## 2019-03-21 ENCOUNTER — Telehealth: Payer: Self-pay | Admitting: *Deleted

## 2019-03-21 NOTE — Telephone Encounter (Signed)
Left patient a message letting him know we need to reschedule his new patient appt that was on 03/22/2019. We can offer him an in-office visit anytime in July.  I also tried to reach out to his daughter, who was listed in his emergency contacts, to notify of this schedule change.  The number rang busy with multiple attempts made.

## 2019-03-21 NOTE — Telephone Encounter (Signed)
I spoke to the patient and he has been reschedule in July.  He preferred an in-office visit.

## 2019-03-22 ENCOUNTER — Ambulatory Visit: Payer: Medicaid Other | Admitting: Neurology

## 2019-03-27 DIAGNOSIS — I160 Hypertensive urgency: Secondary | ICD-10-CM | POA: Diagnosis not present

## 2019-03-27 DIAGNOSIS — I255 Ischemic cardiomyopathy: Secondary | ICD-10-CM | POA: Diagnosis not present

## 2019-03-27 DIAGNOSIS — I110 Hypertensive heart disease with heart failure: Secondary | ICD-10-CM | POA: Diagnosis not present

## 2019-03-27 DIAGNOSIS — F17210 Nicotine dependence, cigarettes, uncomplicated: Secondary | ICD-10-CM | POA: Diagnosis not present

## 2019-03-27 DIAGNOSIS — R072 Precordial pain: Secondary | ICD-10-CM | POA: Diagnosis not present

## 2019-03-27 DIAGNOSIS — R252 Cramp and spasm: Secondary | ICD-10-CM | POA: Diagnosis not present

## 2019-03-27 DIAGNOSIS — Z951 Presence of aortocoronary bypass graft: Secondary | ICD-10-CM | POA: Diagnosis not present

## 2019-03-27 DIAGNOSIS — Z981 Arthrodesis status: Secondary | ICD-10-CM | POA: Diagnosis not present

## 2019-03-27 DIAGNOSIS — Z720 Tobacco use: Secondary | ICD-10-CM | POA: Diagnosis not present

## 2019-03-27 DIAGNOSIS — Z8249 Family history of ischemic heart disease and other diseases of the circulatory system: Secondary | ICD-10-CM | POA: Diagnosis not present

## 2019-03-27 DIAGNOSIS — Z7982 Long term (current) use of aspirin: Secondary | ICD-10-CM | POA: Diagnosis not present

## 2019-03-27 DIAGNOSIS — Z7902 Long term (current) use of antithrombotics/antiplatelets: Secondary | ICD-10-CM | POA: Diagnosis not present

## 2019-03-27 DIAGNOSIS — I2510 Atherosclerotic heart disease of native coronary artery without angina pectoris: Secondary | ICD-10-CM | POA: Diagnosis not present

## 2019-03-27 DIAGNOSIS — G894 Chronic pain syndrome: Secondary | ICD-10-CM | POA: Diagnosis not present

## 2019-03-27 DIAGNOSIS — R062 Wheezing: Secondary | ICD-10-CM | POA: Diagnosis not present

## 2019-03-27 DIAGNOSIS — J449 Chronic obstructive pulmonary disease, unspecified: Secondary | ICD-10-CM | POA: Diagnosis not present

## 2019-03-27 DIAGNOSIS — Z888 Allergy status to other drugs, medicaments and biological substances status: Secondary | ICD-10-CM | POA: Diagnosis not present

## 2019-03-27 DIAGNOSIS — R0602 Shortness of breath: Secondary | ICD-10-CM | POA: Diagnosis not present

## 2019-03-27 DIAGNOSIS — E876 Hypokalemia: Secondary | ICD-10-CM | POA: Diagnosis not present

## 2019-03-27 DIAGNOSIS — Z885 Allergy status to narcotic agent status: Secondary | ICD-10-CM | POA: Diagnosis not present

## 2019-03-27 DIAGNOSIS — I5043 Acute on chronic combined systolic (congestive) and diastolic (congestive) heart failure: Secondary | ICD-10-CM | POA: Diagnosis not present

## 2019-03-27 DIAGNOSIS — Z5320 Procedure and treatment not carried out because of patient's decision for unspecified reasons: Secondary | ICD-10-CM | POA: Diagnosis not present

## 2019-03-28 ENCOUNTER — Emergency Department (HOSPITAL_COMMUNITY)
Admission: EM | Admit: 2019-03-28 | Discharge: 2019-03-28 | Disposition: A | Discharge status: Home / Self Care | Payer: Medicare Other | Attending: Emergency Medicine | Admitting: Emergency Medicine

## 2019-03-28 ENCOUNTER — Emergency Department (HOSPITAL_COMMUNITY): Payer: Medicare Other

## 2019-03-28 ENCOUNTER — Other Ambulatory Visit: Payer: Self-pay

## 2019-03-28 DIAGNOSIS — I2510 Atherosclerotic heart disease of native coronary artery without angina pectoris: Secondary | ICD-10-CM | POA: Diagnosis not present

## 2019-03-28 DIAGNOSIS — I252 Old myocardial infarction: Secondary | ICD-10-CM | POA: Diagnosis not present

## 2019-03-28 DIAGNOSIS — I5043 Acute on chronic combined systolic (congestive) and diastolic (congestive) heart failure: Secondary | ICD-10-CM | POA: Insufficient documentation

## 2019-03-28 DIAGNOSIS — Z950 Presence of cardiac pacemaker: Secondary | ICD-10-CM | POA: Insufficient documentation

## 2019-03-28 DIAGNOSIS — Z951 Presence of aortocoronary bypass graft: Secondary | ICD-10-CM | POA: Diagnosis not present

## 2019-03-28 DIAGNOSIS — I110 Hypertensive heart disease with heart failure: Secondary | ICD-10-CM | POA: Diagnosis not present

## 2019-03-28 DIAGNOSIS — Z8673 Personal history of transient ischemic attack (TIA), and cerebral infarction without residual deficits: Secondary | ICD-10-CM | POA: Diagnosis not present

## 2019-03-28 DIAGNOSIS — Z7982 Long term (current) use of aspirin: Secondary | ICD-10-CM | POA: Insufficient documentation

## 2019-03-28 DIAGNOSIS — Z7902 Long term (current) use of antithrombotics/antiplatelets: Secondary | ICD-10-CM | POA: Insufficient documentation

## 2019-03-28 DIAGNOSIS — R0602 Shortness of breath: Secondary | ICD-10-CM | POA: Diagnosis not present

## 2019-03-28 DIAGNOSIS — E876 Hypokalemia: Secondary | ICD-10-CM | POA: Diagnosis not present

## 2019-03-28 DIAGNOSIS — Z79899 Other long term (current) drug therapy: Secondary | ICD-10-CM | POA: Insufficient documentation

## 2019-03-28 DIAGNOSIS — Z5320 Procedure and treatment not carried out because of patient's decision for unspecified reasons: Secondary | ICD-10-CM | POA: Diagnosis not present

## 2019-03-28 DIAGNOSIS — Z87891 Personal history of nicotine dependence: Secondary | ICD-10-CM | POA: Insufficient documentation

## 2019-03-28 DIAGNOSIS — J449 Chronic obstructive pulmonary disease, unspecified: Secondary | ICD-10-CM | POA: Diagnosis not present

## 2019-03-28 DIAGNOSIS — R062 Wheezing: Secondary | ICD-10-CM | POA: Diagnosis not present

## 2019-03-28 DIAGNOSIS — R072 Precordial pain: Secondary | ICD-10-CM | POA: Diagnosis not present

## 2019-03-28 LAB — CBC WITH DIFFERENTIAL/PLATELET
Abs Immature Granulocytes: 0.02 10*3/uL (ref 0.00–0.07)
Basophils Absolute: 0 10*3/uL (ref 0.0–0.1)
Basophils Relative: 0 %
Eosinophils Absolute: 0 10*3/uL (ref 0.0–0.5)
Eosinophils Relative: 0 %
HCT: 34.6 % — ABNORMAL LOW (ref 39.0–52.0)
Hemoglobin: 11.4 g/dL — ABNORMAL LOW (ref 13.0–17.0)
Immature Granulocytes: 0 %
Lymphocytes Relative: 20 %
Lymphs Abs: 1.9 10*3/uL (ref 0.7–4.0)
MCH: 30.5 pg (ref 26.0–34.0)
MCHC: 32.9 g/dL (ref 30.0–36.0)
MCV: 92.5 fL (ref 80.0–100.0)
Monocytes Absolute: 0.6 10*3/uL (ref 0.1–1.0)
Monocytes Relative: 7 %
Neutro Abs: 6.9 10*3/uL (ref 1.7–7.7)
Neutrophils Relative %: 73 %
Platelets: 169 10*3/uL (ref 150–400)
RBC: 3.74 MIL/uL — ABNORMAL LOW (ref 4.22–5.81)
RDW: 13.8 % (ref 11.5–15.5)
WBC: 9.5 10*3/uL (ref 4.0–10.5)
nRBC: 0 % (ref 0.0–0.2)

## 2019-03-28 LAB — BASIC METABOLIC PANEL
Anion gap: 12 (ref 5–15)
BUN: 15 mg/dL (ref 8–23)
CO2: 22 mmol/L (ref 22–32)
Calcium: 9.3 mg/dL (ref 8.9–10.3)
Chloride: 104 mmol/L (ref 98–111)
Creatinine, Ser: 1.06 mg/dL (ref 0.61–1.24)
GFR calc Af Amer: >60 mL/min (ref >60)
GFR calc non Af Amer: >60 mL/min (ref >60)
Glucose, Bld: 98 mg/dL (ref 70–99)
Potassium: 2.7 mmol/L — CL (ref 3.5–5.1)
Sodium: 138 mmol/L (ref 135–145)

## 2019-03-28 LAB — BRAIN NATRIURETIC PEPTIDE: B Natriuretic Peptide: 1215.6 pg/mL — ABNORMAL HIGH (ref 0.0–100.0)

## 2019-03-28 LAB — POCT I-STAT EG7
Acid-Base Excess: 1 mmol/L (ref 0.0–2.0)
Bicarbonate: 23.7 mmol/L (ref 20.0–28.0)
Calcium, Ion: 1.17 mmol/L (ref 1.15–1.40)
HCT: 34 % — ABNORMAL LOW (ref 39.0–52.0)
Hemoglobin: 11.6 g/dL — ABNORMAL LOW (ref 13.0–17.0)
O2 Saturation: 99 %
Potassium: 2.8 mmol/L — ABNORMAL LOW (ref 3.5–5.1)
Sodium: 140 mmol/L (ref 135–145)
TCO2: 25 mmol/L (ref 22–32)
pCO2, Ven: 32 mmHg — ABNORMAL LOW (ref 44.0–60.0)
pH, Ven: 7.477 — ABNORMAL HIGH (ref 7.250–7.430)
pO2, Ven: 116 mmHg — ABNORMAL HIGH (ref 32.0–45.0)

## 2019-03-28 LAB — TROPONIN I
Troponin I: 0.04 ng/mL (ref <0.03)
Troponin I: 0.04 ng/mL (ref <0.03)

## 2019-03-28 MED ORDER — POTASSIUM CHLORIDE CRYS ER 20 MEQ PO TBCR
40.0000 meq | EXTENDED_RELEASE_TABLET | Freq: Once | ORAL | Status: AC
  Administered 2019-03-28: 40 meq via ORAL
  Filled 2019-03-28: qty 2

## 2019-03-28 MED ORDER — MAGNESIUM OXIDE 400 (241.3 MG) MG PO TABS
800.0000 mg | ORAL_TABLET | Freq: Once | ORAL | Status: AC
  Administered 2019-03-28: 800 mg via ORAL
  Filled 2019-03-28: qty 2

## 2019-03-28 MED ORDER — FUROSEMIDE 10 MG/ML IJ SOLN
40.0000 mg | Freq: Once | INTRAMUSCULAR | Status: AC
Start: 2019-03-28 — End: 2019-03-28
  Administered 2019-03-28: 40 mg via INTRAVENOUS
  Filled 2019-03-28: qty 4

## 2019-03-28 MED ORDER — FUROSEMIDE 40 MG PO TABS: 40.0000 mg | ORAL_TABLET | Freq: Every day | ORAL | 0 refills | Status: AC

## 2019-03-28 MED ORDER — METHYLPHENIDATE HCL POWD
50.00 | Status: DC
Start: ? — End: 2019-03-28

## 2019-03-28 MED ORDER — POTASSIUM CHLORIDE 10 MEQ/100ML IV SOLN
10.0000 meq | Freq: Once | INTRAVENOUS | Status: AC
Start: 2019-03-28 — End: 2019-03-28
  Administered 2019-03-28: 18:00:00 10 meq via INTRAVENOUS
  Filled 2019-03-28: qty 100

## 2019-03-28 MED ORDER — POTASSIUM CHLORIDE CRYS ER 20 MEQ PO TBCR: 40.0000 meq | EXTENDED_RELEASE_TABLET | Freq: Every day | ORAL | 0 refills | Status: AC

## 2019-03-28 NOTE — ED Triage Notes (Addendum)
Pt states that he went to [facility] last night for sob and was told he was having a acute on chronic chf exacerbation and was given lasix IV ; pt states he was discharged and felt better but then the sob started again this morning ; pt states they didn't give him any lasix prescription to go home with; pt states he does have some chest pressure

## 2019-03-28 NOTE — ED Notes (Signed)
Dr. Tyrone Nine aware of 0.04 trop and 2.7 potassium

## 2019-03-28 NOTE — ED Notes (Signed)
Pt's dtr would like to be called once more information is available on pt's condition.

## 2019-03-28 NOTE — ED Provider Notes (Signed)
[facility] EMERGENCY DEPARTMENT Provider Note   CSN: [id number] Arrival date & time: 03/28/19  1455    History   Chief Complaint Chief Complaint  Patient presents with  . Shortness of Breath    HPI [name] is a 68 y.o. male.     68 yo M with a chief complaint of shortness of breath.  This is been going on for the past week or so.  Worse with lying back flat and having some episodes where he wakes up in the middle night panting for breath.  He went to [facility] and was admitted and then left AGAINST MEDICAL ADVICE because he felt that no one was taking care of him.  He does feel that his breathing is much better.  Has some chronic left-sided low back pain that is unchanged.  He denies cough congestion or fever.  The history is provided by the patient.  Shortness of Breath Severity:  Moderate Onset quality:  Gradual Duration:  2 weeks Timing:  Constant Progression:  Worsening Chronicity:  New Relieved by:  Nothing Worsened by:  Nothing Ineffective treatments:  None tried Associated symptoms: chest pain   Associated symptoms: no abdominal pain, no fever, no headaches, no rash and no vomiting     Past Medical History:  Diagnosis Date  . AAA (abdominal aortic aneurysm) (HCC)   . AICD (automatic cardioverter/defibrillator) present   . Alcohol abuse    hx of  . Allergic rhinitis   . Aortocoronary bypass status 1996   surgery  . Ataxia 11/21/2011  . Automatic implantable cardioverter-defibrillator in situ 06/27/2012  . Back pain   . Back pain, chronic   . Bipolar disorder, unspecified (HCC)   . Cellulitis of left foot hospitalized 12/17/2015  . Chest pain 11/21/2011  . Chronic combined systolic and diastolic congestive heart failure (HCC)   . Cocaine abuse (HCC)    hx of   . Combined systolic and diastolic congestive heart failure (HCC)   . COPD (chronic obstructive pulmonary disease) (HCC)   . Coronary artery disease    bypass grafting. Left internal mammary artery to left anterior descending   . Coronary atherosclerosis 06/08/2007   Qualifier: Diagnosis of  By: Audria Nine MD, Britta Mccreedy    . Defibrillator activation   . Degeneration of lumbar or lumbosacral intervertebral disc   . Displacement of intervertebral disc, site unspecified, without myelopathy 03/01/2012  . Dysarthria 11/21/2011  . Essential hypertension 06/08/2007   Qualifier: Diagnosis of  By: Audria Nine MD, Britta Mccreedy    . GERD (gastroesophageal reflux disease)   . Heart murmur   . History of gastrointestinal bleeding   . History of ventricular tachycardia     status post automatic implantable cardioverter defibrillator/pacer  . HYPERCHOLESTEROLEMIA 03/06/2009   Qualifier: Diagnosis of  By: Vikki Ports    . Hypersomnia, unspecified   . Hypertension   . Lumbar disc narrowing   . MI (myocardial infarction) [facility]) 1989 & 2006   with LV dysfunction  . Myocardial infarction (HCC)    hx of; sees [place]  . MYOCARDIAL INFARCTION, HX OF 06/08/2007   Qualifier: Diagnosis of  By: Audria Nine MD, Britta Mccreedy    . Overanxious disorder   . Pacemaker   . Periodic limb movement disorder   . Personal history of other diseases of digestive system   . Shortness of breath   . 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 Syncope  and collapse   . Tobacco use disorder   . Ventricular tachycardia (HCC) 04/07/2012  . Vertigo 11/21/2011    Patient Active Problem List   Diagnosis Date Noted  . Cellulitis of left foot 12/17/2015  . Combined systolic and diastolic congestive heart failure (HCC)   . Back pain   . Chronic combined systolic and diastolic congestive heart failure (HCC)   . COPD (chronic obstructive pulmonary disease) (HCC)   . Automatic implantable cardioverter-defibrillator in situ 06/27/2012  . Ventricular tachycardia (HCC) 04/07/2012  . Displacement of intervertebral disc, site unspecified, without myelopathy  03/01/2012  . Ataxia 11/21/2011  . Dysarthria 11/21/2011  . Vertigo 11/21/2011  . Chest pain 11/21/2011  . HYPERCHOLESTEROLEMIA 03/06/2009  . BIPOLAR DISORDER UNSPECIFIED 03/06/2009  . Backache 03/06/2009  . Periodic limb movement disorder 02/28/2008  . HYPERSOMNIA 02/15/2008  . TOBACCO ABUSE 06/15/2007  . Essential hypertension 06/08/2007  . MYOCARDIAL INFARCTION, HX OF 06/08/2007  . Coronary atherosclerosis 06/08/2007  . 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 BACK SURGERY    . BI-VENTRICULAR IMPLANTABLE CARDIOVERTER DEFIBRILLATOR UPGRADE  [facility]   [facility] & defibrill[facility]"  . CARDIAC CATHETERIZATION  05/14/2006   Est EF of over 50%  . CARDIAC CATHETERIZATION     Est EF of 50% --   . CARDIAC CATHETERIZATION  09/03/2008   Continued patency of the internal mammary to the LAD -- of the saphenous vein graft to the diagonal -- of the saphenous vein graft to PDA -- Occluded saphenous vein graft to the circumflex with essentially widely patent circumflex vessel  . CARDIAC DEFIBRILLATOR PLACEMENT  10/23/2007    Medtronic Maximo single chamber cardioverter-defibrillator  . CERVICAL FUSION    . [place]ONARY ARTERY [place]RA[place]  [place] LIMA to LAD, SVG to diagonal, SVG to RCA, SVG to circumflex (saphenous vein graft to circumflex totaled at catheterization in 2006)  . LUMBAR FUSION     L5-S1 transforaminal lumbar interbody fusion with contralateral posterolateral fusion at the same level -- Posterolateral fusion T10-11, T11-12, T12-L1, L1-L2, L2-L3, L3-L4, L4-L5 --   . LUMBAR LAMINECTOMY/DECOMPRESSION MICRODISCECTOMY  11/26/2010   Laminectomy with decompression of the spinal cord C3-4, C4-5, C5-6,C6-7        Home Medications    Prior to Admission medications   Medication Sig Start Date End Date Taking? Authorizing Provider  albuterol (PROVENTIL) (2.5 MG/3ML)  0.083% nebulizer solution Take 2.5 mg by nebulization 2 (two) times daily as needed for wheezing or shortness of breath. For shortness of breath.    [provider]  aspirin 81 MG chewable tablet Chew 81 mg by mouth daily.    [provider]  atorvastatin (LIPITOR) 80 MG tablet Take 80 mg by mouth daily.    [provider]  carvedilol (COREG) 3.125 MG tablet Take 1 tablet (3.125 mg total) by mouth 2 (two) times daily with a meal. 12/21/15   Albertine GratesXu, Fang, MD  clopidogrel (PLAVIX) 75 MG tablet Take 75 mg by mouth daily.    [provider]  divalproex (DEPAKOTE) 500 MG DR tablet Take 500 mg by mouth 2 (two) times daily.    [provider]  doxycycline (VIBRAMYCIN) 100 MG capsule Take 1 capsule (100 mg total) by mouth 2 (two) times daily. 12/21/15   Albertine GratesXu, Fang, MD  escitalopram (LEXAPRO) 5 MG tablet Take 15 mg by mouth every evening.    [provider]  furosemide (LASIX) 40 MG tablet Take 1 tablet (40 mg total) by mouth daily. 03/28/19   Melene PlanFloyd, Sibbie Flammia, DO  gabapentin (NEURONTIN) 600 MG tablet Take 600 mg by mouth 3 (three) times daily.    [provider]  hydrochlorothiazide (HYDRODIURIL) 25 MG tablet Take 25 mg by mouth daily.    [provider]  hydrOXYzine (VISTARIL) 25 MG capsule Take 25 mg by mouth every evening.    [provider]  ipratropium-albuterol (DUONEB) 0.5-2.5 (3) MG/3ML SOLN Take 3 mLs by nebulization 2 (two) times daily as needed (shortness of breath).    [provider]  lisinopril (ZESTRIL) 10 MG tablet Take 2 tablets (20 mg total) by mouth daily. 12/21/15   Albertine GratesXu, Fang, MD  loratadine (CLARITIN) 10 MG tablet Take 10 mg by mouth daily.    [provider]  magnesium oxide (MAG-OX) 400 (241.3 Mg) MG tablet Take 400 mg by mouth daily.    [provider]  meclizine (ANTIVERT) 25 MG tablet Take 25 mg by mouth 3 (three) times daily as needed for dizziness.    [provider]   oxyCODONE-acetaminophen (PERCOCET/ROXICET) 5-325 MG tablet Take 1 tablet by mouth every 6 (six) hours as needed for severe pain. 12/21/15   Albertine GratesXu, Fang, MD  pantoprazole (PROTONIX) 40 MG tablet Take 40 mg by mouth daily.    [provider]  potassium chloride SA (K-DUR) 20 MEQ tablet Take 2 tablets (40 mEq total) by mouth daily for 5 days. 03/28/19 04/02/19  Melene PlanFloyd, Glenetta Kiger, DO  traMADol (ULTRAM) 50 MG tablet Take 50 mg by mouth 3 (three) times daily as needed for moderate pain.    [provider]    Family History Family History  Problem Relation Age of Onset  . Heart disease Father 2570       deceased  . Heart attack Father   . Hypertension Father   . Heart disease Mother 9860       deceased  . Heart attack Mother   . Hypertension Mother   . Diabetes Mother   . Heart disease Brother        alive  . GI problems Sister        bleed  . Stroke Other     Social History Social History   Tobacco Use  . Smoking status: Former Smoker    Packs/day: 1.00    Years: 40.00    Pack years: 40.00    Types: Cigarettes  . Smokeless tobacco: Never Used  . Tobacco comment: "quit smoking in 2012"  Substance Use Topics  . Alcohol use: Yes    Comment: "quit drinking <~2012"  . Drug use: Yes    Types: Cocaine    Comment: He last tested positive for THC in 10-2007. Last tested positive for cocaine in 2007     Allergies   Amitriptyline hcl, Codeine, Iohexol, Ivp dye [iodinated diagnostic agents], and Nifedipine   Review of Systems Review of Systems  Constitutional: Negative for chills and fever.  HENT: Negative for congestion and facial swelling.   Eyes: Negative for discharge and visual disturbance.  Respiratory: Positive for shortness of breath.   Cardiovascular: Positive for chest pain. Negative for palpitations.  Gastrointestinal: Negative for abdominal pain, diarrhea and vomiting.  Musculoskeletal: Negative for arthralgias and myalgias.  Skin: Negative for color change and  rash.  Neurological: Negative for tremors, syncope and headaches.  Psychiatric/Behavioral: Negative for confusion and dysphoric mood.     Physical Exam Updated Vital  Signs BP 133/78   Pulse 78   Temp 98.3 F (36.8 C) (Oral)   Resp 17   SpO2 98%   Physical Exam Vitals signs and nursing note reviewed.  Constitutional:      Appearance: He is well-developed.  HENT:     Head: Normocephalic and atraumatic.  Eyes:     Pupils: Pupils are equal, round, and reactive to light.  Neck:     Musculoskeletal: Normal range of motion and neck supple.     Vascular: No JVD.  Cardiovascular:     Rate and Rhythm: Normal rate and regular rhythm.     Heart sounds: No murmur. No friction rub. No gallop.   Pulmonary:     Effort: No respiratory distress.     Breath sounds: No wheezing.  Abdominal:     General: There is no distension.     Tenderness: There is no guarding or rebound.  Musculoskeletal: Normal range of motion.  Skin:    Coloration: Skin is not pale.     Findings: No rash.  Neurological:     Mental Status: He is alert and oriented to person, place, and time.  Psychiatric:        Behavior: Behavior normal.      ED Treatments / Results  Labs (all labs ordered are listed, but only abnormal results are displayed) Labs Reviewed  CBC WITH DIFFERENTIAL/PLATELET - Abnormal; Notable for the following components:      Result Value   RBC 3.74 (*)    Hemoglobin 11.4 (*)    HCT 34.6 (*)    All other components within normal limits  BASIC METABOLIC PANEL - Abnormal; Notable for the following components:   Potassium 2.7 (*)    All other components within normal limits  TROPONIN I - Abnormal; Notable for the following components:   Troponin I 0.04 (*)    All other components within normal limits  BRAIN NATRIURETIC PEPTIDE - Abnormal; Notable for the following components:   B Natriuretic Peptide 1,215.6 (*)    All other components within normal limits  TROPONIN I - Abnormal; Notable  for the following components:   Troponin I 0.04 (*)    All other components within normal limits  POCT I-STAT EG7 - Abnormal; Notable for the following components:   pH, Ven 7.477 (*)    pCO2, Ven 32.0 (*)    pO2, Ven 116.0 (*)    Potassium 2.8 (*)    HCT 34.0 (*)    Hemoglobin 11.6 (*)    All other components within normal limits    EKG EKG Interpretation  Date/Time:  Wednesday March 28 2019 15:07:02 EDT Ventricular Rate:  71 PR Interval:    QRS Duration: 178 QT Interval:  490 QTC Calculation: 533 R Axis:   98 Text Interpretation:  Age not entered, assumed to be  68 years old for purpose of ECG interpretation Sinus rhythm Multiform ventricular premature complexes Prolonged PR interval Probable left atrial enlargement RBBB and LPFB widened qrs Confirmed by Melene PlanFloyd, Vannak Montenegro [phone]([id number]) on 03/28/2019 4:19:22 PM   Radiology Dg Chest Port 1 View  Result Date: 03/28/2019 CLINICAL DATA:  Shortness of breath. EXAM: PORTABLE CHEST 1 VIEW COMPARISON:  Chest x-ray 03/27/2019. FINDINGS: Cardiac pacer with lead tip over the right ventricle. Prior CABG. Cardiomegaly with mild pulmonary venous congestion. No focal infiltrate. No pleural effusion or pneumothorax. Prior cervical spine fusion. IMPRESSION: Cardiac pacer noted with lead tip over the right ventricle. Prior CABG. Cardiomegaly. Mild pulmonary venous congestion.  No overt pulmonary edema. Electronically Signed   By: Marcello Moores  Register   On: 03/28/2019 16:37    Procedures Procedures (including critical care time)  Medications Ordered in ED Medications  furosemide (LASIX) injection 40 mg (40 mg Intravenous Given 03/28/19 1728)  potassium chloride SA (K-DUR) CR tablet 40 mEq (40 mEq Oral Given 03/28/19 1728)  magnesium oxide (MAG-OX) tablet 800 mg (800 mg Oral Given 03/28/19 1728)  potassium chloride 10 mEq in 100 mL IVPB (0 mEq Intravenous Stopped 03/28/19 1831)     Initial Impression / Assessment and Plan / ED Course  I have reviewed the triage  vital signs and the nursing notes.  Pertinent labs & imaging results that were available during my care of the patient were reviewed by me and considered in my medical decision making (see chart for details).        68 yo M with a chief complaints of shortness of breath.  Sounds like a CHF exacerbation with orthopnea and PND.  No fluid on my exam, no JVD no rales no peripheral edema.  He was just in the hospital and diuresed and left [place] yesterday.  He is well-appearing and nontoxic has satting on her percent on room air initially.  Talking complete sentences without difficulty.  Given a dose of Lasix here with some improvement.  Potassium has been lowest was documented at his prior hospitalization.  Replenished here.  He is requesting a cardiologist in our system.  PCP and cardiology follow-up.  7:45 PM:  I have discussed the diagnosis/risks/treatment options with the patient and believe the pt to be eligible for discharge home to follow-up with PCP, cards. We also discussed returning to the ED immediately if new or worsening sx occur. We discussed the sx which are most concerning (e.g., sudden worsening sob, fever, inability to tolerate by mouth) that necessitate immediate return. Medications administered to the patient during their visit and any new prescriptions provided to the patient are listed below.  Medications given during this visit Medications  furosemide (LASIX) injection 40 mg (40 mg Intravenous Given 03/28/19 1728)  potassium chloride SA (K-DUR) CR tablet 40 mEq (40 mEq Oral Given 03/28/19 1728)  magnesium oxide (MAG-OX) tablet 800 mg (800 mg Oral Given 03/28/19 1728)  potassium chloride 10 mEq in 100 mL IVPB (0 mEq Intravenous Stopped 03/28/19 1831)     The patient appears reasonably screen and/or stabilized for discharge and I doubt any other medical condition or other [facility] requiring further screening, evaluation, or treatment in the ED at this time prior to  discharge.    Final Clinical Impressions(s) / ED Diagnoses   Final diagnoses:  Acute on chronic combined systolic and diastolic congestive heart failure [facility])    ED Discharge Orders         Ordered    furosemide (LASIX) 40 MG tablet  Daily     03/28/19 1942    potassium chloride SA (K-DUR) 20 MEQ tablet  Daily     03/28/19 1942           Deno Etienne, DO 03/28/19 1945

## 2019-03-28 NOTE — Discharge Instructions (Signed)
Please follow-up with your family doctor and a cardiologist.  If you would like to use a cardiologist in your system I have given you the name and phone number and address of the cardiologist that is on-call this evening.

## 2019-04-02 DIAGNOSIS — Z8659 Personal history of other mental and behavioral disorders: Secondary | ICD-10-CM | POA: Diagnosis not present

## 2019-04-02 DIAGNOSIS — F1011 Alcohol abuse, in remission: Secondary | ICD-10-CM | POA: Diagnosis not present

## 2019-04-02 DIAGNOSIS — F314 Bipolar disorder, current episode depressed, severe, without psychotic features: Secondary | ICD-10-CM | POA: Diagnosis not present

## 2019-04-02 DIAGNOSIS — F419 Anxiety disorder, unspecified: Secondary | ICD-10-CM | POA: Diagnosis not present

## 2019-04-02 DIAGNOSIS — Z87828 Personal history of other (healed) physical injury and trauma: Secondary | ICD-10-CM | POA: Diagnosis not present

## 2019-04-02 DIAGNOSIS — F1411 Cocaine abuse, in remission: Secondary | ICD-10-CM | POA: Diagnosis not present

## 2019-04-17 DIAGNOSIS — F419 Anxiety disorder, unspecified: Secondary | ICD-10-CM | POA: Diagnosis not present

## 2019-04-17 DIAGNOSIS — Z8659 Personal history of other mental and behavioral disorders: Secondary | ICD-10-CM | POA: Diagnosis not present

## 2019-04-17 DIAGNOSIS — F1011 Alcohol abuse, in remission: Secondary | ICD-10-CM | POA: Diagnosis not present

## 2019-04-17 DIAGNOSIS — Z87828 Personal history of other (healed) physical injury and trauma: Secondary | ICD-10-CM | POA: Diagnosis not present

## 2019-04-17 DIAGNOSIS — F1411 Cocaine abuse, in remission: Secondary | ICD-10-CM | POA: Diagnosis not present

## 2019-04-17 DIAGNOSIS — F314 Bipolar disorder, current episode depressed, severe, without psychotic features: Secondary | ICD-10-CM | POA: Diagnosis not present

## 2019-04-24 ENCOUNTER — Telehealth: Payer: Self-pay | Admitting: *Deleted

## 2019-04-24 ENCOUNTER — Encounter: Payer: Self-pay | Admitting: *Deleted

## 2019-04-24 ENCOUNTER — Ambulatory Visit: Payer: Self-pay | Admitting: Neurology

## 2019-04-24 NOTE — Telephone Encounter (Signed)
No showed new patient appointment. 

## 2019-05-07 DIAGNOSIS — Z8659 Personal history of other mental and behavioral disorders: Secondary | ICD-10-CM | POA: Diagnosis not present

## 2019-05-07 DIAGNOSIS — F1011 Alcohol abuse, in remission: Secondary | ICD-10-CM | POA: Diagnosis not present

## 2019-05-07 DIAGNOSIS — F419 Anxiety disorder, unspecified: Secondary | ICD-10-CM | POA: Diagnosis not present

## 2019-05-07 DIAGNOSIS — Z87828 Personal history of other (healed) physical injury and trauma: Secondary | ICD-10-CM | POA: Diagnosis not present

## 2019-05-07 DIAGNOSIS — F1411 Cocaine abuse, in remission: Secondary | ICD-10-CM | POA: Diagnosis not present

## 2019-05-07 DIAGNOSIS — G894 Chronic pain syndrome: Secondary | ICD-10-CM | POA: Diagnosis not present

## 2019-05-07 DIAGNOSIS — F314 Bipolar disorder, current episode depressed, severe, without psychotic features: Secondary | ICD-10-CM | POA: Diagnosis not present

## 2019-06-10 DIAGNOSIS — Z20828 Contact with and (suspected) exposure to other viral communicable diseases: Secondary | ICD-10-CM | POA: Diagnosis not present

## 2019-06-12 DIAGNOSIS — F1411 Cocaine abuse, in remission: Secondary | ICD-10-CM | POA: Diagnosis not present

## 2019-06-12 DIAGNOSIS — Z23 Encounter for immunization: Secondary | ICD-10-CM | POA: Diagnosis not present

## 2019-06-12 DIAGNOSIS — Z8659 Personal history of other mental and behavioral disorders: Secondary | ICD-10-CM | POA: Diagnosis not present

## 2019-06-12 DIAGNOSIS — F419 Anxiety disorder, unspecified: Secondary | ICD-10-CM | POA: Diagnosis not present

## 2019-06-12 DIAGNOSIS — F1011 Alcohol abuse, in remission: Secondary | ICD-10-CM | POA: Diagnosis not present

## 2019-06-12 DIAGNOSIS — Z87828 Personal history of other (healed) physical injury and trauma: Secondary | ICD-10-CM | POA: Diagnosis not present

## 2019-06-12 DIAGNOSIS — F314 Bipolar disorder, current episode depressed, severe, without psychotic features: Secondary | ICD-10-CM | POA: Diagnosis not present

## 2019-06-25 DIAGNOSIS — S32009K Unspecified fracture of unspecified lumbar vertebra, subsequent encounter for fracture with nonunion: Secondary | ICD-10-CM | POA: Diagnosis not present

## 2019-06-25 DIAGNOSIS — M961 Postlaminectomy syndrome, not elsewhere classified: Secondary | ICD-10-CM | POA: Diagnosis not present

## 2019-06-25 DIAGNOSIS — M4714 Other spondylosis with myelopathy, thoracic region: Secondary | ICD-10-CM | POA: Diagnosis not present

## 2019-06-25 DIAGNOSIS — Z0289 Encounter for other administrative examinations: Secondary | ICD-10-CM | POA: Diagnosis not present

## 2019-06-25 DIAGNOSIS — G894 Chronic pain syndrome: Secondary | ICD-10-CM | POA: Diagnosis not present

## 2019-07-02 DIAGNOSIS — F314 Bipolar disorder, current episode depressed, severe, without psychotic features: Secondary | ICD-10-CM | POA: Diagnosis not present

## 2019-07-02 DIAGNOSIS — F419 Anxiety disorder, unspecified: Secondary | ICD-10-CM | POA: Diagnosis not present

## 2019-07-02 DIAGNOSIS — F1011 Alcohol abuse, in remission: Secondary | ICD-10-CM | POA: Diagnosis not present

## 2019-07-02 DIAGNOSIS — Z87828 Personal history of other (healed) physical injury and trauma: Secondary | ICD-10-CM | POA: Diagnosis not present

## 2019-07-02 DIAGNOSIS — Z8659 Personal history of other mental and behavioral disorders: Secondary | ICD-10-CM | POA: Diagnosis not present

## 2019-07-02 DIAGNOSIS — F1411 Cocaine abuse, in remission: Secondary | ICD-10-CM | POA: Diagnosis not present

## 2019-07-17 DIAGNOSIS — R350 Frequency of micturition: Secondary | ICD-10-CM | POA: Diagnosis not present

## 2019-07-17 DIAGNOSIS — N401 Enlarged prostate with lower urinary tract symptoms: Secondary | ICD-10-CM | POA: Diagnosis not present

## 2019-07-17 DIAGNOSIS — J449 Chronic obstructive pulmonary disease, unspecified: Secondary | ICD-10-CM | POA: Diagnosis not present

## 2019-07-17 DIAGNOSIS — F319 Bipolar disorder, unspecified: Secondary | ICD-10-CM | POA: Diagnosis not present

## 2019-07-17 DIAGNOSIS — I25709 Atherosclerosis of coronary artery bypass graft(s), unspecified, with unspecified angina pectoris: Secondary | ICD-10-CM | POA: Diagnosis not present

## 2019-07-17 DIAGNOSIS — I5043 Acute on chronic combined systolic (congestive) and diastolic (congestive) heart failure: Secondary | ICD-10-CM | POA: Diagnosis not present

## 2019-07-17 DIAGNOSIS — I472 Ventricular tachycardia: Secondary | ICD-10-CM | POA: Diagnosis not present

## 2019-07-17 DIAGNOSIS — I10 Essential (primary) hypertension: Secondary | ICD-10-CM | POA: Diagnosis not present

## 2019-07-20 DIAGNOSIS — F39 Unspecified mood [affective] disorder: Secondary | ICD-10-CM | POA: Diagnosis not present

## 2019-07-20 DIAGNOSIS — G4700 Insomnia, unspecified: Secondary | ICD-10-CM | POA: Diagnosis not present

## 2019-07-23 DIAGNOSIS — M4714 Other spondylosis with myelopathy, thoracic region: Secondary | ICD-10-CM | POA: Diagnosis not present

## 2019-07-23 DIAGNOSIS — G894 Chronic pain syndrome: Secondary | ICD-10-CM | POA: Diagnosis not present

## 2019-08-06 DIAGNOSIS — M47814 Spondylosis without myelopathy or radiculopathy, thoracic region: Secondary | ICD-10-CM | POA: Diagnosis not present

## 2019-08-06 DIAGNOSIS — R42 Dizziness and giddiness: Secondary | ICD-10-CM | POA: Diagnosis not present

## 2019-08-06 DIAGNOSIS — Y998 Other external cause status: Secondary | ICD-10-CM | POA: Diagnosis not present

## 2019-08-06 DIAGNOSIS — M546 Pain in thoracic spine: Secondary | ICD-10-CM | POA: Diagnosis not present

## 2019-08-06 DIAGNOSIS — M545 Low back pain: Secondary | ICD-10-CM | POA: Diagnosis not present

## 2019-08-06 DIAGNOSIS — W19XXXA Unspecified fall, initial encounter: Secondary | ICD-10-CM | POA: Diagnosis not present

## 2019-08-06 DIAGNOSIS — S39012A Strain of muscle, fascia and tendon of lower back, initial encounter: Secondary | ICD-10-CM | POA: Diagnosis not present

## 2019-08-06 DIAGNOSIS — M8588 Other specified disorders of bone density and structure, other site: Secondary | ICD-10-CM | POA: Diagnosis not present

## 2019-08-06 DIAGNOSIS — M47816 Spondylosis without myelopathy or radiculopathy, lumbar region: Secondary | ICD-10-CM | POA: Diagnosis not present

## 2019-08-06 DIAGNOSIS — R1030 Lower abdominal pain, unspecified: Secondary | ICD-10-CM | POA: Diagnosis not present

## 2019-09-13 DIAGNOSIS — M961 Postlaminectomy syndrome, not elsewhere classified: Secondary | ICD-10-CM | POA: Diagnosis not present

## 2019-09-13 DIAGNOSIS — G894 Chronic pain syndrome: Secondary | ICD-10-CM | POA: Diagnosis not present

## 2019-09-13 DIAGNOSIS — M5136 Other intervertebral disc degeneration, lumbar region: Secondary | ICD-10-CM | POA: Diagnosis not present

## 2019-09-13 DIAGNOSIS — M4714 Other spondylosis with myelopathy, thoracic region: Secondary | ICD-10-CM | POA: Diagnosis not present

## 2019-09-14 ENCOUNTER — Emergency Department (HOSPITAL_COMMUNITY): Payer: Medicare Other

## 2019-09-14 ENCOUNTER — Encounter (HOSPITAL_COMMUNITY): Payer: Self-pay | Admitting: Emergency Medicine

## 2019-09-14 ENCOUNTER — Other Ambulatory Visit: Payer: Self-pay

## 2019-09-14 ENCOUNTER — Emergency Department (HOSPITAL_COMMUNITY)
Admission: EM | Admit: 2019-09-14 | Discharge: 2019-09-14 | Disposition: A | Discharge status: Home / Self Care | Payer: Medicare Other | Attending: Emergency Medicine | Admitting: Emergency Medicine

## 2019-09-14 DIAGNOSIS — S79912A Unspecified injury of left hip, initial encounter: Secondary | ICD-10-CM | POA: Diagnosis not present

## 2019-09-14 DIAGNOSIS — G8929 Other chronic pain: Secondary | ICD-10-CM

## 2019-09-14 DIAGNOSIS — Y9389 Activity, other specified: Secondary | ICD-10-CM | POA: Diagnosis not present

## 2019-09-14 DIAGNOSIS — R1032 Left lower quadrant pain: Secondary | ICD-10-CM | POA: Diagnosis not present

## 2019-09-14 DIAGNOSIS — Y998 Other external cause status: Secondary | ICD-10-CM | POA: Insufficient documentation

## 2019-09-14 DIAGNOSIS — I4510 Unspecified right bundle-branch block: Secondary | ICD-10-CM | POA: Diagnosis not present

## 2019-09-14 DIAGNOSIS — Y9289 Other specified places as the place of occurrence of the external cause: Secondary | ICD-10-CM | POA: Insufficient documentation

## 2019-09-14 DIAGNOSIS — Z79899 Other long term (current) drug therapy: Secondary | ICD-10-CM | POA: Diagnosis not present

## 2019-09-14 DIAGNOSIS — W19XXXA Unspecified fall, initial encounter: Secondary | ICD-10-CM | POA: Insufficient documentation

## 2019-09-14 DIAGNOSIS — I252 Old myocardial infarction: Secondary | ICD-10-CM | POA: Insufficient documentation

## 2019-09-14 DIAGNOSIS — J449 Chronic obstructive pulmonary disease, unspecified: Secondary | ICD-10-CM | POA: Insufficient documentation

## 2019-09-14 DIAGNOSIS — Z87891 Personal history of nicotine dependence: Secondary | ICD-10-CM | POA: Insufficient documentation

## 2019-09-14 DIAGNOSIS — I110 Hypertensive heart disease with heart failure: Secondary | ICD-10-CM | POA: Insufficient documentation

## 2019-09-14 DIAGNOSIS — Z7982 Long term (current) use of aspirin: Secondary | ICD-10-CM | POA: Insufficient documentation

## 2019-09-14 DIAGNOSIS — R1030 Lower abdominal pain, unspecified: Secondary | ICD-10-CM | POA: Diagnosis not present

## 2019-09-14 DIAGNOSIS — M5442 Lumbago with sciatica, left side: Secondary | ICD-10-CM | POA: Insufficient documentation

## 2019-09-14 DIAGNOSIS — I5042 Chronic combined systolic (congestive) and diastolic (congestive) heart failure: Secondary | ICD-10-CM | POA: Insufficient documentation

## 2019-09-14 DIAGNOSIS — M25552 Pain in left hip: Secondary | ICD-10-CM | POA: Diagnosis not present

## 2019-09-14 DIAGNOSIS — M79652 Pain in left thigh: Secondary | ICD-10-CM | POA: Diagnosis not present

## 2019-09-14 DIAGNOSIS — I10 Essential (primary) hypertension: Secondary | ICD-10-CM | POA: Diagnosis not present

## 2019-09-14 LAB — CBC WITH DIFFERENTIAL/PLATELET
Abs Immature Granulocytes: 0.03 10*3/uL (ref 0.00–0.07)
Basophils Absolute: 0 10*3/uL (ref 0.0–0.1)
Basophils Relative: 0 %
Eosinophils Absolute: 0.3 10*3/uL (ref 0.0–0.5)
Eosinophils Relative: 2 %
HCT: 34.6 % — ABNORMAL LOW (ref 39.0–52.0)
Hemoglobin: 11.4 g/dL — ABNORMAL LOW (ref 13.0–17.0)
Immature Granulocytes: 0 %
Lymphocytes Relative: 17 %
Lymphs Abs: 1.7 10*3/uL (ref 0.7–4.0)
MCH: 30.9 pg (ref 26.0–34.0)
MCHC: 32.9 g/dL (ref 30.0–36.0)
MCV: 93.8 fL (ref 80.0–100.0)
Monocytes Absolute: 0.5 10*3/uL (ref 0.1–1.0)
Monocytes Relative: 5 %
Neutro Abs: 7.7 10*3/uL (ref 1.7–7.7)
Neutrophils Relative %: 76 %
Platelets: 187 10*3/uL (ref 150–400)
RBC: 3.69 MIL/uL — ABNORMAL LOW (ref 4.22–5.81)
RDW: 13.3 % (ref 11.5–15.5)
WBC: 10.3 10*3/uL (ref 4.0–10.5)
nRBC: 0 % (ref 0.0–0.2)

## 2019-09-14 LAB — BASIC METABOLIC PANEL
Anion gap: 8 (ref 5–15)
BUN: 16 mg/dL (ref 8–23)
CO2: 23 mmol/L (ref 22–32)
Calcium: 8.8 mg/dL — ABNORMAL LOW (ref 8.9–10.3)
Chloride: 108 mmol/L (ref 98–111)
Creatinine, Ser: 0.89 mg/dL (ref 0.61–1.24)
GFR calc Af Amer: >60 mL/min (ref >60)
GFR calc non Af Amer: >60 mL/min (ref >60)
Glucose, Bld: 111 mg/dL — ABNORMAL HIGH (ref 70–99)
Potassium: 3.9 mmol/L (ref 3.5–5.1)
Sodium: 139 mmol/L (ref 135–145)

## 2019-09-14 MED ORDER — DIAZEPAM 5 MG PO TABS
5.0000 mg | ORAL_TABLET | Freq: Once | ORAL | Status: AC
  Administered 2019-09-14: 5 mg via ORAL
  Filled 2019-09-14: qty 1

## 2019-09-14 MED ORDER — HYDROMORPHONE HCL 1 MG/ML IJ SOLN
1.0000 mg | Freq: Once | INTRAMUSCULAR | Status: AC
  Administered 2019-09-14: 06:00:00 1 mg via INTRAMUSCULAR
  Filled 2019-09-14: qty 1

## 2019-09-14 NOTE — ED Notes (Addendum)
Patient states he fell several days ago , woke up yest am  With groin pain. C/o increased urination , States he is on chronic pain medication however he was moving and his box with his medication came off his truck , his MD in [place] told him to come to [facility].

## 2019-09-14 NOTE — ED Triage Notes (Signed)
Patient tripped and fell yesterday reports left groin pain radiating to posterior left high, denies LOC .

## 2019-09-14 NOTE — ED Provider Notes (Signed)
[facility] EMERGENCY DEPARTMENT Provider Note   CSN: [id number] Arrival date & time: 09/14/19  0130     History   Chief Complaint Chief Complaint  Patient presents with  . Fall    HPI [name] is a 68 y.o. male.     Patient to ED with pain in his left groin since fall 2 days ago. He was loading his pick up truck and fell backwards onto the ground. Since then he has uncontrolled pain through his left groin from the penile base to the perineum. No difficulty urinating or hematuria. No swelling of the scrotum. He also reports uncontrolled chronic back and left leg pain with spasm in his left hamstring. He has had multiple back surgeries and reports he has been told surgery is no longer an option for him. He is medically managed at [facility] with oxycodone and buprenorphine patch. He states that last week during a move, his medication case fell off the truck and scattered his medications so he has no oxycodone.   The history is provided by the patient. No language interpreter was used.  Fall    Past Medical History:  Diagnosis Date  . AAA (abdominal aortic aneurysm) (HCC)   . AICD (automatic cardioverter/defibrillator) present   . Alcohol abuse    hx of  . Allergic rhinitis   . Aortocoronary bypass status 1996   surgery  . Ataxia 11/21/2011  . Automatic implantable cardioverter-defibrillator in situ 06/27/2012  . Back pain   . Back pain, chronic   . Bipolar disorder, unspecified (HCC)   . Cellulitis of left foot hospitalized 12/17/2015  . Chest pain 11/21/2011  . Chronic combined systolic and diastolic congestive heart failure (HCC)   . Cocaine abuse (HCC)    hx of   . Combined systolic and diastolic congestive heart failure (HCC)   . COPD (chronic obstructive pulmonary disease) (HCC)   . Coronary artery disease    bypass grafting. Left internal mammary artery to left anterior descending   . Coronary atherosclerosis 06/08/2007   Qualifier:  Diagnosis of  By: Audria Nine MD, Britta Mccreedy    . Defibrillator activation   . Degeneration of lumbar or lumbosacral intervertebral disc   . Displacement of intervertebral disc, site unspecified, without myelopathy 03/01/2012  . Dysarthria 11/21/2011  . Essential hypertension 06/08/2007   Qualifier: Diagnosis of  By: Audria Nine MD, Britta Mccreedy    . GERD (gastroesophageal reflux disease)   . Heart murmur   . History of gastrointestinal bleeding   . History of ventricular tachycardia     status post automatic implantable cardioverter defibrillator/pacer  . HYPERCHOLESTEROLEMIA 03/06/2009   Qualifier: Diagnosis of  By: Vikki Ports    . Hypersomnia, unspecified   . Hypertension   . Lumbar disc narrowing   . MI (myocardial infarction) [facility]) 1989 & 2006   with LV dysfunction  . Myocardial infarction (HCC)    hx of; sees [place]  . MYOCARDIAL INFARCTION, HX OF 06/08/2007   Qualifier: Diagnosis of  By: Audria Nine MD, Britta Mccreedy    . Overanxious disorder   . Pacemaker   . Periodic limb movement disorder   . Personal history of other diseases of digestive system   . Seizure (HCC)   . Shortness of breath   . 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 Syncope and collapse   . Tobacco use disorder   . Ventricular tachycardia (HCC) 04/07/2012  . Vertigo 11/21/2011  Patient Active Problem List   Diagnosis Date Noted  . Cellulitis of left foot 12/17/2015  . Combined systolic and diastolic congestive heart failure (HCC)   . Back pain   . Chronic combined systolic and diastolic congestive heart failure (HCC)   . COPD (chronic obstructive pulmonary disease) (HCC)   . Automatic implantable cardioverter-defibrillator in situ 06/27/2012  . Ventricular tachycardia (HCC) 04/07/2012  . Displacement of intervertebral disc, site unspecified, without myelopathy 03/01/2012  . Ataxia 11/21/2011  . Dysarthria 11/21/2011  . Vertigo 11/21/2011  . Chest pain 11/21/2011   . HYPERCHOLESTEROLEMIA 03/06/2009  . BIPOLAR DISORDER UNSPECIFIED 03/06/2009  . Backache 03/06/2009  . Periodic limb movement disorder 02/28/2008  . HYPERSOMNIA 02/15/2008  . TOBACCO ABUSE 06/15/2007  . Essential hypertension 06/08/2007  . MYOCARDIAL INFARCTION, HX OF 06/08/2007  . Coronary atherosclerosis 06/08/2007  . 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 BACK SURGERY    . BI-VENTRICULAR IMPLANTABLE CARDIOVERTER DEFIBRILLATOR UPGRADE  [facility]"  . CARDIAC CATHETERIZATION  05/14/2006   Est EF of over 50%  . CARDIAC CATHETERIZATION     Est EF of 50% --   . CARDIAC CATHETERIZATION  09/03/2008   Continued patency of the internal mammary to the LAD -- of the saphenous vein graft to the diagonal -- of the saphenous vein graft to PDA -- Occluded saphenous vein graft to the circumflex with essentially widely patent circumflex vessel  . CARDIAC DEFIBRILLATOR PLACEMENT  10/23/2007    Medtronic Maximo single chamber cardioverter-defibrillator  . CERVICAL FUSION    . [place]ONARY ARTERY [place]PASS GRAFT  [place]  -- LIMA to LAD, SVG to diagonal, SVG to RCA, SVG to circumflex (saphenous vein graft to circumflex totaled at catheterization in 2006)  . LUMBAR FUSION     L5-S1 transforaminal lumbar interbody fusion with contralateral posterolateral fusion at the same level -- Posterolateral fusion T10-11, T11-12, T12-L1, L1-L2, L2-L3, L3-L4, L4-L5 --   . LUMBAR LAMINECTOMY/DECOMPRESSION MICRODISCECTOMY  11/26/2010   Laminectomy with decompression of the spinal cord C3-4, C4-5, C5-6,C6-7        Home Medications    Prior to Admission medications   Medication Sig Start Date End Date Taking? Authorizing Provider  albuterol (PROVENTIL) (2.5 MG/3ML) 0.083% nebulizer solution Take 2.5 mg by nebulization 2 (two) times daily as needed for wheezing or shortness of  breath. For shortness of breath.    [provider]  aspirin 81 MG chewable tablet Chew 81 mg by mouth daily.    [provider]  atorvastatin (LIPITOR) 80 MG tablet Take 80 mg by mouth daily.    [provider]  carvedilol (COREG) 3.125 MG tablet Take 1 tablet (3.125 mg total) by mouth 2 (two) times daily with a meal. 12/21/15   Albertine GratesXu, Fang, MD  clopidogrel (PLAVIX) 75 MG tablet Take 75 mg by mouth daily.    [provider]  divalproex (DEPAKOTE) 500 MG DR tablet Take 500 mg by mouth 2 (two) times daily.    [provider]  doxycycline (VIBRAMYCIN) 100 MG capsule Take 1 capsule (100 mg total) by mouth 2 (two) times daily. 12/21/15   Albertine GratesXu, Fang, MD  escitalopram (LEXAPRO) 5 MG tablet Take 15 mg by mouth every evening.    [provider]  furosemide (LASIX) 40 MG tablet Take 1 tablet (40 mg total) by mouth daily. 03/28/19   Melene PlanFloyd, Dan,  DO  gabapentin (NEURONTIN) 600 MG tablet Take 600 mg by mouth 3 (three) times daily.    [provider]  hydrochlorothiazide (HYDRODIURIL) 25 MG tablet Take 25 mg by mouth daily.    [provider]  hydrOXYzine (VISTARIL) 25 MG capsule Take 25 mg by mouth every evening.    [provider]  ipratropium-albuterol (DUONEB) 0.5-2.5 (3) MG/3ML SOLN Take 3 mLs by nebulization 2 (two) times daily as needed (shortness of breath).    [provider]  lisinopril (ZESTRIL) 10 MG tablet Take 2 tablets (20 mg total) by mouth daily. 12/21/15   Florencia Reasons, MD  loratadine (CLARITIN) 10 MG tablet Take 10 mg by mouth daily.    [provider]  magnesium oxide (MAG-OX) 400 (241.3 Mg) MG tablet Take 400 mg by mouth daily.    [provider]  meclizine (ANTIVERT) 25 MG tablet Take 25 mg by mouth 3 (three) times daily as needed for dizziness.    [provider]  oxyCODONE-acetaminophen (PERCOCET/ROXICET) 5-325 MG tablet Take 1 tablet by mouth every 6 (six) hours as needed for severe  pain. 12/21/15   Florencia Reasons, MD  pantoprazole (PROTONIX) 40 MG tablet Take 40 mg by mouth daily.    [provider]  potassium chloride SA (K-DUR) 20 MEQ tablet Take 2 tablets (40 mEq total) by mouth daily for 5 days. 03/28/19 04/02/19  Deno Etienne, DO  traMADol (ULTRAM) 50 MG tablet Take 50 mg by mouth 3 (three) times daily as needed for moderate pain.    [provider]    Family History Family History  Problem Relation Age of Onset  . Heart disease Father 44       deceased  . Heart attack Father   . Hypertension Father   . Heart disease Mother 57       deceased  . Heart attack Mother   . Hypertension Mother   . Diabetes Mother   . Heart disease Brother        alive  . GI problems Sister        bleed  . Stroke Other     Social History Social History   Tobacco Use  . Smoking status: Former Smoker    Packs/day: 1.00    Years: 40.00    Pack years: 40.00    Types: Cigarettes  . Smokeless tobacco: Never Used  . Tobacco comment: "quit smoking in 2012"  Substance Use Topics  . Alcohol use: Not Currently    Comment: "quit drinking <~2012"  . Drug use: Not Currently    Types: Cocaine, Marijuana    Comment: He last tested positive for THC in 10-2007. Last tested positive for cocaine in 2007     Allergies   Amitriptyline hcl, Codeine, Iohexol, Ivp dye [iodinated diagnostic agents], and Nifedipine   Review of Systems Review of Systems  Constitutional: Negative for chills and fever.  Respiratory: Negative.   Cardiovascular: Negative.   Gastrointestinal: Negative.   Genitourinary:       See HPI.  Musculoskeletal: Positive for back pain.       See HPI.  Skin: Negative.   Neurological: Negative.  Negative for numbness.     Physical Exam Updated Vital Signs BP (!) 161/91 (BP Location: Right Arm)   Pulse 77   Temp 98.2 F (36.8 C) (Oral)   Resp 18   SpO2 97%   Physical Exam Vitals signs and nursing note reviewed.  Constitutional:      Appearance:  Normal appearance. He is well-developed.  Neck:     Musculoskeletal: Normal range of motion.  Pulmonary:     Effort: Pulmonary effort is normal.  Genitourinary:      Comments: There is tenderness from base of penis to perineal area. Scrotum is not swollen or red. No testicular tenderness.  Musculoskeletal: Normal range of motion.     Comments: Well healed midline surgical scars at cervical and lumbar spine. No swelling or redness. LE's have full movement with involuntary spastic motion with activity of the left leg. Distal pulses intact.   Skin:    General: Skin is warm and dry.  Neurological:     Mental Status: He is alert and oriented to person, place, and time.      ED Treatments / Results  Labs (all labs ordered are listed, but only abnormal results are displayed) Labs Reviewed  CBC WITH DIFFERENTIAL/PLATELET - Abnormal; Notable for the following components:      Result Value   RBC 3.69 (*)    Hemoglobin 11.4 (*)    HCT 34.6 (*)    All other components within normal limits  BASIC METABOLIC PANEL - Abnormal; Notable for the following components:   Glucose, Bld 111 (*)    Calcium 8.8 (*)    All other components within normal limits    EKG EKG Interpretation  Date/Time:  Friday September 14 2019 01:45:12 EST Ventricular Rate:  80 PR Interval:  190 QRS Duration: 156 QT Interval:  458 QTC Calculation: 528 R Axis:   128 Text Interpretation: Normal sinus rhythm Right bundle branch block Left posterior fascicular block Bifascicular block ** Cannot rule out Inferior infarct , age undetermined Abnormal ECG No significant change since last tracing Confirmed by Zadie Rhine ([id number]) on 09/14/2019 1:54:58 AM   Radiology Dg Hip Unilat W Or Wo Pelvis 2-3 Views Left  Result Date: 09/14/2019 CLINICAL DATA:  Fall.  Left groin pain, left thigh pain EXAM: DG HIP (WITH OR WITHOUT PELVIS) 2-3V LEFT COMPARISON:  None. FINDINGS: Postoperative changes in the lower lumbar spine. Hip  joints and SI joints are symmetric and unremarkable. No acute bony abnormality. Specifically, no fracture, subluxation, or dislocation. IMPRESSION: No acute bony abnormality. Electronically Signed   By: Charlett Nose M.D.   On: 09/14/2019 02:24    Procedures Procedures (including critical care time)  Medications Ordered in ED Medications  HYDROmorphone (DILAUDID) injection 1 mg (has no administration in time range)  diazepam (VALIUM) tablet 5 mg (has no administration in time range)     Initial Impression / Assessment and Plan / ED Course  I have reviewed the triage vital signs and the nursing notes.  Pertinent labs & imaging results that were available during my care of the patient were reviewed by me and considered in my medical decision making (see chart for details).        Patient to ED with complaint of groin pain on left from fall 2 days ago, and uncontrolled chronic low back and left LE pain after losing his oxycodone last week.   Imaging and labs studies reassuring. No pelvic fracture. Groin pain is likely muscular in nature. Back and leg symptoms are chronic and felt uncontrolled because he has lost his oxycodone.   IM Dilaudid and PO Valium provided. On recheck, the patient is sleeping soundly, easily awakened and reports his pain is better.   Discussed at length that a prescription is not available to him as he is under Pain Management. His next appointment  in 09/22/19. He does have an appointment with PCP today to replace other medications that were lost and he is encouraged to ask PCP to coordinate care with Pain Mgmt during office hours for a possible solution to his dilemma.   He has been seen by Dr. Nicanor Alcon and is felt he is appropriate for discharge.    Final Clinical Impressions(s) / ED Diagnoses   Final diagnoses:  None   1. Groin pain 2. Chronic back pain  ED Discharge Orders    None       Elpidio Anis, Cordelia Poche 09/14/19 [id number]    Palumbo, April, MD  09/14/19 [id number]

## 2019-09-14 NOTE — Discharge Instructions (Signed)
Keep your scheduled appointment today with your primary care doctor. You can discuss pain management with him/her and they can possible coordinate care of your uncontrolled pain with your Pain Management doctor.

## 2019-10-02 DIAGNOSIS — S61211A Laceration without foreign body of left index finger without damage to nail, initial encounter: Secondary | ICD-10-CM | POA: Diagnosis not present

## 2019-10-23 DIAGNOSIS — M545 Low back pain: Secondary | ICD-10-CM | POA: Diagnosis not present

## 2019-10-23 DIAGNOSIS — R42 Dizziness and giddiness: Secondary | ICD-10-CM | POA: Diagnosis not present

## 2019-10-23 DIAGNOSIS — M519 Unspecified thoracic, thoracolumbar and lumbosacral intervertebral disc disorder: Secondary | ICD-10-CM | POA: Diagnosis not present

## 2019-10-23 DIAGNOSIS — T8131XA Disruption of external operation (surgical) wound, not elsewhere classified, initial encounter: Secondary | ICD-10-CM | POA: Diagnosis not present

## 2019-10-23 DIAGNOSIS — M79645 Pain in left finger(s): Secondary | ICD-10-CM | POA: Diagnosis not present

## 2019-10-23 DIAGNOSIS — G894 Chronic pain syndrome: Secondary | ICD-10-CM | POA: Diagnosis not present

## 2019-10-23 DIAGNOSIS — S6992XA Unspecified injury of left wrist, hand and finger(s), initial encounter: Secondary | ICD-10-CM | POA: Diagnosis not present

## 2019-11-06 DIAGNOSIS — I110 Hypertensive heart disease with heart failure: Secondary | ICD-10-CM | POA: Diagnosis not present

## 2019-11-06 DIAGNOSIS — I5023 Acute on chronic systolic (congestive) heart failure: Secondary | ICD-10-CM | POA: Diagnosis not present

## 2019-11-06 DIAGNOSIS — I21A1 Myocardial infarction type 2: Secondary | ICD-10-CM | POA: Diagnosis not present

## 2019-11-07 DIAGNOSIS — F602 Antisocial personality disorder: Secondary | ICD-10-CM | POA: Diagnosis not present

## 2019-11-07 DIAGNOSIS — Z79899 Other long term (current) drug therapy: Secondary | ICD-10-CM | POA: Diagnosis not present

## 2019-11-07 DIAGNOSIS — I517 Cardiomegaly: Secondary | ICD-10-CM | POA: Diagnosis not present

## 2019-11-07 DIAGNOSIS — Z7951 Long term (current) use of inhaled steroids: Secondary | ICD-10-CM | POA: Diagnosis not present

## 2019-11-07 DIAGNOSIS — R7989 Other specified abnormal findings of blood chemistry: Secondary | ICD-10-CM | POA: Diagnosis not present

## 2019-11-07 DIAGNOSIS — E876 Hypokalemia: Secondary | ICD-10-CM | POA: Diagnosis not present

## 2019-11-07 DIAGNOSIS — Z7902 Long term (current) use of antithrombotics/antiplatelets: Secondary | ICD-10-CM | POA: Diagnosis not present

## 2019-11-07 DIAGNOSIS — I110 Hypertensive heart disease with heart failure: Secondary | ICD-10-CM | POA: Diagnosis not present

## 2019-11-07 DIAGNOSIS — I493 Ventricular premature depolarization: Secondary | ICD-10-CM | POA: Diagnosis not present

## 2019-11-07 DIAGNOSIS — M419 Scoliosis, unspecified: Secondary | ICD-10-CM | POA: Diagnosis not present

## 2019-11-07 DIAGNOSIS — J449 Chronic obstructive pulmonary disease, unspecified: Secondary | ICD-10-CM | POA: Diagnosis not present

## 2019-11-07 DIAGNOSIS — Z951 Presence of aortocoronary bypass graft: Secondary | ICD-10-CM | POA: Diagnosis not present

## 2019-11-07 DIAGNOSIS — Z91041 Radiographic dye allergy status: Secondary | ICD-10-CM | POA: Diagnosis not present

## 2019-11-07 DIAGNOSIS — Z8673 Personal history of transient ischemic attack (TIA), and cerebral infarction without residual deficits: Secondary | ICD-10-CM | POA: Diagnosis not present

## 2019-11-07 DIAGNOSIS — Z981 Arthrodesis status: Secondary | ICD-10-CM | POA: Diagnosis not present

## 2019-11-07 DIAGNOSIS — R0600 Dyspnea, unspecified: Secondary | ICD-10-CM | POA: Diagnosis not present

## 2019-11-07 DIAGNOSIS — G8929 Other chronic pain: Secondary | ICD-10-CM | POA: Diagnosis not present

## 2019-11-07 DIAGNOSIS — Z95810 Presence of automatic (implantable) cardiac defibrillator: Secondary | ICD-10-CM | POA: Diagnosis not present

## 2019-11-07 DIAGNOSIS — I5023 Acute on chronic systolic (congestive) heart failure: Secondary | ICD-10-CM | POA: Diagnosis not present

## 2019-11-07 DIAGNOSIS — I21A1 Myocardial infarction type 2: Secondary | ICD-10-CM | POA: Diagnosis not present

## 2019-11-07 DIAGNOSIS — M1990 Unspecified osteoarthritis, unspecified site: Secondary | ICD-10-CM | POA: Diagnosis not present

## 2019-11-07 DIAGNOSIS — F209 Schizophrenia, unspecified: Secondary | ICD-10-CM | POA: Diagnosis not present

## 2019-11-07 DIAGNOSIS — J811 Chronic pulmonary edema: Secondary | ICD-10-CM | POA: Diagnosis not present

## 2019-11-07 DIAGNOSIS — R079 Chest pain, unspecified: Secondary | ICD-10-CM | POA: Diagnosis not present

## 2019-11-07 DIAGNOSIS — I452 Bifascicular block: Secondary | ICD-10-CM | POA: Diagnosis not present

## 2019-11-07 DIAGNOSIS — K219 Gastro-esophageal reflux disease without esophagitis: Secondary | ICD-10-CM | POA: Diagnosis not present

## 2019-11-07 DIAGNOSIS — I255 Ischemic cardiomyopathy: Secondary | ICD-10-CM | POA: Diagnosis not present

## 2019-11-07 DIAGNOSIS — M545 Low back pain: Secondary | ICD-10-CM | POA: Diagnosis not present

## 2019-11-07 DIAGNOSIS — I491 Atrial premature depolarization: Secondary | ICD-10-CM | POA: Diagnosis not present

## 2019-11-07 DIAGNOSIS — I2510 Atherosclerotic heart disease of native coronary artery without angina pectoris: Secondary | ICD-10-CM | POA: Diagnosis not present

## 2019-11-07 DIAGNOSIS — F17200 Nicotine dependence, unspecified, uncomplicated: Secondary | ICD-10-CM | POA: Diagnosis not present

## 2019-11-07 DIAGNOSIS — Z7982 Long term (current) use of aspirin: Secondary | ICD-10-CM | POA: Diagnosis not present

## 2019-11-07 DIAGNOSIS — F319 Bipolar disorder, unspecified: Secondary | ICD-10-CM | POA: Diagnosis not present

## 2019-11-07 DIAGNOSIS — Z20822 Contact with and (suspected) exposure to covid-19: Secondary | ICD-10-CM | POA: Diagnosis not present

## 2019-11-08 DIAGNOSIS — R600 Localized edema: Secondary | ICD-10-CM | POA: Diagnosis not present

## 2019-11-08 DIAGNOSIS — I509 Heart failure, unspecified: Secondary | ICD-10-CM | POA: Diagnosis not present

## 2019-11-08 DIAGNOSIS — R0789 Other chest pain: Secondary | ICD-10-CM | POA: Diagnosis not present

## 2019-11-08 DIAGNOSIS — I110 Hypertensive heart disease with heart failure: Secondary | ICD-10-CM | POA: Diagnosis not present

## 2019-11-08 DIAGNOSIS — R0602 Shortness of breath: Secondary | ICD-10-CM | POA: Diagnosis not present

## 2019-11-08 DIAGNOSIS — R079 Chest pain, unspecified: Secondary | ICD-10-CM | POA: Diagnosis not present

## 2019-11-08 DIAGNOSIS — Z9889 Other specified postprocedural states: Secondary | ICD-10-CM | POA: Diagnosis not present

## 2019-11-08 DIAGNOSIS — Z720 Tobacco use: Secondary | ICD-10-CM | POA: Diagnosis not present

## 2019-12-06 DIAGNOSIS — J449 Chronic obstructive pulmonary disease, unspecified: Secondary | ICD-10-CM | POA: Diagnosis not present

## 2019-12-06 DIAGNOSIS — I255 Ischemic cardiomyopathy: Secondary | ICD-10-CM | POA: Diagnosis not present

## 2019-12-06 DIAGNOSIS — Z720 Tobacco use: Secondary | ICD-10-CM | POA: Diagnosis not present

## 2019-12-06 DIAGNOSIS — M549 Dorsalgia, unspecified: Secondary | ICD-10-CM | POA: Diagnosis not present

## 2019-12-06 DIAGNOSIS — M79604 Pain in right leg: Secondary | ICD-10-CM | POA: Diagnosis not present

## 2019-12-06 DIAGNOSIS — R102 Pelvic and perineal pain: Secondary | ICD-10-CM | POA: Diagnosis not present

## 2019-12-06 DIAGNOSIS — M7122 Synovial cyst of popliteal space [Baker], left knee: Secondary | ICD-10-CM | POA: Diagnosis not present

## 2019-12-06 DIAGNOSIS — R1030 Lower abdominal pain, unspecified: Secondary | ICD-10-CM | POA: Diagnosis not present

## 2019-12-06 DIAGNOSIS — M79605 Pain in left leg: Secondary | ICD-10-CM | POA: Diagnosis not present

## 2019-12-06 DIAGNOSIS — M7989 Other specified soft tissue disorders: Secondary | ICD-10-CM | POA: Diagnosis not present

## 2019-12-06 DIAGNOSIS — I119 Hypertensive heart disease without heart failure: Secondary | ICD-10-CM | POA: Diagnosis not present

## 2019-12-06 DIAGNOSIS — Z951 Presence of aortocoronary bypass graft: Secondary | ICD-10-CM | POA: Diagnosis not present

## 2019-12-07 DIAGNOSIS — M79645 Pain in left finger(s): Secondary | ICD-10-CM | POA: Diagnosis not present

## 2019-12-07 DIAGNOSIS — Z9889 Other specified postprocedural states: Secondary | ICD-10-CM | POA: Diagnosis not present

## 2019-12-07 DIAGNOSIS — S66111A Strain of flexor muscle, fascia and tendon of left index finger at wrist and hand level, initial encounter: Secondary | ICD-10-CM | POA: Diagnosis not present

## 2019-12-08 DIAGNOSIS — R600 Localized edema: Secondary | ICD-10-CM | POA: Diagnosis not present

## 2019-12-08 DIAGNOSIS — J449 Chronic obstructive pulmonary disease, unspecified: Secondary | ICD-10-CM | POA: Diagnosis present

## 2019-12-08 DIAGNOSIS — Z951 Presence of aortocoronary bypass graft: Secondary | ICD-10-CM | POA: Diagnosis not present

## 2019-12-08 DIAGNOSIS — Z95810 Presence of automatic (implantable) cardiac defibrillator: Secondary | ICD-10-CM | POA: Diagnosis not present

## 2019-12-08 DIAGNOSIS — M961 Postlaminectomy syndrome, not elsewhere classified: Secondary | ICD-10-CM | POA: Diagnosis present

## 2019-12-08 DIAGNOSIS — I509 Heart failure, unspecified: Secondary | ICD-10-CM | POA: Diagnosis not present

## 2019-12-08 DIAGNOSIS — Z791 Long term (current) use of non-steroidal anti-inflammatories (NSAID): Secondary | ICD-10-CM | POA: Diagnosis not present

## 2019-12-08 DIAGNOSIS — I5023 Acute on chronic systolic (congestive) heart failure: Secondary | ICD-10-CM | POA: Diagnosis not present

## 2019-12-08 DIAGNOSIS — R0602 Shortness of breath: Secondary | ICD-10-CM | POA: Diagnosis not present

## 2019-12-08 DIAGNOSIS — G40909 Epilepsy, unspecified, not intractable, without status epilepticus: Secondary | ICD-10-CM | POA: Diagnosis not present

## 2019-12-08 DIAGNOSIS — M1990 Unspecified osteoarthritis, unspecified site: Secondary | ICD-10-CM | POA: Diagnosis present

## 2019-12-08 DIAGNOSIS — Z7951 Long term (current) use of inhaled steroids: Secondary | ICD-10-CM | POA: Diagnosis not present

## 2019-12-08 DIAGNOSIS — Z91041 Radiographic dye allergy status: Secondary | ICD-10-CM | POA: Diagnosis not present

## 2019-12-08 DIAGNOSIS — K219 Gastro-esophageal reflux disease without esophagitis: Secondary | ICD-10-CM | POA: Diagnosis present

## 2019-12-08 DIAGNOSIS — Z7982 Long term (current) use of aspirin: Secondary | ICD-10-CM | POA: Diagnosis not present

## 2019-12-08 DIAGNOSIS — F319 Bipolar disorder, unspecified: Secondary | ICD-10-CM | POA: Diagnosis present

## 2019-12-08 DIAGNOSIS — I5020 Unspecified systolic (congestive) heart failure: Secondary | ICD-10-CM | POA: Diagnosis not present

## 2019-12-08 DIAGNOSIS — I255 Ischemic cardiomyopathy: Secondary | ICD-10-CM | POA: Diagnosis not present

## 2019-12-08 DIAGNOSIS — I110 Hypertensive heart disease with heart failure: Secondary | ICD-10-CM | POA: Diagnosis not present

## 2019-12-08 DIAGNOSIS — I088 Other rheumatic multiple valve diseases: Secondary | ICD-10-CM | POA: Diagnosis present

## 2019-12-08 DIAGNOSIS — I2510 Atherosclerotic heart disease of native coronary artery without angina pectoris: Secondary | ICD-10-CM | POA: Diagnosis present

## 2019-12-08 DIAGNOSIS — Z79899 Other long term (current) drug therapy: Secondary | ICD-10-CM | POA: Diagnosis not present

## 2019-12-08 DIAGNOSIS — I25810 Atherosclerosis of coronary artery bypass graft(s) without angina pectoris: Secondary | ICD-10-CM | POA: Diagnosis not present

## 2019-12-08 DIAGNOSIS — Z8673 Personal history of transient ischemic attack (TIA), and cerebral infarction without residual deficits: Secondary | ICD-10-CM | POA: Diagnosis not present

## 2019-12-08 DIAGNOSIS — R109 Unspecified abdominal pain: Secondary | ICD-10-CM | POA: Diagnosis not present

## 2019-12-08 DIAGNOSIS — F602 Antisocial personality disorder: Secondary | ICD-10-CM | POA: Diagnosis present

## 2019-12-08 DIAGNOSIS — R918 Other nonspecific abnormal finding of lung field: Secondary | ICD-10-CM | POA: Diagnosis not present

## 2019-12-08 DIAGNOSIS — Z20822 Contact with and (suspected) exposure to covid-19: Secondary | ICD-10-CM | POA: Diagnosis not present

## 2019-12-08 DIAGNOSIS — F17200 Nicotine dependence, unspecified, uncomplicated: Secondary | ICD-10-CM | POA: Diagnosis present

## 2019-12-08 DIAGNOSIS — F209 Schizophrenia, unspecified: Secondary | ICD-10-CM | POA: Diagnosis present

## 2019-12-08 DIAGNOSIS — Z7902 Long term (current) use of antithrombotics/antiplatelets: Secondary | ICD-10-CM | POA: Diagnosis not present

## 2019-12-08 DIAGNOSIS — I272 Pulmonary hypertension, unspecified: Secondary | ICD-10-CM | POA: Diagnosis not present

## 2019-12-17 DIAGNOSIS — F1411 Cocaine abuse, in remission: Secondary | ICD-10-CM | POA: Diagnosis not present

## 2019-12-17 DIAGNOSIS — F314 Bipolar disorder, current episode depressed, severe, without psychotic features: Secondary | ICD-10-CM | POA: Diagnosis not present

## 2019-12-17 DIAGNOSIS — F1011 Alcohol abuse, in remission: Secondary | ICD-10-CM | POA: Diagnosis not present

## 2019-12-17 DIAGNOSIS — F419 Anxiety disorder, unspecified: Secondary | ICD-10-CM | POA: Diagnosis not present

## 2019-12-17 DIAGNOSIS — Z8659 Personal history of other mental and behavioral disorders: Secondary | ICD-10-CM | POA: Diagnosis not present

## 2019-12-17 DIAGNOSIS — G894 Chronic pain syndrome: Secondary | ICD-10-CM | POA: Diagnosis not present

## 2019-12-17 DIAGNOSIS — Z87828 Personal history of other (healed) physical injury and trauma: Secondary | ICD-10-CM | POA: Diagnosis not present

## 2019-12-24 DIAGNOSIS — I255 Ischemic cardiomyopathy: Secondary | ICD-10-CM | POA: Diagnosis not present

## 2019-12-24 DIAGNOSIS — M4714 Other spondylosis with myelopathy, thoracic region: Secondary | ICD-10-CM | POA: Diagnosis not present

## 2019-12-24 DIAGNOSIS — I25709 Atherosclerosis of coronary artery bypass graft(s), unspecified, with unspecified angina pectoris: Secondary | ICD-10-CM | POA: Diagnosis not present

## 2019-12-24 DIAGNOSIS — I25700 Atherosclerosis of coronary artery bypass graft(s), unspecified, with unstable angina pectoris: Secondary | ICD-10-CM | POA: Diagnosis not present

## 2019-12-24 DIAGNOSIS — I10 Essential (primary) hypertension: Secondary | ICD-10-CM | POA: Diagnosis not present

## 2019-12-24 DIAGNOSIS — G894 Chronic pain syndrome: Secondary | ICD-10-CM | POA: Diagnosis not present

## 2019-12-25 DIAGNOSIS — G9389 Other specified disorders of brain: Secondary | ICD-10-CM | POA: Diagnosis not present

## 2019-12-25 DIAGNOSIS — Z950 Presence of cardiac pacemaker: Secondary | ICD-10-CM | POA: Diagnosis not present

## 2019-12-25 DIAGNOSIS — S0093XA Contusion of unspecified part of head, initial encounter: Secondary | ICD-10-CM | POA: Diagnosis not present

## 2019-12-25 DIAGNOSIS — S299XXA Unspecified injury of thorax, initial encounter: Secondary | ICD-10-CM | POA: Diagnosis not present

## 2019-12-25 DIAGNOSIS — I2510 Atherosclerotic heart disease of native coronary artery without angina pectoris: Secondary | ICD-10-CM | POA: Diagnosis not present

## 2019-12-25 DIAGNOSIS — M25512 Pain in left shoulder: Secondary | ICD-10-CM | POA: Diagnosis not present

## 2019-12-25 DIAGNOSIS — R918 Other nonspecific abnormal finding of lung field: Secondary | ICD-10-CM | POA: Diagnosis not present

## 2019-12-25 DIAGNOSIS — Z888 Allergy status to other drugs, medicaments and biological substances status: Secondary | ICD-10-CM | POA: Diagnosis not present

## 2019-12-25 DIAGNOSIS — S4992XA Unspecified injury of left shoulder and upper arm, initial encounter: Secondary | ICD-10-CM | POA: Diagnosis not present

## 2019-12-25 DIAGNOSIS — Z20822 Contact with and (suspected) exposure to covid-19: Secondary | ICD-10-CM | POA: Diagnosis not present

## 2019-12-25 DIAGNOSIS — Z043 Encounter for examination and observation following other accident: Secondary | ICD-10-CM | POA: Diagnosis not present

## 2019-12-25 DIAGNOSIS — Z91041 Radiographic dye allergy status: Secondary | ICD-10-CM | POA: Diagnosis not present

## 2019-12-25 DIAGNOSIS — S0990XA Unspecified injury of head, initial encounter: Secondary | ICD-10-CM | POA: Diagnosis not present

## 2019-12-25 DIAGNOSIS — I493 Ventricular premature depolarization: Secondary | ICD-10-CM | POA: Diagnosis not present

## 2019-12-25 DIAGNOSIS — G8911 Acute pain due to trauma: Secondary | ICD-10-CM | POA: Diagnosis not present

## 2019-12-25 DIAGNOSIS — M549 Dorsalgia, unspecified: Secondary | ICD-10-CM | POA: Diagnosis not present

## 2019-12-26 DIAGNOSIS — M961 Postlaminectomy syndrome, not elsewhere classified: Secondary | ICD-10-CM | POA: Diagnosis not present

## 2019-12-31 DIAGNOSIS — M4714 Other spondylosis with myelopathy, thoracic region: Secondary | ICD-10-CM | POA: Diagnosis not present

## 2019-12-31 DIAGNOSIS — G894 Chronic pain syndrome: Secondary | ICD-10-CM | POA: Diagnosis not present

## 2019-12-31 DIAGNOSIS — M961 Postlaminectomy syndrome, not elsewhere classified: Secondary | ICD-10-CM | POA: Diagnosis not present

## 2020-01-01 DIAGNOSIS — Z7901 Long term (current) use of anticoagulants: Secondary | ICD-10-CM | POA: Diagnosis not present

## 2020-01-01 DIAGNOSIS — Z720 Tobacco use: Secondary | ICD-10-CM | POA: Diagnosis not present

## 2020-01-01 DIAGNOSIS — S0003XA Contusion of scalp, initial encounter: Secondary | ICD-10-CM | POA: Diagnosis not present

## 2020-01-01 DIAGNOSIS — Z8673 Personal history of transient ischemic attack (TIA), and cerebral infarction without residual deficits: Secondary | ICD-10-CM | POA: Diagnosis not present

## 2020-01-01 DIAGNOSIS — S0990XA Unspecified injury of head, initial encounter: Secondary | ICD-10-CM | POA: Diagnosis not present

## 2020-01-01 DIAGNOSIS — S098XXA Other specified injuries of head, initial encounter: Secondary | ICD-10-CM | POA: Diagnosis not present

## 2020-01-01 DIAGNOSIS — I10 Essential (primary) hypertension: Secondary | ICD-10-CM | POA: Diagnosis not present

## 2020-01-18 DIAGNOSIS — F39 Unspecified mood [affective] disorder: Secondary | ICD-10-CM | POA: Diagnosis not present

## 2020-01-18 DIAGNOSIS — Z79899 Other long term (current) drug therapy: Secondary | ICD-10-CM | POA: Diagnosis not present

## 2020-02-09 DIAGNOSIS — R309 Painful micturition, unspecified: Secondary | ICD-10-CM | POA: Diagnosis not present

## 2020-02-09 DIAGNOSIS — R1030 Lower abdominal pain, unspecified: Secondary | ICD-10-CM | POA: Diagnosis not present

## 2020-02-09 DIAGNOSIS — K4090 Unilateral inguinal hernia, without obstruction or gangrene, not specified as recurrent: Secondary | ICD-10-CM | POA: Diagnosis not present

## 2020-02-09 DIAGNOSIS — R1031 Right lower quadrant pain: Secondary | ICD-10-CM | POA: Diagnosis not present

## 2020-02-14 DIAGNOSIS — M961 Postlaminectomy syndrome, not elsewhere classified: Secondary | ICD-10-CM | POA: Diagnosis not present

## 2020-02-14 DIAGNOSIS — M4714 Other spondylosis with myelopathy, thoracic region: Secondary | ICD-10-CM | POA: Diagnosis not present

## 2020-02-14 DIAGNOSIS — G894 Chronic pain syndrome: Secondary | ICD-10-CM | POA: Diagnosis not present

## 2020-02-20 DIAGNOSIS — M25552 Pain in left hip: Secondary | ICD-10-CM | POA: Diagnosis not present

## 2020-02-20 DIAGNOSIS — S7002XA Contusion of left hip, initial encounter: Secondary | ICD-10-CM | POA: Diagnosis not present

## 2020-02-25 DIAGNOSIS — K4020 Bilateral inguinal hernia, without obstruction or gangrene, not specified as recurrent: Secondary | ICD-10-CM | POA: Diagnosis not present

## 2020-02-27 DIAGNOSIS — R188 Other ascites: Secondary | ICD-10-CM | POA: Diagnosis not present

## 2020-02-27 DIAGNOSIS — K4020 Bilateral inguinal hernia, without obstruction or gangrene, not specified as recurrent: Secondary | ICD-10-CM | POA: Diagnosis not present

## 2020-02-29 DIAGNOSIS — K4020 Bilateral inguinal hernia, without obstruction or gangrene, not specified as recurrent: Secondary | ICD-10-CM | POA: Diagnosis not present

## 2020-02-29 DIAGNOSIS — I10 Essential (primary) hypertension: Secondary | ICD-10-CM | POA: Diagnosis not present

## 2020-02-29 DIAGNOSIS — Z23 Encounter for immunization: Secondary | ICD-10-CM | POA: Diagnosis not present

## 2020-02-29 DIAGNOSIS — I2510 Atherosclerotic heart disease of native coronary artery without angina pectoris: Secondary | ICD-10-CM | POA: Diagnosis not present

## 2020-02-29 DIAGNOSIS — G40909 Epilepsy, unspecified, not intractable, without status epilepticus: Secondary | ICD-10-CM | POA: Diagnosis not present

## 2020-02-29 DIAGNOSIS — J918 Pleural effusion in other conditions classified elsewhere: Secondary | ICD-10-CM | POA: Diagnosis not present

## 2020-02-29 DIAGNOSIS — K439 Ventral hernia without obstruction or gangrene: Secondary | ICD-10-CM | POA: Diagnosis not present

## 2020-02-29 DIAGNOSIS — R188 Other ascites: Secondary | ICD-10-CM | POA: Diagnosis not present

## 2020-02-29 DIAGNOSIS — K219 Gastro-esophageal reflux disease without esophagitis: Secondary | ICD-10-CM | POA: Diagnosis not present

## 2020-02-29 DIAGNOSIS — N400 Enlarged prostate without lower urinary tract symptoms: Secondary | ICD-10-CM | POA: Diagnosis not present

## 2020-03-07 DIAGNOSIS — R188 Other ascites: Secondary | ICD-10-CM | POA: Diagnosis not present

## 2020-03-07 DIAGNOSIS — K4020 Bilateral inguinal hernia, without obstruction or gangrene, not specified as recurrent: Secondary | ICD-10-CM | POA: Diagnosis not present

## 2020-03-07 DIAGNOSIS — I10 Essential (primary) hypertension: Secondary | ICD-10-CM | POA: Diagnosis not present

## 2020-03-07 DIAGNOSIS — N400 Enlarged prostate without lower urinary tract symptoms: Secondary | ICD-10-CM | POA: Diagnosis not present

## 2020-03-07 DIAGNOSIS — I2510 Atherosclerotic heart disease of native coronary artery without angina pectoris: Secondary | ICD-10-CM | POA: Diagnosis not present

## 2020-03-13 DIAGNOSIS — Z8673 Personal history of transient ischemic attack (TIA), and cerebral infarction without residual deficits: Secondary | ICD-10-CM | POA: Diagnosis not present

## 2020-03-13 DIAGNOSIS — F319 Bipolar disorder, unspecified: Secondary | ICD-10-CM | POA: Diagnosis present

## 2020-03-13 DIAGNOSIS — F602 Antisocial personality disorder: Secondary | ICD-10-CM | POA: Diagnosis present

## 2020-03-13 DIAGNOSIS — Z7982 Long term (current) use of aspirin: Secondary | ICD-10-CM | POA: Diagnosis not present

## 2020-03-13 DIAGNOSIS — Z79891 Long term (current) use of opiate analgesic: Secondary | ICD-10-CM | POA: Diagnosis not present

## 2020-03-13 DIAGNOSIS — J449 Chronic obstructive pulmonary disease, unspecified: Secondary | ICD-10-CM | POA: Diagnosis present

## 2020-03-13 DIAGNOSIS — Z20822 Contact with and (suspected) exposure to covid-19: Secondary | ICD-10-CM | POA: Diagnosis present

## 2020-03-13 DIAGNOSIS — Z91041 Radiographic dye allergy status: Secondary | ICD-10-CM | POA: Diagnosis not present

## 2020-03-13 DIAGNOSIS — K219 Gastro-esophageal reflux disease without esophagitis: Secondary | ICD-10-CM | POA: Diagnosis present

## 2020-03-13 DIAGNOSIS — F259 Schizoaffective disorder, unspecified: Secondary | ICD-10-CM | POA: Diagnosis present

## 2020-03-13 DIAGNOSIS — F1111 Opioid abuse, in remission: Secondary | ICD-10-CM | POA: Diagnosis present

## 2020-03-13 DIAGNOSIS — Z951 Presence of aortocoronary bypass graft: Secondary | ICD-10-CM | POA: Diagnosis not present

## 2020-03-13 DIAGNOSIS — Z5329 Procedure and treatment not carried out because of patient's decision for other reasons: Secondary | ICD-10-CM | POA: Diagnosis not present

## 2020-03-13 DIAGNOSIS — Z7902 Long term (current) use of antithrombotics/antiplatelets: Secondary | ICD-10-CM | POA: Diagnosis not present

## 2020-03-13 DIAGNOSIS — Z9119 Patient's noncompliance with other medical treatment and regimen: Secondary | ICD-10-CM | POA: Diagnosis not present

## 2020-03-13 DIAGNOSIS — Z95810 Presence of automatic (implantable) cardiac defibrillator: Secondary | ICD-10-CM | POA: Diagnosis not present

## 2020-03-13 DIAGNOSIS — F17200 Nicotine dependence, unspecified, uncomplicated: Secondary | ICD-10-CM | POA: Diagnosis present

## 2020-03-13 DIAGNOSIS — R188 Other ascites: Secondary | ICD-10-CM | POA: Diagnosis not present

## 2020-03-13 DIAGNOSIS — Z4682 Encounter for fitting and adjustment of non-vascular catheter: Secondary | ICD-10-CM | POA: Diagnosis not present

## 2020-03-13 DIAGNOSIS — R1030 Lower abdominal pain, unspecified: Secondary | ICD-10-CM | POA: Diagnosis not present

## 2020-03-13 DIAGNOSIS — Z01810 Encounter for preprocedural cardiovascular examination: Secondary | ICD-10-CM | POA: Diagnosis not present

## 2020-03-13 DIAGNOSIS — N433 Hydrocele, unspecified: Secondary | ICD-10-CM | POA: Diagnosis not present

## 2020-03-13 DIAGNOSIS — Z888 Allergy status to other drugs, medicaments and biological substances status: Secondary | ICD-10-CM | POA: Diagnosis not present

## 2020-03-13 DIAGNOSIS — I5023 Acute on chronic systolic (congestive) heart failure: Secondary | ICD-10-CM | POA: Diagnosis not present

## 2020-03-13 DIAGNOSIS — K4000 Bilateral inguinal hernia, with obstruction, without gangrene, not specified as recurrent: Secondary | ICD-10-CM | POA: Diagnosis not present

## 2020-03-13 DIAGNOSIS — Z79899 Other long term (current) drug therapy: Secondary | ICD-10-CM | POA: Diagnosis not present

## 2020-03-13 DIAGNOSIS — I25708 Atherosclerosis of coronary artery bypass graft(s), unspecified, with other forms of angina pectoris: Secondary | ICD-10-CM | POA: Diagnosis present

## 2020-03-13 DIAGNOSIS — I110 Hypertensive heart disease with heart failure: Secondary | ICD-10-CM | POA: Diagnosis present

## 2020-03-13 DIAGNOSIS — R109 Unspecified abdominal pain: Secondary | ICD-10-CM | POA: Diagnosis not present

## 2020-03-13 DIAGNOSIS — E7800 Pure hypercholesterolemia, unspecified: Secondary | ICD-10-CM | POA: Diagnosis present

## 2020-03-13 DIAGNOSIS — N50819 Testicular pain, unspecified: Secondary | ICD-10-CM | POA: Diagnosis not present

## 2020-04-01 DIAGNOSIS — Z8673 Personal history of transient ischemic attack (TIA), and cerebral infarction without residual deficits: Secondary | ICD-10-CM | POA: Diagnosis not present

## 2020-04-01 DIAGNOSIS — Z91041 Radiographic dye allergy status: Secondary | ICD-10-CM | POA: Diagnosis not present

## 2020-04-01 DIAGNOSIS — R112 Nausea with vomiting, unspecified: Secondary | ICD-10-CM | POA: Diagnosis not present

## 2020-04-01 DIAGNOSIS — G911 Obstructive hydrocephalus: Secondary | ICD-10-CM | POA: Diagnosis not present

## 2020-04-01 DIAGNOSIS — Z91048 Other nonmedicinal substance allergy status: Secondary | ICD-10-CM | POA: Diagnosis not present

## 2020-04-01 DIAGNOSIS — Z888 Allergy status to other drugs, medicaments and biological substances status: Secondary | ICD-10-CM | POA: Diagnosis not present

## 2020-04-01 DIAGNOSIS — E785 Hyperlipidemia, unspecified: Secondary | ICD-10-CM | POA: Diagnosis present

## 2020-04-01 DIAGNOSIS — I619 Nontraumatic intracerebral hemorrhage, unspecified: Secondary | ICD-10-CM | POA: Diagnosis present

## 2020-04-01 DIAGNOSIS — Z66 Do not resuscitate: Secondary | ICD-10-CM | POA: Diagnosis present

## 2020-04-01 DIAGNOSIS — Z515 Encounter for palliative care: Secondary | ICD-10-CM | POA: Diagnosis present

## 2020-04-01 DIAGNOSIS — J9601 Acute respiratory failure with hypoxia: Secondary | ICD-10-CM | POA: Diagnosis present

## 2020-04-01 DIAGNOSIS — Z95810 Presence of automatic (implantable) cardiac defibrillator: Secondary | ICD-10-CM | POA: Diagnosis not present

## 2020-04-01 DIAGNOSIS — Z79899 Other long term (current) drug therapy: Secondary | ICD-10-CM | POA: Diagnosis not present

## 2020-04-01 DIAGNOSIS — I429 Cardiomyopathy, unspecified: Secondary | ICD-10-CM | POA: Diagnosis not present

## 2020-04-01 DIAGNOSIS — I10 Essential (primary) hypertension: Secondary | ICD-10-CM | POA: Diagnosis present

## 2020-04-01 DIAGNOSIS — K469 Unspecified abdominal hernia without obstruction or gangrene: Secondary | ICD-10-CM | POA: Diagnosis present

## 2020-04-10 DEATH — deceased
